# Patient Record
Sex: Female | Born: 1980 | Race: Black or African American | Hispanic: No | Smoking: Current every day smoker
Health system: Southern US, Community
[De-identification: ages and names within clinical notes are randomized; demographics above are authoritative.]

## PROBLEM LIST (undated history)

## (undated) DIAGNOSIS — F319 Bipolar disorder, unspecified: Secondary | ICD-10-CM

## (undated) DIAGNOSIS — I209 Angina pectoris, unspecified: Secondary | ICD-10-CM

## (undated) DIAGNOSIS — D219 Benign neoplasm of connective and other soft tissue, unspecified: Secondary | ICD-10-CM

## (undated) DIAGNOSIS — G43909 Migraine, unspecified, not intractable, without status migrainosus: Secondary | ICD-10-CM

## (undated) DIAGNOSIS — R7303 Prediabetes: Secondary | ICD-10-CM

## (undated) DIAGNOSIS — F329 Major depressive disorder, single episode, unspecified: Secondary | ICD-10-CM

## (undated) DIAGNOSIS — F32A Depression, unspecified: Secondary | ICD-10-CM

## (undated) DIAGNOSIS — R200 Anesthesia of skin: Secondary | ICD-10-CM

## (undated) DIAGNOSIS — R202 Paresthesia of skin: Secondary | ICD-10-CM

## (undated) DIAGNOSIS — L509 Urticaria, unspecified: Secondary | ICD-10-CM

## (undated) DIAGNOSIS — M797 Fibromyalgia: Secondary | ICD-10-CM

## (undated) HISTORY — DX: Prediabetes: R73.03

## (undated) HISTORY — DX: Anesthesia of skin: R20.2

## (undated) HISTORY — DX: Depression, unspecified: F32.A

## (undated) HISTORY — DX: Bipolar disorder, unspecified: F31.9

## (undated) HISTORY — DX: Benign neoplasm of connective and other soft tissue, unspecified: D21.9

## (undated) HISTORY — DX: Angina pectoris, unspecified: I20.9

## (undated) HISTORY — PX: OTHER SURGICAL HISTORY: SHX169

## (undated) HISTORY — DX: Major depressive disorder, single episode, unspecified: F32.9

## (undated) HISTORY — DX: Anesthesia of skin: R20.0

## (undated) HISTORY — DX: Urticaria, unspecified: L50.9

## (undated) HISTORY — PX: CARPAL TUNNEL RELEASE: SHX101

## (undated) HISTORY — DX: Migraine, unspecified, not intractable, without status migrainosus: G43.909

## (undated) HISTORY — DX: Fibromyalgia: M79.7

---

## 2013-04-23 LAB — OB RESULTS CONSOLE HIV ANTIBODY (ROUTINE TESTING): HIV: NONREACTIVE

## 2013-04-23 LAB — SICKLE CELL SCREEN: Sickle Cell Screen: NORMAL

## 2013-04-23 LAB — OB RESULTS CONSOLE GC/CHLAMYDIA
CHLAMYDIA, DNA PROBE: NEGATIVE
Gonorrhea: NEGATIVE

## 2013-04-23 LAB — OB RESULTS CONSOLE HGB/HCT, BLOOD
HCT: 35 %
Hemoglobin: 11.3 g/dL

## 2013-04-23 LAB — CYTOLOGY - PAP: Pap Smear: NORMAL

## 2013-04-23 LAB — OB RESULTS CONSOLE PLATELET COUNT: Platelets: 216 10*3/uL

## 2013-09-15 ENCOUNTER — Ambulatory Visit (INDEPENDENT_AMBULATORY_CARE_PROVIDER_SITE_OTHER): Payer: Self-pay | Admitting: Family Medicine

## 2013-09-15 ENCOUNTER — Encounter: Payer: Self-pay | Admitting: Obstetrics & Gynecology

## 2013-09-15 VITALS — BP 127/79 | Temp 97.8°F | Ht 60.0 in | Wt 157.7 lb

## 2013-09-15 DIAGNOSIS — O09893 Supervision of other high risk pregnancies, third trimester: Secondary | ICD-10-CM

## 2013-09-15 DIAGNOSIS — Z3490 Encounter for supervision of normal pregnancy, unspecified, unspecified trimester: Secondary | ICD-10-CM

## 2013-09-15 DIAGNOSIS — O09213 Supervision of pregnancy with history of pre-term labor, third trimester: Principal | ICD-10-CM

## 2013-09-15 DIAGNOSIS — Z3480 Encounter for supervision of other normal pregnancy, unspecified trimester: Secondary | ICD-10-CM

## 2013-09-15 DIAGNOSIS — O09219 Supervision of pregnancy with history of pre-term labor, unspecified trimester: Secondary | ICD-10-CM

## 2013-09-15 LAB — HIV ANTIBODY (ROUTINE TESTING W REFLEX): HIV: NONREACTIVE

## 2013-09-15 LAB — POCT URINALYSIS DIP (DEVICE)
Glucose, UA: NEGATIVE mg/dL
HGB URINE DIPSTICK: NEGATIVE
Nitrite: NEGATIVE
PH: 6 (ref 5.0–8.0)
Protein, ur: NEGATIVE mg/dL
Specific Gravity, Urine: >=1.03 (ref 1.005–1.030)
UROBILINOGEN UA: 1 mg/dL (ref 0.0–1.0)

## 2013-09-15 NOTE — Progress Notes (Signed)
  Subjective:    Tiffany Juarez is being seen today for her first obstetrical visit.She is a transfer of care from [facility] in [place].  Got care regularly there until 28 weeks when she moved.  No complications.  Does have a known uterine fibroid on her fundus.  This is a planned pregnancy. She is at [redacted]w[redacted]d gestation. Her obstetrical history is significant for PTD with first pregnancy at 32 weeks. She was induced at 32 weeks for severe pre-E. Relationship with FOB: significant other, not living together. Patient does intend to breast feed. Pregnancy history fully reviewed.  Patient reports left wrist pain. Feels like it is hard to pick up things. pain rigth at the base of her thumb. .  Review of Systems:   Review of Systems  see above   Objective:     BP 127/79  Temp(Src) 97.8 F (36.6 C)  Ht 5' (1.524 m)  Wt 157 lb 11.2 oz (71.532 kg)  BMI 30.80 kg/m2  LMP 02/03/2013 Physical Exam  Exam GENERAL: Well-developed, well-nourished female in no acute distress.  HEENT: Normocephalic, atraumatic. Sclerae anicteric.  NECK: Supple. Normal thyroid.  LUNGS: Clear to auscultation bilaterally.  HEART: Regular rate and rhythm. BREASTS: deferred ABDOMEN: Soft, nontender, nondistended. No organomegaly. PELVIC: deferred  EXTREMITIES: No cyanosis, clubbing, or edema, 2+ distal pulses. Left wrist- tender and mild swelling of brachioradialis.      Assessment:    Pregnancy: [id number] Patient Active Problem List   Diagnosis Date Noted  . History of preterm delivery, currently pregnant in third trimester 09/15/2013       Plan:     Records requested on 2/9 but still not here. Called clinic but had to leave a message. Hx of PTD but after induction for severe pre-E   Will order ob panel and 28 week labs.  US obtained previously so will wait to attempt to get records.   Wrist strain- recommend splinting  Prenatal vitamins. Problem list reviewed and updated.  Follow up in 2  weeks. 50% of 30 min visit spent on counseling and coordination of care.     Etsuko Dierolf L 09/15/2013

## 2013-09-15 NOTE — Patient Instructions (Signed)
Third Trimester of Pregnancy The third trimester is from week 29 through week 42, months 7 through 9. The third trimester is a time when the fetus is growing rapidly. At the end of the ninth month, the fetus is about 20 inches in length and weighs 6 10 pounds.  BODY CHANGES Your body goes through many changes during pregnancy. The changes vary from woman to woman.   Your weight will continue to increase. You can expect to gain 25 35 pounds (11 16 kg) by the end of the pregnancy.  You may begin to get stretch marks on your hips, abdomen, and breasts.  You may urinate more often because the fetus is moving lower into your pelvis and pressing on your bladder.  You may develop or continue to have heartburn as a result of your pregnancy.  You may develop constipation because certain hormones are causing the muscles that push waste through your intestines to slow down.  You may develop hemorrhoids or swollen, bulging veins (varicose veins).  You may have pelvic pain because of the weight gain and pregnancy hormones relaxing your joints between the bones in your pelvis. Back aches may result from over exertion of the muscles supporting your posture.  Your breasts will continue to grow and be tender. A yellow discharge may leak from your breasts called colostrum.  Your belly button may stick out.  You may feel short of breath because of your expanding uterus.  You may notice the fetus "dropping," or moving lower in your abdomen.  You may have a bloody mucus discharge. This usually occurs a few days to a week before labor begins.  Your cervix becomes thin and soft (effaced) near your due date. WHAT TO EXPECT AT YOUR PRENATAL EXAMS  You will have prenatal exams every 2 weeks until week 36. Then, you will have weekly prenatal exams. During a routine prenatal visit:  You will be weighed to make sure you and the fetus are growing normally.  Your blood pressure is taken.  Your abdomen will be  measured to track your baby's growth.  The fetal heartbeat will be listened to.  Any test results from the previous visit will be discussed.  You may have a cervical check near your due date to see if you have effaced. At around 36 weeks, your caregiver will check your cervix. At the same time, your caregiver will also perform a test on the secretions of the vaginal tissue. This test is to determine if a type of bacteria, Group B streptococcus, is present. Your caregiver will explain this further. Your caregiver may ask you:  What your birth plan is.  How you are feeling.  If you are feeling the baby move.  If you have had any abnormal symptoms, such as leaking fluid, bleeding, severe headaches, or abdominal cramping.  If you have any questions. Other tests or screenings that may be performed during your third trimester include:  Blood tests that check for low iron levels (anemia).  Fetal testing to check the health, activity level, and growth of the fetus. Testing is done if you have certain medical conditions or if there are problems during the pregnancy. FALSE LABOR You may feel small, irregular contractions that eventually go away. These are called Braxton Hicks contractions, or false labor. Contractions may last for hours, days, or even weeks before true labor sets in. If contractions come at regular intervals, intensify, or become painful, it is best to be seen by your caregiver.  SIGNS OF LABOR   Menstrual-like cramps.  Contractions that are 5 minutes apart or less.  Contractions that start on the top of the uterus and spread down to the lower abdomen and back.  A sense of increased pelvic pressure or back pain.  A watery or bloody mucus discharge that comes from the vagina. If you have any of these signs before the 37th week of pregnancy, call your caregiver right away. You need to go to the hospital to get checked immediately. HOME CARE INSTRUCTIONS   Avoid all  smoking, herbs, alcohol, and unprescribed drugs. These chemicals affect the formation and growth of the baby.  Follow your caregiver's instructions regarding medicine use. There are medicines that are either safe or unsafe to take during pregnancy.  Exercise only as directed by your caregiver. Experiencing uterine cramps is a good sign to stop exercising.  Continue to eat regular, healthy meals.  Wear a good support bra for breast tenderness.  Do not use hot tubs, steam rooms, or saunas.  Wear your seat belt at all times when driving.  Avoid raw meat, uncooked cheese, cat litter boxes, and soil used by cats. These carry germs that can cause birth defects in the baby.  Take your prenatal vitamins.  Try taking a stool softener (if your caregiver approves) if you develop constipation. Eat more high-fiber foods, such as fresh vegetables or fruit and whole grains. Drink plenty of fluids to keep your urine clear or pale yellow.  Take warm sitz baths to soothe any pain or discomfort caused by hemorrhoids. Use hemorrhoid cream if your caregiver approves.  If you develop varicose veins, wear support hose. Elevate your feet for 15 minutes, 3 4 times a day. Limit salt in your diet.  Avoid heavy lifting, wear low heal shoes, and practice good posture.  Rest a lot with your legs elevated if you have leg cramps or low back pain.  Visit your dentist if you have not gone during your pregnancy. Use a soft toothbrush to brush your teeth and be gentle when you floss.  A sexual relationship may be continued unless your caregiver directs you otherwise.  Do not travel far distances unless it is absolutely necessary and only with the approval of your caregiver.  Take prenatal classes to understand, practice, and ask questions about the labor and delivery.  Make a trial run to the hospital.  Pack your hospital bag.  Prepare the baby's nursery.  Continue to go to all your prenatal visits as directed  by your caregiver. SEEK MEDICAL CARE IF:  You are unsure if you are in labor or if your water has broken.  You have dizziness.  You have mild pelvic cramps, pelvic pressure, or nagging pain in your abdominal area.  You have persistent nausea, vomiting, or diarrhea.  You have a bad smelling vaginal discharge.  You have pain with urination. SEEK IMMEDIATE MEDICAL CARE IF:   You have a fever.  You are leaking fluid from your vagina.  You have spotting or bleeding from your vagina.  You have severe abdominal cramping or pain.  You have rapid weight loss or gain.  You have shortness of breath with chest pain.  You notice sudden or extreme swelling of your face, hands, ankles, feet, or legs.  You have not felt your baby move in over an hour.  You have severe headaches that do not go away with medicine.  You have vision changes. Document Released: 07/03/2001 Document Revised: 03/11/2013 Document Reviewed:   You have severe abdominal cramping or pain.   You have rapid weight loss or gain.   You have shortness of breath with chest pain.   You notice sudden or extreme swelling of your face, hands, ankles, feet, or legs.   You have not felt your baby move in over an hour.   You have severe headaches that do not go away with medicine.   You have vision changes.  Document Released: 07/03/2001 Document Revised: 03/11/2013 Document Reviewed: 09/09/2012  [employer] Patient Information 2014 [employer].

## 2013-09-15 NOTE — Progress Notes (Signed)
P=92  Initial OB transfer from [place], records requested.

## 2013-09-16 LAB — OBSTETRIC PANEL
Antibody Screen: NEGATIVE
BASOS PCT: 0 % (ref 0–1)
Basophils Absolute: 0 10*3/uL (ref 0.0–0.1)
EOS PCT: 2 % (ref 0–5)
Eosinophils Absolute: 0.2 10*3/uL (ref 0.0–0.7)
HCT: 32.7 % — ABNORMAL LOW (ref 36.0–46.0)
HEP B S AG: NEGATIVE
Hemoglobin: 11.1 g/dL — ABNORMAL LOW (ref 12.0–15.0)
LYMPHS ABS: 1.7 10*3/uL (ref 0.7–4.0)
Lymphocytes Relative: 17 % (ref 12–46)
MCH: 28.2 pg (ref 26.0–34.0)
MCHC: 33.9 g/dL (ref 30.0–36.0)
MCV: 83 fL (ref 78.0–100.0)
MONOS PCT: 6 % (ref 3–12)
Monocytes Absolute: 0.6 10*3/uL (ref 0.1–1.0)
Neutro Abs: 7.6 10*3/uL (ref 1.7–7.7)
Neutrophils Relative %: 75 % (ref 43–77)
PLATELETS: 177 10*3/uL (ref 150–400)
RBC: 3.94 MIL/uL (ref 3.87–5.11)
RDW: 14.7 % (ref 11.5–15.5)
RH TYPE: POSITIVE
Rubella: 1.6 Index — ABNORMAL HIGH (ref <0.90)
WBC: 10.1 10*3/uL (ref 4.0–10.5)

## 2013-09-16 LAB — PRESCRIPTION MONITORING PROFILE (19 PANEL)
Amphetamine/Meth: NEGATIVE ng/mL
Barbiturate Screen, Urine: NEGATIVE ng/mL
Benzodiazepine Screen, Urine: NEGATIVE ng/mL
Buprenorphine, Urine: NEGATIVE ng/mL
CARISOPRODOL, URINE: NEGATIVE ng/mL
CREATININE, URINE: 264.26 mg/dL (ref >20.0)
Cannabinoid Scrn, Ur: NEGATIVE ng/mL
Cocaine Metabolites: NEGATIVE ng/mL
Fentanyl, Ur: NEGATIVE ng/mL
MDMA URINE: NEGATIVE ng/mL
MEPERIDINE UR: NEGATIVE ng/mL
Methadone Screen, Urine: NEGATIVE ng/mL
Methaqualone: NEGATIVE ng/mL
NITRITES URINE, INITIAL: NEGATIVE ug/mL
Opiate Screen, Urine: NEGATIVE ng/mL
Oxycodone Screen, Ur: NEGATIVE ng/mL
PH URINE, INITIAL: 6 pH (ref 4.5–8.9)
PROPOXYPHENE: NEGATIVE ng/mL
Phencyclidine, Ur: NEGATIVE ng/mL
TRAMADOL UR: NEGATIVE ng/mL
Tapentadol, urine: NEGATIVE ng/mL
Zolpidem, Urine: NEGATIVE ng/mL

## 2013-09-16 LAB — GLUCOSE TOLERANCE, 1 HOUR (50G) W/O FASTING: GLUCOSE 1 HOUR GTT: 118 mg/dL (ref 70–140)

## 2013-09-18 ENCOUNTER — Encounter: Payer: Self-pay | Admitting: Family Medicine

## 2013-09-21 ENCOUNTER — Encounter: Payer: Self-pay | Admitting: *Deleted

## 2013-09-22 ENCOUNTER — Encounter: Payer: Self-pay | Admitting: Obstetrics & Gynecology

## 2013-09-29 ENCOUNTER — Encounter: Payer: Self-pay | Admitting: Obstetrics & Gynecology

## 2013-09-29 ENCOUNTER — Encounter: Payer: Self-pay | Admitting: *Deleted

## 2013-12-16 ENCOUNTER — Encounter: Payer: Self-pay | Admitting: General Practice

## 2014-05-24 ENCOUNTER — Encounter: Payer: Self-pay | Admitting: Obstetrics & Gynecology

## 2014-07-21 ENCOUNTER — Encounter (HOSPITAL_COMMUNITY): Payer: Self-pay | Admitting: *Deleted

## 2016-08-14 ENCOUNTER — Emergency Department (HOSPITAL_COMMUNITY)
Admission: EM | Admit: 2016-08-14 | Discharge: 2016-08-14 | Disposition: A | Discharge status: Home / Self Care | Payer: Self-pay | Attending: Dermatology | Admitting: Dermatology

## 2016-08-14 ENCOUNTER — Encounter (HOSPITAL_COMMUNITY): Payer: Self-pay | Admitting: Emergency Medicine

## 2016-08-14 DIAGNOSIS — F17210 Nicotine dependence, cigarettes, uncomplicated: Secondary | ICD-10-CM | POA: Insufficient documentation

## 2016-08-14 DIAGNOSIS — J029 Acute pharyngitis, unspecified: Secondary | ICD-10-CM | POA: Insufficient documentation

## 2016-08-14 DIAGNOSIS — Z5321 Procedure and treatment not carried out due to patient leaving prior to being seen by health care provider: Secondary | ICD-10-CM | POA: Insufficient documentation

## 2016-08-14 NOTE — ED Notes (Signed)
Offered patient ibuprofen.  Patient states she just took 800mg  at home before arrival.

## 2016-08-14 NOTE — ED Triage Notes (Signed)
Pt presents to ED for assessment of multiple symptoms x 1 month:  Neck pain, right and left shoulder pain, throat pain, bilateral ear pain, intermittent "hive" type skin irritation around belt area, random severe itching on palms or soles of feet, and generalized skin soreness.  Pt states difficulty sleeping due to pain.

## 2016-09-02 ENCOUNTER — Emergency Department (HOSPITAL_COMMUNITY)
Admission: EM | Admit: 2016-09-02 | Discharge: 2016-09-02 | Disposition: A | Discharge status: Home / Self Care | Payer: Self-pay | Attending: Dermatology | Admitting: Dermatology

## 2016-09-02 ENCOUNTER — Encounter (HOSPITAL_COMMUNITY): Payer: Self-pay | Admitting: Emergency Medicine

## 2016-09-02 DIAGNOSIS — M79672 Pain in left foot: Secondary | ICD-10-CM | POA: Insufficient documentation

## 2016-09-02 DIAGNOSIS — F17210 Nicotine dependence, cigarettes, uncomplicated: Secondary | ICD-10-CM | POA: Insufficient documentation

## 2016-09-02 DIAGNOSIS — M79671 Pain in right foot: Secondary | ICD-10-CM | POA: Insufficient documentation

## 2016-09-02 DIAGNOSIS — Z5321 Procedure and treatment not carried out due to patient leaving prior to being seen by health care provider: Secondary | ICD-10-CM | POA: Insufficient documentation

## 2016-09-02 NOTE — ED Triage Notes (Signed)
Pt c/o knots on bil feet off and on x's 2 months.  St's they come and go.  Pt c/o pain to these areas.  St's painful to walk

## 2016-09-02 NOTE — ED Notes (Signed)
Patient inquiring about wait time. Updated on current wait time. RN also examined feet, they are warm to touch. Unable to palpate pulses because patient states it hurt too bad for her feet to be touched.

## 2016-09-02 NOTE — ED Notes (Signed)
Pt at desk stating she is going to leave and go to another ED. VSS and patient alert and able to make health decisions.

## 2016-09-11 ENCOUNTER — Encounter (HOSPITAL_COMMUNITY): Payer: Self-pay | Admitting: Emergency Medicine

## 2016-09-11 ENCOUNTER — Ambulatory Visit (HOSPITAL_COMMUNITY)
Admission: EM | Admit: 2016-09-11 | Discharge: 2016-09-11 | Disposition: A | Discharge status: Home / Self Care | Payer: Self-pay | Attending: Family Medicine | Admitting: Family Medicine

## 2016-09-11 DIAGNOSIS — R229 Localized swelling, mass and lump, unspecified: Secondary | ICD-10-CM

## 2016-09-11 DIAGNOSIS — L509 Urticaria, unspecified: Secondary | ICD-10-CM

## 2016-09-11 DIAGNOSIS — M2550 Pain in unspecified joint: Secondary | ICD-10-CM

## 2016-09-11 LAB — POCT URINALYSIS DIP (DEVICE)
BILIRUBIN URINE: NEGATIVE
GLUCOSE, UA: NEGATIVE mg/dL
HGB URINE DIPSTICK: NEGATIVE
Ketones, ur: NEGATIVE mg/dL
LEUKOCYTES UA: NEGATIVE
NITRITE: NEGATIVE
Protein, ur: NEGATIVE mg/dL
Specific Gravity, Urine: 1.015 (ref 1.005–1.030)
UROBILINOGEN UA: 0.2 mg/dL (ref 0.0–1.0)
pH: 7 (ref 5.0–8.0)

## 2016-09-11 MED ORDER — PREDNISONE 50 MG PO TABS: ORAL_TABLET | ORAL | 0 refills | Status: DC

## 2016-09-11 NOTE — Discharge Instructions (Signed)
Its not certain as to what the specific reason is for some your symptoms, rash and painful nodules. I am concerned about a possible autoimmune condition. You need to have specific testing that we do not have available at this facility. Please follow-up with the urgent care and which you have been before and that had these types of testing and primary care soon as possible. You had been given a prescription for steroids today and would recommend taking an over-the-counter nonsedating antihistamine such as Zyrtec or Allegra.

## 2016-09-11 NOTE — ED Provider Notes (Signed)
CSN: [id number]     Arrival date & time 09/11/16  1011 History   First MD Initiated Contact with Patient 09/11/16 1116     Chief Complaint  Patient presents with  . Urticaria  . Back Pain   (Consider location/radiation/quality/duration/timing/severity/associated sxs/prior Treatment) 36 year old female who has been dealing with a type of rash where she describes as "whelps", red raised wheals that often form tender, painful "knots" under the skin. She states some are flat and raised, appearing in one area and then disappearing and then reappearing in another. She has 2 or 3 areas now with air is a subcutaneous nodular-like area with surrounding tenderness. One is to the left upper most buttock and the other is to the right heel. She also develops periodic joint pains/ polyarthralgias, myalgias. She states this started in her hands a few months ago where she was developed scaling erythematous plaques and nodules. They were painful. She was treated with topical steroids that did not help at that time.      Past Medical History:  Diagnosis Date  . Anginal pain ([place])   . Fibroid    Past Surgical History:  Procedure Laterality Date  . CESAREAN SECTION     Family History  Problem Relation Age of Onset  . Depression Father   . Depression Brother    Social History  Substance Use Topics  . Smoking status: Current Every Day Smoker    Packs/day: 0.50    Types: Cigarettes  . Smokeless tobacco: Never Used  . Alcohol use No   OB History    Gravida Para Term Preterm AB Living   5 2 1 1 2 2    SAB TAB Ectopic Multiple Live Births   1 1     2      Review of Systems  Constitutional: Positive for activity change. Negative for fever.  HENT: Negative.   Eyes: Negative.   Respiratory: Negative.   Cardiovascular: Negative for chest pain and leg swelling.  Gastrointestinal: Negative.   Endocrine: Negative.   Genitourinary: Negative for dysuria, frequency, pelvic pain and vaginal discharge.   Musculoskeletal: Positive for arthralgias and myalgias.  Skin: Positive for rash.  Neurological: Negative.   Psychiatric/Behavioral: Negative.   All other systems reviewed and are negative.   Allergies  Cephalosporins and Penicillins  Home Medications   Prior to Admission medications   Medication Sig Start Date End Date Taking? Authorizing Provider  predniSONE (DELTASONE) 50 MG tablet 1 tab po daily for 6 days. Take with food. 09/11/16   Janne Napoleon, NP   Meds Ordered and Administered this Visit  Medications - No data to display  BP 107/62 (BP Location: Right Arm)   Pulse 74   Temp 98.5 F (36.9 C) (Oral)   Resp 18   LMP 09/11/2016 (Exact Date)   SpO2 100%   Breastfeeding? No  No data found.   Physical Exam  Constitutional: She is oriented to person, place, and time. She appears well-developed and well-nourished. No distress.  HENT:  Head: Normocephalic and atraumatic.  Eyes: Conjunctivae and EOM are normal.  Neck: Normal range of motion. Neck supple.  Cardiovascular: Normal rate and regular rhythm.   Pulmonary/Chest: Effort normal. No respiratory distress.  Musculoskeletal: Normal range of motion. She exhibits no edema.  There is a small tender barely visible lesion to the heel of the right foot there is tenderness along the edge of the heel and just distal to the Achilles tendon. Minimal erythema and a small area does not  appear to be cellulitis.  There is a small tender, annular, lesion to the left upper most buttock adjacent to the cleft which is also TTP. No erythema. No muscular or bony tenderness.  Lymphadenopathy:    She has no cervical adenopathy.  Neurological: She is alert and oriented to person, place, and time. She exhibits normal muscle tone.  Skin: Skin is warm and dry. Capillary refill takes less than 2 seconds. She is not diaphoretic.  At this time really do not identify a rash. No urticaria.  Psychiatric: She has a normal mood and affect.  Nursing  note and vitals reviewed.   Urgent Care Course     Procedures (including critical care time)  Labs Review Labs Reviewed  POCT URINALYSIS DIP (DEVICE)    Imaging Review No results found.   Visual Acuity Review  Right Eye Distance:   Left Eye Distance:   Bilateral Distance:    Right Eye Near:   Left Eye Near:    Bilateral Near:         MDM   1. Urticaria   2. Multiple skin nodules   3. Polyarthralgia    Its not certain as to what the specific reason is for some your symptoms, rash and painful nodules. I am concerned about a possible autoimmune condition. You need to have specific testing that we do not have available at this facility. Please follow-up with the urgent care and which you have been before and that had these types of testing and primary care soon as possible. You had been given a prescription for steroids today and would recommend taking an over-the-counter nonsedating antihistamine such as Zyrtec or Allegra. Meds ordered this encounter  Medications  . predniSONE (DELTASONE) 50 MG tablet    Sig: 1 tab po daily for 6 days. Take with food.    Dispense:  6 tablet    Refill:  0    Order Specific Question:   Supervising Provider    Answer:   Billy Fischer [[id number]]        Janne Napoleon, NP 09/11/16 1152    Janne Napoleon, NP 09/11/16 1155

## 2016-09-11 NOTE — ED Triage Notes (Signed)
The patient presented to the [facility] with a complaint of recurrent hives on her body x 3 months. The patient also reported low back pain that she stated was previously diagnosed as a kidney infection.

## 2017-02-28 ENCOUNTER — Ambulatory Visit (INDEPENDENT_AMBULATORY_CARE_PROVIDER_SITE_OTHER): Payer: Self-pay | Admitting: Physician Assistant

## 2017-03-01 ENCOUNTER — Encounter (INDEPENDENT_AMBULATORY_CARE_PROVIDER_SITE_OTHER): Payer: Self-pay | Admitting: Physician Assistant

## 2017-03-01 ENCOUNTER — Ambulatory Visit (INDEPENDENT_AMBULATORY_CARE_PROVIDER_SITE_OTHER): Payer: Medicare Other | Admitting: Physician Assistant

## 2017-03-01 VITALS — BP 116/78 | HR 83 | Temp 98.3°F | Ht 60.63 in | Wt 147.6 lb

## 2017-03-01 DIAGNOSIS — G894 Chronic pain syndrome: Secondary | ICD-10-CM

## 2017-03-01 DIAGNOSIS — R609 Edema, unspecified: Secondary | ICD-10-CM

## 2017-03-01 DIAGNOSIS — F319 Bipolar disorder, unspecified: Secondary | ICD-10-CM | POA: Diagnosis not present

## 2017-03-01 MED ORDER — RISPERIDONE 0.5 MG PO TABS: 0.5000 mg | ORAL_TABLET | Freq: Every day | ORAL | 1 refills | Status: DC

## 2017-03-01 NOTE — Progress Notes (Signed)
Constant headaches Breaking out up under skin, itches  Concentrated pain across shoulders lower back behind knees lasts for about a week  Vision problems

## 2017-03-01 NOTE — Progress Notes (Signed)
Subjective:  Patient ID: Tiffany Juarez, female    DOB: Sep 23, 1980  Age: 36 y.o. MRN: [id number]  CC: pain all over body  HPI Pixie Burgener is a 36 y.o. female with a PMH of bipolar disorder, depression, anginal pain, and fibroid presents with chronic generalized pain all over body. Onset after pregnancy 3 years ago. There is also outbreak of hard papules approximately twice per month. Has occasional and random swelling of the face and soles of feet. Previously prescribed prednisone at an outside clinic and felt she got worse. Has recently noticed that periods are becoming very painful and heavy. No history of endometriosis. Does not endorse CP, palpitations, SOB, HA, abdominal pain, f/c/n/v, or GI/GU sxs.    Outpatient Medications Prior to Visit  Medication Sig Dispense Refill  . predniSONE (DELTASONE) 50 MG tablet 1 tab po daily for 6 days. Take with food. (Patient not taking: Reported on 03/01/2017) 6 tablet 0   No facility-administered medications prior to visit.      ROS Review of Systems  Constitutional: Negative for chills, fever and malaise/fatigue.  Eyes: Negative for blurred vision.  Respiratory: Negative for shortness of breath.   Cardiovascular: Negative for chest pain and palpitations.  Gastrointestinal: Negative for abdominal pain and nausea.  Genitourinary: Negative for dysuria and hematuria.  Musculoskeletal: Positive for joint pain and myalgias.  Skin: Negative for rash.  Neurological: Negative for tingling and headaches.  Psychiatric/Behavioral: Negative for depression. The patient is not nervous/anxious.     Objective:  BP 116/78 (BP Location: Right Arm, Patient Position: Sitting, Cuff Size: Normal)   Pulse 83   Temp 98.3 F (36.8 C) (Oral)   Ht 5' 0.63" (1.54 m)   Wt 147 lb 9.6 oz (67 kg)   LMP 02/23/2017 (Exact Date)   SpO2 100%   BMI 28.23 kg/m   BP/Weight 03/01/2017 09/11/2016 02/04/9677  Systolic BP 938 101 751  Diastolic BP 78 62 72  Wt. (Lbs) 147.6 -  145  BMI 28.23 - 28.32      Physical Exam  Constitutional: She is oriented to person, place, and time.  Well developed, well nourished, in discomfort, polite  HENT:  Head: Normocephalic and atraumatic.  Eyes: No scleral icterus.  Neck: Normal range of motion. Neck supple. No thyromegaly present.  Cardiovascular: Normal rate, regular rhythm and normal heart sounds.   Pulmonary/Chest: Effort normal and breath sounds normal.  Abdominal: Soft. Bowel sounds are normal. There is tenderness (lower abdominal TTP ).  Musculoskeletal: She exhibits tenderness (bilateral lateral hip pain). She exhibits no edema or deformity.  No pain in the typical trigger points of fibromyalgia besides the hips.  Neurological: She is alert and oriented to person, place, and time. No cranial nerve deficit. Coordination normal.  Skin: Skin is warm and dry. No rash noted. No erythema. No pallor.  Psychiatric: She has a normal mood and affect. Her behavior is normal. Thought content normal.  Vitals reviewed.    Assessment & Plan:    1. Chronic pain syndrome - CBC with Differential - Comprehensive metabolic panel - TSH - ANA w/Reflex - Sedimentation Rate - C-reactive protein - Allergens, Zone 2 - Allergen Profile, Basic Food  2. Edema, unspecified type - CBC with Differential - Comprehensive metabolic panel - TSH - ANA w/Reflex - Sedimentation Rate - C-reactive protein - Allergens, Zone 2 - Allergen Profile, Basic Food  3. Bipolar affective disorder, remission status unspecified (HCC) - Begin risperiDONE (RISPERDAL) 0.5 MG tablet; Take 1 tablet (0.5 mg total)  by mouth at bedtime.  Dispense: 30 tablet; Refill: 1 - Patient plans to make appt with psychiatry once her insurance is approved.  Meds ordered this encounter  Medications  . risperiDONE (RISPERDAL) 0.5 MG tablet    Sig: Take 1 tablet (0.5 mg total) by mouth at bedtime.    Dispense:  30 tablet    Refill:  1    Order Specific Question:    Supervising Provider    Answer:   Tresa Garter [id number]    Follow-up: Return in about 4 weeks (around 03/29/2017).   Clent Demark PA

## 2017-03-01 NOTE — Patient Instructions (Signed)

## 2017-03-04 LAB — CBC WITH DIFFERENTIAL/PLATELET
BASOS: 0 %
Basophils Absolute: 0 10*3/uL (ref 0.0–0.2)
EOS (ABSOLUTE): 0.4 10*3/uL (ref 0.0–0.4)
EOS: 6 %
HEMATOCRIT: 42.9 % (ref 34.0–46.6)
HEMOGLOBIN: 13.2 g/dL (ref 11.1–15.9)
Immature Grans (Abs): 0 10*3/uL (ref 0.0–0.1)
Immature Granulocytes: 0 %
Lymphocytes Absolute: 2.3 10*3/uL (ref 0.7–3.1)
Lymphs: 35 %
MCH: 26.8 pg (ref 26.6–33.0)
MCHC: 30.8 g/dL — AB (ref 31.5–35.7)
MCV: 87 fL (ref 79–97)
MONOCYTES: 8 %
MONOS ABS: 0.5 10*3/uL (ref 0.1–0.9)
NEUTROS ABS: 3.2 10*3/uL (ref 1.4–7.0)
Neutrophils: 51 %
Platelets: 261 10*3/uL (ref 150–379)
RBC: 4.92 x10E6/uL (ref 3.77–5.28)
RDW: 15.6 % — AB (ref 12.3–15.4)
WBC: 6.4 10*3/uL (ref 3.4–10.8)

## 2017-03-04 LAB — ALLERGEN PROFILE, BASIC FOOD
Allergen Corn, IgE: <0.1 kU/L
Chocolate/Cacao IgE: <0.1 kU/L
Food Mix (Seafoods) IgE: NEGATIVE
Milk IgE: <0.1 kU/L
Peanut IgE: <0.1 kU/L
Soybean IgE: <0.1 kU/L
Wheat IgE: 0.18 kU/L — AB

## 2017-03-04 LAB — COMPREHENSIVE METABOLIC PANEL
A/G RATIO: 1.7 (ref 1.2–2.2)
ALBUMIN: 4.5 g/dL (ref 3.5–5.5)
ALK PHOS: 86 IU/L (ref 39–117)
ALT: 13 IU/L (ref 0–32)
AST: 17 IU/L (ref 0–40)
BUN/Creatinine Ratio: 12 (ref 9–23)
BUN: 11 mg/dL (ref 6–20)
CO2: 23 mmol/L (ref 20–29)
CREATININE: 0.95 mg/dL (ref 0.57–1.00)
Calcium: 9.8 mg/dL (ref 8.7–10.2)
Chloride: 104 mmol/L (ref 96–106)
GFR calc Af Amer: 90 mL/min/{1.73_m2} (ref >59)
GFR calc non Af Amer: 78 mL/min/{1.73_m2} (ref >59)
GLOBULIN, TOTAL: 2.7 g/dL (ref 1.5–4.5)
Glucose: 61 mg/dL — ABNORMAL LOW (ref 65–99)
POTASSIUM: 4.6 mmol/L (ref 3.5–5.2)
SODIUM: 143 mmol/L (ref 134–144)
Total Protein: 7.2 g/dL (ref 6.0–8.5)

## 2017-03-04 LAB — ALLERGENS, ZONE 2
Alternaria Alternata IgE: <0.1 kU/L
Cedar, Mountain IgE: <0.1 kU/L
Cockroach, American IgE: <0.1 kU/L
D001-IGE D PTERONYSSINUS: 0.17 kU/L — AB
D002-IGE D FARINAE: 0.18 kU/L — AB
Elm, American IgE: <0.1 kU/L
Hickory, White IgE: <0.1 kU/L
Mugwort IgE Qn: <0.1 kU/L
Nettle IgE: <0.1 kU/L
Oak, White IgE: <0.1 kU/L
Penicillium Chrysogen IgE: <0.1 kU/L
Pigweed, Rough IgE: <0.1 kU/L
Plantain, English IgE: <0.1 kU/L
Ragweed, Short IgE: <0.1 kU/L
Sheep Sorrel IgE Qn: <0.1 kU/L
Timothy Grass IgE: <0.1 kU/L

## 2017-03-04 LAB — C-REACTIVE PROTEIN: CRP: 2.9 mg/L (ref 0.0–4.9)

## 2017-03-04 LAB — ANA W/REFLEX: ANA: NEGATIVE

## 2017-03-04 LAB — SEDIMENTATION RATE: Sed Rate: 7 mm/hr (ref 0–32)

## 2017-03-04 LAB — TSH: TSH: 0.935 u[IU]/mL (ref 0.450–4.500)

## 2017-03-07 ENCOUNTER — Telehealth (INDEPENDENT_AMBULATORY_CARE_PROVIDER_SITE_OTHER): Payer: Self-pay | Admitting: Physician Assistant

## 2017-03-07 NOTE — Telephone Encounter (Signed)
Tramadol and Tylenol 3 for acute pains. This patient has chronic pains but I will see her.

## 2017-03-07 NOTE — Telephone Encounter (Signed)
Pt called requesting a different medicine than Ibuprofen she is having hips,lower back and a pain between her shoulder blades . Please, call her back  Thank you

## 2017-03-07 NOTE — Telephone Encounter (Signed)
Patient would like tramadol, explained that if given it would only be given for a few days and she may need appointment to discuss with provider. Patient expressed understanding. Nat Christen, CMA

## 2017-03-08 NOTE — Telephone Encounter (Signed)
appt scheduled. Nat Christen, CMA

## 2017-03-15 ENCOUNTER — Ambulatory Visit (INDEPENDENT_AMBULATORY_CARE_PROVIDER_SITE_OTHER): Payer: Medicare Other | Admitting: Physician Assistant

## 2017-03-15 ENCOUNTER — Encounter (INDEPENDENT_AMBULATORY_CARE_PROVIDER_SITE_OTHER): Payer: Self-pay | Admitting: Physician Assistant

## 2017-03-15 VITALS — BP 111/76 | HR 84 | Temp 97.8°F | Wt 150.4 lb

## 2017-03-15 DIAGNOSIS — R5383 Other fatigue: Secondary | ICD-10-CM

## 2017-03-15 DIAGNOSIS — F319 Bipolar disorder, unspecified: Secondary | ICD-10-CM

## 2017-03-15 DIAGNOSIS — G4700 Insomnia, unspecified: Secondary | ICD-10-CM | POA: Diagnosis not present

## 2017-03-15 DIAGNOSIS — G8929 Other chronic pain: Secondary | ICD-10-CM | POA: Diagnosis not present

## 2017-03-15 DIAGNOSIS — Z91018 Allergy to other foods: Secondary | ICD-10-CM

## 2017-03-15 DIAGNOSIS — M545 Low back pain: Secondary | ICD-10-CM | POA: Diagnosis not present

## 2017-03-15 LAB — POCT URINALYSIS DIPSTICK
BILIRUBIN UA: NEGATIVE
GLUCOSE UA: NEGATIVE
Ketones, UA: NEGATIVE
Nitrite, UA: NEGATIVE
Protein, UA: NEGATIVE
RBC UA: NEGATIVE
SPEC GRAV UA: 1.02 (ref 1.010–1.025)
Urobilinogen, UA: 0.2 E.U./dL
pH, UA: 5.5 (ref 5.0–8.0)

## 2017-03-15 MED ORDER — VITAMIN D3 125 MCG (5000 UT) PO CAPS: 1.0000 | ORAL_CAPSULE | Freq: Every day | ORAL | 0 refills | Status: DC

## 2017-03-15 MED ORDER — MELOXICAM 7.5 MG PO TABS: 7.5000 mg | ORAL_TABLET | Freq: Every day | ORAL | 0 refills | Status: DC

## 2017-03-15 MED ORDER — HYDROXYZINE HCL 25 MG PO TABS: 25.0000 mg | ORAL_TABLET | Freq: Three times a day (TID) | ORAL | 0 refills | Status: DC | PRN

## 2017-03-15 MED ORDER — RISPERIDONE 1 MG PO TABS: 1.0000 mg | ORAL_TABLET | Freq: Every day | ORAL | 5 refills | Status: DC

## 2017-03-15 MED ORDER — VITAMIN B-12 100 MCG PO TABS: 100.0000 ug | ORAL_TABLET | Freq: Every day | ORAL | 0 refills | Status: AC

## 2017-03-15 NOTE — Patient Instructions (Signed)
Food Allergy A food allergy is an abnormal reaction to a food (food allergen) by the body's defense system (immune system). Foods that commonly cause allergies are:  Milk.  Seafood.  Eggs.  Nuts.  Wheat.  Soy.  What are the causes? Food allergies happen when the immune system mistakenly sees a food as harmful and releases antibodies to fight it. What are the signs or symptoms? Symptoms may be mild or severe. They usually start minutes after the food is eaten, but they can occur even a few hours later. In people with a severe allergy, symptoms can start within seconds. Mild symptoms  Nasal congestion.  Tingling in the mouth.  An itchy, red rash.  Vomiting.  Diarrhea. Severe symptoms  Swelling of the lips, face, and tongue.  Swelling of the back of the mouth and throat.  Wheezing.  A hoarse voice.  Itchy, red, swollen areas of skin (hives).  Dizziness or light-headedness.  Fainting.  Trouble breathing, speaking, or swallowing.  Chest tightness.  Rapid heartbeat. How is this diagnosed? A diagnosis is made with a physical exam, medical and family history, and one or more of the following:  Skin tests.  Blood tests.  A food diary.  The results of an elimination diet. The elimination diet involves removing foods from your diet and then adding them back in, one at a time.  How is this treated? There is no cure for allergies. An allergic reaction can be treated with medicines, such as:  Antihistamines.  Steroids.  Respiratory inhalers.  Epinephrine.  Severe symptoms can be a sign of a life-threatening reaction called anaphylaxis, and they require immediate treatment. Severe reactions usually need to be treated at a hospital. People who have had a severe reaction may be prescribed rescue medicines to take if they are accidentally exposed to an allergen. Follow these instructions at home: General instructions  Avoid the foods that you are allergic  to.  Read food labels before you eat packaged items. Look for ingredients you are allergic to.  When you are at a restaurant, tell your server that you have an allergy. If you are unsure of whether a meal has an ingredient that you are allergic to, ask your server.  Take medicines only as directed by your health care provider. Do not drive until the medicine has worn off, unless your health care provider gives you approval.  Inform all health care providers that you have a food allergy.  Contact your health care provider if you want to be tested for an allergy. If you have had an anaphylactic reaction before, you should never test yourself for an allergy without your health care provider's approval. Instructions for People with Severe Allergies  Wear a medical alert bracelet or necklace that describes your allergy.  Carry your anaphylaxis kit or an epinephrine injection with you at all times. Use them as directed by your health care provider.  Make sure that you, your family members, and your employer know: ? How to use an anaphylaxis kit. ? How to give an epinephrine injection.  Replace your epinephrine immediately after use, in case you have another reaction.  Seek medical care even after you take epinephrine. This is important because epinephrine can be followed by a delayed, life-threatening reaction. Instructions for People with a Potential Allergy  Follow the elimination diet as directed by your health care provider.  Keep a food diary as directed by your health care provider. Every day, write down: ? What you eat and   drink and when. ? What symptoms you have and when. Contact a health care provider if:  Your symptoms have not gone away within 2 days.  Your symptoms get worse.  You develop new symptoms. Get help right away if:  You use epinephrine.  You are having a severe allergic reaction. Symptoms of a severe reaction include: ? Swelling of the lips, face, and  tongue. ? Swelling of the back of the mouth and throat. ? Wheezing. ? A hoarse voice. ? Hives. ? Dizziness or light-headedness. ? Fainting. ? Trouble breathing, speaking, or swallowing. ? Chest tightness. ? Rapid heartbeat. This information is not intended to replace advice given to you by your health care provider. Make sure you discuss any questions you have with your health care provider. Document Released: 07/06/2000 Document Revised: 12/06/2015 Document Reviewed: 04/20/2014 Elsevier Interactive Patient Education  2018 Else[employer] Inc.  

## 2017-03-15 NOTE — Progress Notes (Signed)
Subjective:  Patient ID: Tiffany Juarez, female    DOB: 1981-03-30  Age: 36 y.o. MRN: [id number]  CC: back pain  HPI Tiffany Juarez is a 36 y.o. female with a PMH of bipolar disorder, depression, anginal pain, and fibroid presents with chronic generalized pain all over body.  Onset after pregnancy 3 years ago. Laboratory testing conducted at last visit on 03/01/17 was positive for very low allergy to wheat. Negative for several other tests.     Back pain onset November 2017. Location right lower back. Described as a dull and constant pain.      Bipolar is reportedly "a little" better. She is able to sleep better but still have some difficulty.      Reports constant fatigue. Does not have the energy to get out of bed.    Outpatient Medications Prior to Visit  Medication Sig Dispense Refill  . predniSONE (DELTASONE) 50 MG tablet 1 tab po daily for 6 days. Take with food. (Patient not taking: Reported on 03/01/2017) 6 tablet 0  . risperiDONE (RISPERDAL) 0.5 MG tablet Take 1 tablet (0.5 mg total) by mouth at bedtime. 30 tablet 1   No facility-administered medications prior to visit.      ROS Review of Systems  Constitutional: Positive for malaise/fatigue. Negative for chills and fever.  Eyes: Negative for blurred vision.  Respiratory: Negative for shortness of breath.   Cardiovascular: Negative for chest pain and palpitations.  Gastrointestinal: Negative for abdominal pain and nausea.  Genitourinary: Negative for dysuria and hematuria.  Musculoskeletal: Negative for joint pain and myalgias.  Skin: Negative for rash.  Neurological: Negative for tingling and headaches.  Psychiatric/Behavioral: Negative for depression. The patient is not nervous/anxious.     Objective:  BP 111/76 (BP Location: Left Arm, Patient Position: Sitting, Cuff Size: Normal)   Pulse 84   Temp 97.8 F (36.6 C) (Oral)   Wt 150 lb 6.4 oz (68.2 kg)   LMP 02/23/2017 (Exact Date)   SpO2 99%   BMI 28.77 kg/m    BP/Weight 03/15/2017 03/01/2017 3/61/4431  Systolic BP 540 086 761  Diastolic BP 76 78 62  Wt. (Lbs) 150.4 147.6 -  BMI 28.77 28.23 -      Physical Exam  Constitutional: She is oriented to person, place, and time.  Well developed, well nourished, NAD, polite  HENT:  Head: Normocephalic and atraumatic.  Eyes: No scleral icterus.  Neck: Normal range of motion. Neck supple. No thyromegaly present.  Cardiovascular: Normal rate, regular rhythm and normal heart sounds.   Pulmonary/Chest: Effort normal and breath sounds normal.  Musculoskeletal: She exhibits no edema.  Generalized tenderness to palpation in all extremities.  Neurological: She is alert and oriented to person, place, and time. No cranial nerve deficit. Coordination normal.  Skin: Skin is warm and dry. No rash noted. No erythema. No pallor.  Psychiatric: Her behavior is normal. Thought content normal.  Depressed mood  Vitals reviewed.    Assessment & Plan:    1. Other chronic pain - B. Burgdorfi Antibodies  2. Chronic right-sided low back pain without sciatica - Urinalysis Dipstick with trace leukocytes.  3. Bipolar I disorder (HCC) - Increase risperiDONE (RISPERDAL) 1 MG tablet; Take 1 tablet (1 mg total) by mouth at bedtime.  Dispense: 30 tablet; Refill: 5  4. Fatigue, unspecified type - Begin vitamin B-12 (CYANOCOBALAMIN) 100 MCG tablet; Take 1 tablet (100 mcg total) by mouth daily.  Dispense: 30 tablet; Refill: 0 - Begin Cholecalciferol (VITAMIN D3) 5000 units CAPS;  Take 1 capsule (5,000 Units total) by mouth daily.  Dispense: 30 capsule; Refill: 0  5. Insomnia, unspecified type - Begin hydrOXYzine (ATARAX/VISTARIL) 25 MG tablet; Take 1 tablet (25 mg total) by mouth 3 (three) times daily as needed.  Dispense: 30 tablet; Refill: 0  6. Wheat allergy - Begin hydrOXYzine (ATARAX/VISTARIL) 25 MG tablet; Take 1 tablet (25 mg total) by mouth 3 (three) times daily as needed.  Dispense: 30 tablet; Refill:  0   Meds ordered this encounter  Medications  . vitamin B-12 (CYANOCOBALAMIN) 100 MCG tablet    Sig: Take 1 tablet (100 mcg total) by mouth daily.    Dispense:  30 tablet    Refill:  0    Order Specific Question:   Supervising Provider    Answer:   Tresa Garter [id number]  . Cholecalciferol (VITAMIN D3) 5000 units CAPS    Sig: Take 1 capsule (5,000 Units total) by mouth daily.    Dispense:  30 capsule    Refill:  0    Order Specific Question:   Supervising Provider    Answer:   Tresa Garter [id number]  . risperiDONE (RISPERDAL) 1 MG tablet    Sig: Take 1 tablet (1 mg total) by mouth at bedtime.    Dispense:  30 tablet    Refill:  5    Order Specific Question:   Supervising Provider    Answer:   Tresa Garter [id number]  . hydrOXYzine (ATARAX/VISTARIL) 25 MG tablet    Sig: Take 1 tablet (25 mg total) by mouth 3 (three) times daily as needed.    Dispense:  30 tablet    Refill:  0    Order Specific Question:   Supervising Provider    Answer:   Tresa Garter [[id number]]    Follow-up: 4 weeks  Clent Demark PA

## 2017-03-19 ENCOUNTER — Telehealth (INDEPENDENT_AMBULATORY_CARE_PROVIDER_SITE_OTHER): Payer: Self-pay

## 2017-03-19 LAB — B. BURGDORFI ANTIBODIES: Lyme IgG/IgM Ab: <0.91 {ISR} (ref 0.00–0.90)

## 2017-03-19 NOTE — Telephone Encounter (Signed)
Patient Is aware that lyme is negative and that lab testing thus far has not been able to pinpoint reason for pain. Patient aware to keep all follow  Up appointments. Nat Christen, CMA

## 2017-03-19 NOTE — Telephone Encounter (Signed)
-----   Message from Clent Demark, PA-C sent at 03/19/2017  8:52 AM EDT ----- Negative for Lyme. Work up has thus far not been able to pinpoint source of her generalized pain. It may be possible she has fibromyalgia. Keep f/u appt please.

## 2017-04-02 ENCOUNTER — Ambulatory Visit (INDEPENDENT_AMBULATORY_CARE_PROVIDER_SITE_OTHER): Payer: Medicare Other | Admitting: Physician Assistant

## 2017-04-02 ENCOUNTER — Encounter (INDEPENDENT_AMBULATORY_CARE_PROVIDER_SITE_OTHER): Payer: Self-pay | Admitting: Physician Assistant

## 2017-04-02 VITALS — BP 104/68 | HR 68 | Temp 98.6°F | Resp 18 | Ht 62.0 in | Wt 153.0 lb

## 2017-04-02 DIAGNOSIS — N6452 Nipple discharge: Secondary | ICD-10-CM

## 2017-04-02 DIAGNOSIS — R202 Paresthesia of skin: Secondary | ICD-10-CM | POA: Diagnosis not present

## 2017-04-02 DIAGNOSIS — G8929 Other chronic pain: Secondary | ICD-10-CM

## 2017-04-02 DIAGNOSIS — R52 Pain, unspecified: Secondary | ICD-10-CM | POA: Diagnosis not present

## 2017-04-02 DIAGNOSIS — M25552 Pain in left hip: Secondary | ICD-10-CM | POA: Diagnosis not present

## 2017-04-02 LAB — POCT URINALYSIS DIPSTICK
BILIRUBIN UA: NEGATIVE
Blood, UA: NEGATIVE
GLUCOSE UA: NEGATIVE
KETONES UA: NEGATIVE
Nitrite, UA: NEGATIVE
PROTEIN UA: NEGATIVE
Spec Grav, UA: 1.025 (ref 1.010–1.025)
Urobilinogen, UA: 0.2 E.U./dL
pH, UA: 5.5 (ref 5.0–8.0)

## 2017-04-02 LAB — POCT URINE PREGNANCY: Preg Test, Ur: NEGATIVE

## 2017-04-02 MED ORDER — GABAPENTIN 300 MG PO CAPS: 300.0000 mg | ORAL_CAPSULE | Freq: Three times a day (TID) | ORAL | 3 refills | Status: DC

## 2017-04-02 NOTE — Patient Instructions (Signed)
Myofascial Pain Syndrome and Fibromyalgia Myofascial pain syndrome and fibromyalgia are both pain disorders. This pain may be felt mainly in your muscles.  Myofascial pain syndrome: ? Always has trigger points or tender points in the muscle that will cause pain when pressed. The pain may come and go. ? Usually affects your neck, upper back, and shoulder areas. The pain often radiates into your arms and hands.  Fibromyalgia: ? Has muscle pains and tenderness that come and go. ? Is often associated with fatigue and sleep disturbances. ? Has trigger points. ? Tends to be long-lasting (chronic), but is not life-threatening.  Fibromyalgia and myofascial pain are not the same. However, they often occur together. If you have both conditions, each can make the other worse. Both are common and can cause enough pain and fatigue to make day-to-day activities difficult. What are the causes? The exact causes of fibromyalgia and myofascial pain are not known. People with certain gene types may be more likely to develop fibromyalgia. Some factors can be triggers for both conditions, such as:  Spine disorders.  Arthritis.  Severe injury (trauma) and other physical stressors.  Being under a lot of stress.  A medical illness.  What are the signs or symptoms? Fibromyalgia The main symptom of fibromyalgia is widespread pain and tenderness in your muscles. This can vary over time. Pain is sometimes described as stabbing, shooting, or burning. You may have tingling or numbness, too. You may also have sleep problems and fatigue. You may wake up feeling tired and groggy (fibro fog). Other symptoms may include:  Bowel and bladder problems.  Headaches.  Visual problems.  Problems with odors and noises.  Depression or mood changes.  Painful menstrual periods (dysmenorrhea).  Dry skin or eyes.  Myofascial pain syndrome Symptoms of myofascial pain syndrome include:  Tight, ropy bands of  muscle.  Uncomfortable sensations in muscular areas, such as: ? Aching. ? Cramping. ? Burning. ? Numbness. ? Tingling. ? Muscle weakness.  Trouble moving certain muscles freely (range of motion).  How is this diagnosed? There are no specific tests to diagnose fibromyalgia or myofascial pain syndrome. Both can be hard to diagnose because their symptoms are common in many other conditions. Your health care provider may suspect one or both of these conditions based on your symptoms and medical history. Your health care provider will also do a physical exam. The key to diagnosing fibromyalgia is having pain, fatigue, and other symptoms for more than three months that cannot be explained by another condition. The key to diagnosing myofascial pain syndrome is finding trigger points in muscles that are tender and cause pain elsewhere in your body (referred pain). How is this treated? Treating fibromyalgia and myofascial pain often requires a team of health care providers. This usually starts with your primary provider and a physical therapist. You may also find it helpful to work with alternative health care providers, such as massage therapists or acupuncturists. Treatment for fibromyalgia may include medicines. This may include nonsteroidal anti-inflammatory drugs (NSAIDs), along with other medicines. Treatment for myofascial pain may also include:  NSAIDs.  Cooling and stretching of muscles.  Trigger point injections.  Sound wave (ultrasound) treatments to stimulate muscles.  Follow these instructions at home:  Take medicines only as directed by your health care provider.  Exercise as directed by your health care provider or physical therapist.  Try to avoid stressful situations.  Practice relaxation techniques to control your stress. You may want to try: ? Biofeedback. ? Visual   imagery. ? Hypnosis. ? Muscle relaxation. ? Yoga. ? Meditation.  Talk to your health care provider  about alternative treatments, such as acupuncture or massage treatment.  Maintain a healthy lifestyle. This includes eating a healthy diet and getting enough sleep.  Consider joining a support group.  Do not do activities that stress or strain your muscles. That includes repetitive motions and heavy lifting. Where to find more information:  National Fibromyalgia Association: www.fmaware.org  Arthritis Foundation: www.arthritis.org  American Chronic Pain Association: www.theacpa.org/condition/myofascial-pain Contact a health care provider if:  You have new symptoms.  Your symptoms get worse.  You have side effects from your medicines.  You have trouble sleeping.  Your condition is causing depression or anxiety. This information is not intended to replace advice given to you by your health care provider. Make sure you discuss any questions you have with your health care provider. Document Released: 07/09/2005 Document Revised: 12/15/2015 Document Reviewed: 04/14/2014 Elsevier Interactive Patient Education  2018 Elsevier Inc.  

## 2017-04-02 NOTE — Progress Notes (Signed)
Subjective:  Patient ID: Tiffany Juarez, female    DOB: 01/15/1981  Age: 36 y.o. MRN: [id number]  CC: left hip pain  HPI  Tiffany Juarez a 36 y.o.femalewith a PMH of bipolar disorder, depression, anginal pain, chronic generalized pain, and fibroid presents for left sided hip pain. Still continues with generalized pain in torso and limbs, however the left hip has been especially painful. Pain radiates from the left hip to the lower left back. Associated with "burning" in the hip and back. No urinary or bowel symptoms. Denies weakness/paralysis of the bilateral LE, rash, f/c/n/v.     Pt began taking increased dose of risperidone. Now at 1 mg qhs and feels her bipolar disorder is much better controlled. However, she has noticed nipple discharge in the morning upon awakening. No discharge during the day.    Outpatient Medications Prior to Visit  Medication Sig Dispense Refill  . Cholecalciferol (VITAMIN D3) 5000 units CAPS Take 1 capsule (5,000 Units total) by mouth daily. 30 capsule 0  . hydrOXYzine (ATARAX/VISTARIL) 25 MG tablet Take 1 tablet (25 mg total) by mouth 3 (three) times daily as needed. 30 tablet 0  . meloxicam (MOBIC) 7.5 MG tablet Take 1 tablet (7.5 mg total) by mouth daily. 30 tablet 0  . risperiDONE (RISPERDAL) 1 MG tablet Take 1 tablet (1 mg total) by mouth at bedtime. 30 tablet 5  . vitamin B-12 (CYANOCOBALAMIN) 100 MCG tablet Take 1 tablet (100 mcg total) by mouth daily. 30 tablet 0  . predniSONE (DELTASONE) 50 MG tablet 1 tab po daily for 6 days. Take with food. (Patient not taking: Reported on 03/01/2017) 6 tablet 0   No facility-administered medications prior to visit.      ROS Review of Systems  Constitutional: Negative for chills, fever and malaise/fatigue.  Eyes: Negative for blurred vision.  Respiratory: Negative for shortness of breath.   Cardiovascular: Negative for chest pain and palpitations.  Gastrointestinal: Negative for abdominal pain and nausea.   Genitourinary: Negative for dysuria and hematuria.  Musculoskeletal: Positive for back pain, joint pain and myalgias.  Skin: Negative for rash.  Neurological: Negative for tingling and headaches.       Burning pain  Psychiatric/Behavioral: Negative for depression. The patient is not nervous/anxious.     Objective:  Ht '5\' 2"'  (1.575 m)   Wt 153 lb (69.4 kg)   LMP 03/26/2017   BMI 27.98 kg/m   BP/Weight 04/02/2017 03/15/2017 10/19/5186  Systolic BP - 416 606  Diastolic BP - 76 78  Wt. (Lbs) 153 150.4 147.6  BMI 27.98 28.77 28.23      Physical Exam  Constitutional: She is oriented to person, place, and time.  Well developed, well nourished, NAD, polite  HENT:  Head: Normocephalic and atraumatic.  Eyes: No scleral icterus.  Cardiovascular: Normal rate, regular rhythm and normal heart sounds.   Pulmonary/Chest: Effort normal and breath sounds normal.  Musculoskeletal: She exhibits no edema.  Generalized TTP along the left lower back and left hip.   Neurological: She is alert and oriented to person, place, and time. No cranial nerve deficit. Coordination normal.  Normal gait  Skin: Skin is warm and dry. No rash noted. No erythema. No pallor.  Psychiatric: She has a normal mood and affect. Her behavior is normal. Thought content normal.  Vitals reviewed.    Assessment & Plan:    1. Chronic generalized pain - Negative ANA, TSH, CBC, CMP, Lyme dz, Allergen zone 2, ESR, CRP.   - Begin gabapentin (  NEURONTIN) 300 MG capsule; Take 1 capsule (300 mg total) by mouth 3 (three) times daily.  Dispense: 90 capsule; Refill: 3  2. Left hip pain - Urinalysis Dipstick with trace leukocytes, no urinary symptoms. - POCT urine pregnancy negative in clinic today. - Begin gabapentin (NEURONTIN) 300 MG capsule; Take 1 capsule (300 mg total) by mouth 3 (three) times daily.  Dispense: 90 capsule; Refill: 3  3. Nipple discharge - Likely from Risperidone use. Discussed benefit vs risk of  risperidone use. Patient opts to continue as risperidone is very effective in controlling her bipolar disorder.   4. Paresthesia - Begin gabapentin (NEURONTIN) 300 MG capsule; Take 1 capsule (300 mg total) by mouth 3 (three) times daily.  Dispense: 90 capsule; Refill: 3   Meds ordered this encounter  Medications  . gabapentin (NEURONTIN) 300 MG capsule    Sig: Take 1 capsule (300 mg total) by mouth 3 (three) times daily.    Dispense:  90 capsule    Refill:  3    Order Specific Question:   Supervising Provider    Answer:   Tresa Garter [id number]    Follow-up: Return in about 3 weeks (around 04/23/2017) for PAP, vaccines.   Clent Demark PA

## 2017-04-16 DIAGNOSIS — F3131 Bipolar disorder, current episode depressed, mild: Secondary | ICD-10-CM | POA: Diagnosis not present

## 2017-04-22 ENCOUNTER — Ambulatory Visit (INDEPENDENT_AMBULATORY_CARE_PROVIDER_SITE_OTHER): Payer: Medicare Other | Admitting: Physician Assistant

## 2017-04-22 ENCOUNTER — Encounter (INDEPENDENT_AMBULATORY_CARE_PROVIDER_SITE_OTHER): Payer: Self-pay | Admitting: Physician Assistant

## 2017-04-22 VITALS — BP 112/74 | HR 66 | Temp 97.7°F | Wt 162.0 lb

## 2017-04-22 DIAGNOSIS — G894 Chronic pain syndrome: Secondary | ICD-10-CM

## 2017-04-22 DIAGNOSIS — Z23 Encounter for immunization: Secondary | ICD-10-CM | POA: Diagnosis not present

## 2017-04-22 DIAGNOSIS — R202 Paresthesia of skin: Secondary | ICD-10-CM | POA: Diagnosis not present

## 2017-04-22 DIAGNOSIS — F411 Generalized anxiety disorder: Secondary | ICD-10-CM | POA: Diagnosis not present

## 2017-04-22 MED ORDER — GABAPENTIN 300 MG PO CAPS: 600.0000 mg | ORAL_CAPSULE | Freq: Three times a day (TID) | ORAL | 2 refills | Status: DC

## 2017-04-22 NOTE — Patient Instructions (Addendum)
Generalized Anxiety Disorder, Adult Generalized anxiety disorder (GAD) is a mental health disorder. People with this condition constantly worry about everyday events. Unlike normal anxiety, worry related to GAD is not triggered by a specific event. These worries also do not fade or get better with time. GAD interferes with life functions, including relationships, work, and school. GAD can vary from mild to severe. People with severe GAD can have intense waves of anxiety with physical symptoms (panic attacks). What are the causes? The exact cause of GAD is not known. What increases the risk? This condition is more likely to develop in:  Women.  People who have a family history of anxiety disorders.  People who are very shy.  People who experience very stressful life events, such as the death of a loved one.  People who have a very stressful family environment.  What are the signs or symptoms? People with GAD often worry excessively about many things in their lives, such as their health and family. They may also be overly concerned about:  Doing well at work.  Being on time.  Natural disasters.  Friendships.  Physical symptoms of GAD include:  Fatigue.  Muscle tension or having muscle twitches.  Trembling or feeling shaky.  Being easily startled.  Feeling like your heart is pounding or racing.  Feeling out of breath or like you cannot take a deep breath.  Having trouble falling asleep or staying asleep.  Sweating.  Nausea, diarrhea, or irritable bowel syndrome (IBS).  Headaches.  Trouble concentrating or remembering facts.  Restlessness.  Irritability.  How is this diagnosed? Your health care provider can diagnose GAD based on your symptoms and medical history. You will also have a physical exam. The health care provider will ask specific questions about your symptoms, including how severe they are, when they started, and if they come and go. Your health care  provider may ask you about your use of alcohol or drugs, including prescription medicines. Your health care provider may refer you to a mental health specialist for further evaluation. Your health care provider will do a thorough examination and may perform additional tests to rule out other possible causes of your symptoms. To be diagnosed with GAD, a person must have anxiety that:  Is out of his or her control.  Affects several different aspects of his or her life, such as work and relationships.  Causes distress that makes him or her unable to take part in normal activities.  Includes at least three physical symptoms of GAD, such as restlessness, fatigue, trouble concentrating, irritability, muscle tension, or sleep problems.  Before your health care provider can confirm a diagnosis of GAD, these symptoms must be present more days than they are not, and they must last for six months or longer. How is this treated? The following therapies are usually used to treat GAD:  Medicine. Antidepressant medicine is usually prescribed for long-term daily control. Antianxiety medicines may be added in severe cases, especially when panic attacks occur.  Talk therapy (psychotherapy). Certain types of talk therapy can be helpful in treating GAD by providing support, education, and guidance. Options include: ? Cognitive behavioral therapy (CBT). People learn coping skills and techniques to ease their anxiety. They learn to identify unrealistic or negative thoughts and behaviors and to replace them with positive ones. ? Acceptance and commitment therapy (ACT). This treatment teaches people how to be mindful as a way to cope with unwanted thoughts and feelings. ? Biofeedback. This process trains you to   manage your body's response (physiological response) through breathing techniques and relaxation methods. You will work with a therapist while machines are used to monitor your physical symptoms.  Stress  management techniques. These include yoga, meditation, and exercise.  A mental health specialist can help determine which treatment is best for you. Some people see improvement with one type of therapy. However, other people require a combination of therapies. Follow these instructions at home:  Take over-the-counter and prescription medicines only as told by your health care provider.  Try to maintain a normal routine.  Try to anticipate stressful situations and allow extra time to manage them.  Practice any stress management or self-calming techniques as taught by your health care provider.  Do not punish yourself for setbacks or for not making progress.  Try to recognize your accomplishments, even if they are small.  Keep all follow-up visits as told by your health care provider. This is important. Contact a health care provider if:  Your symptoms do not get better.  Your symptoms get worse.  You have signs of depression, such as: ? A persistently sad, cranky, or irritable mood. ? Loss of enjoyment in activities that used to bring you joy. ? Change in weight or eating. ? Changes in sleeping habits. ? Avoiding friends or family members. ? Loss of energy for normal tasks. ? Feelings of guilt or worthlessness. Get help right away if:  You have serious thoughts about hurting yourself or others. If you ever feel like you may hurt yourself or others, or have thoughts about taking your own life, get help right away. You can go to your nearest emergency department or call:  Your local emergency services (911 in the [place]  A suicide crisis helpline, such as the [facility] at 678 116 1073. This is open 24 hours a day.  Summary  Generalized anxiety disorder (GAD) is a mental health disorder that involves worry that is not triggered by a specific event.  People with GAD often worry excessively about many things in their lives, such as their health and  family.  GAD may cause physical symptoms such as restlessness, trouble concentrating, sleep problems, frequent sweating, nausea, diarrhea, headaches, and trembling or muscle twitching.  A mental health specialist can help determine which treatment is best for you. Some people see improvement with one type of therapy. However, other people require a combination of therapies. This information is not intended to replace advice given to you by your health care provider. Make sure you discuss any questions you have with your health care provider. Document Released: 11/03/2012 Document Revised: 05/29/2016 Document Reviewed: 05/29/2016 Elsevier Interactive Patient Education  2018 [place] Syndrome Carpal tunnel syndrome is a condition that causes pain in your hand and arm. The carpal tunnel is a narrow area located on the palm side of your wrist. Repeated wrist motion or certain diseases may cause swelling within the tunnel. This swelling pinches the main nerve in the wrist (median nerve). What are the causes? This condition may be caused by:  Repeated wrist motions.  Wrist injuries.  Arthritis.  A cyst or tumor in the carpal tunnel.  Fluid buildup during pregnancy.  Sometimes the cause of this condition is not known. What increases the risk? This condition is more likely to develop in:  People who have jobs that cause them to repeatedly move their wrists in the same motion, such as Art gallery manager.  Women.  People with certain conditions, such as: ?  Diabetes. ? Obesity. ? An underactive thyroid (hypothyroidism). ? Kidney failure.  What are the signs or symptoms? Symptoms of this condition include:  A tingling feeling in your fingers, especially in your thumb, index, and middle fingers.  Tingling or numbness in your hand.  An aching feeling in your entire arm, especially when your wrist and elbow are bent for long periods of time.  Wrist pain that  goes up your arm to your shoulder.  Pain that goes down into your palm or fingers.  A weak feeling in your hands. You may have trouble grabbing and holding items.  Your symptoms may feel worse during the night. How is this diagnosed? This condition is diagnosed with a medical history and physical exam. You may also have tests, including:  An electromyogram (EMG). This test measures electrical signals sent by your nerves into the muscles.  X-rays.  How is this treated? Treatment for this condition includes:  Lifestyle changes. It is important to stop doing or modify the activity that caused your condition.  Physical or occupational therapy.  Medicines for pain and inflammation. This may include medicine that is injected into your wrist.  A wrist splint.  Surgery.  Follow these instructions at home: If you have a splint:  Wear it as told by your health care provider. Remove it only as told by your health care provider.  Loosen the splint if your fingers become numb and tingle, or if they turn cold and blue.  Keep the splint clean and dry. General instructions  Take over-the-counter and prescription medicines only as told by your health care provider.  Rest your wrist from any activity that may be causing your pain. If your condition is work related, talk to your employer about changes that can be made, such as getting a wrist pad to use while typing.  If directed, apply ice to the painful area: ? Put ice in a plastic bag. ? Place a towel between your skin and the bag. ? Leave the ice on for 20 minutes, 2-3 times per day.  Keep all follow-up visits as told by your health care provider. This is important.  Do any exercises as told by your health care provider, physical therapist, or occupational therapist. Contact a health care provider if:  You have new symptoms.  Your pain is not controlled with medicines.  Your symptoms get worse. This information is not  intended to replace advice given to you by your health care provider. Make sure you discuss any questions you have with your health care provider. Document Released: 07/06/2000 Document Revised: 11/17/2015 Document Reviewed: 11/24/2014 Elsevier Interactive Patient Education  2017 [employer].

## 2017-04-22 NOTE — Progress Notes (Signed)
Subjective:  Patient ID: Tiffany Juarez, female    DOB: Jan 07, 1996  Age: 36 y.o. MRN: [id number]  CC: pain and immunizations  HPI Tiffany Juarez is a 36 y.o. female with a medical history of angina presents to f/u on chronic generalized pain. Negative ANA, TSH, CBC, CMP, Lyme dz, Allergen zone 2, ESR, CRP. She was prescribed gabapentin 300 mg TID and reports a 75% percent reduction in generalized pain.     Pt seen by a psychiatrist three days ago. Prescribed Buspar and told to continue on Risperidone 1 mg.        Outpatient Medications Prior to Visit  Medication Sig Dispense Refill  . Cholecalciferol (VITAMIN D3) 5000 units CAPS Take 1 capsule (5,000 Units total) by mouth daily. 30 capsule 0  . gabapentin (NEURONTIN) 300 MG capsule Take 1 capsule (300 mg total) by mouth 3 (three) times daily. 90 capsule 3  . hydrOXYzine (ATARAX/VISTARIL) 25 MG tablet Take 1 tablet (25 mg total) by mouth 3 (three) times daily as needed. 30 tablet 0  . risperiDONE (RISPERDAL) 1 MG tablet Take 1 tablet (1 mg total) by mouth at bedtime. 30 tablet 5  . meloxicam (MOBIC) 7.5 MG tablet Take 1 tablet (7.5 mg total) by mouth daily. (Patient not taking: Reported on 04/22/2017) 30 tablet 0   No facility-administered medications prior to visit.      ROS Review of Systems  Constitutional: Negative for chills, fever and malaise/fatigue.  Eyes: Negative for blurred vision.  Respiratory: Negative for shortness of breath.   Cardiovascular: Negative for chest pain and palpitations.  Gastrointestinal: Negative for abdominal pain and nausea.  Genitourinary: Negative for dysuria and hematuria.  Musculoskeletal: Negative for joint pain and myalgias.  Skin: Negative for rash.  Neurological: Negative for tingling and headaches.  Psychiatric/Behavioral: Negative for depression. The patient is not nervous/anxious.     Objective:  BP 112/74 (BP Location: Left Arm, Patient Position: Sitting, Cuff Size: Normal)   Pulse 66    Temp 97.7 F (36.5 C) (Oral)   Wt 162 lb (73.5 kg)   LMP 03/26/2017   SpO2 99%   BMI 29.63 kg/m   BP/Weight 04/22/2017 04/02/2017 9/62/9528  Systolic BP 413 244 010  Diastolic BP 74 68 76  Wt. (Lbs) 162 153 150.4  BMI 29.63 27.98 28.77      Physical Exam  Constitutional: She is oriented to person, place, and time.  Well developed, well nourished, NAD, polite  HENT:  Head: Normocephalic and atraumatic.  Eyes: Conjunctivae are normal. No scleral icterus.  Neck: Normal range of motion. Neck supple. No thyromegaly present.  Cardiovascular: Normal rate, regular rhythm and normal heart sounds.   Pulmonary/Chest: Effort normal.  Musculoskeletal: She exhibits no edema.  Neurological: She is alert and oriented to person, place, and time. No cranial nerve deficit. Coordination normal.  Skin: Skin is warm and dry. No rash noted. No erythema. No pallor.  Psychiatric: Her behavior is normal. Thought content normal.  Constricted affect, depressed mood.  Vitals reviewed.    Assessment & Plan:   1. Chronic pain syndrome - Increase gabapentin (NEURONTIN) 300 MG capsule; Take 2 capsules (600 mg total) by mouth 3 (three) times daily.  Dispense: 180 capsule; Refill: 2  2. Anxiety state - Continue management with psychiatry  3. Need for Tdap vaccination - Tdap vaccine greater than or equal to 7yo IM  4. Paresthesia - Ambulatory referral to Neurology   Meds ordered this encounter  Medications  . gabapentin (NEURONTIN) 300 MG  capsule    Sig: Take 2 capsules (600 mg total) by mouth 3 (three) times daily.    Dispense:  180 capsule    Refill:  2    Order Specific Question:   Supervising Provider    Answer:   Tresa Garter [id number]    Follow-up: Return in about 8 weeks (around 06/17/2017) for chronic pain syndrome.   Clent Demark PA

## 2017-05-06 ENCOUNTER — Ambulatory Visit (INDEPENDENT_AMBULATORY_CARE_PROVIDER_SITE_OTHER): Payer: Medicare Other | Admitting: Physician Assistant

## 2017-05-06 ENCOUNTER — Encounter (INDEPENDENT_AMBULATORY_CARE_PROVIDER_SITE_OTHER): Payer: Self-pay | Admitting: Physician Assistant

## 2017-05-06 ENCOUNTER — Other Ambulatory Visit (HOSPITAL_COMMUNITY)
Admission: RE | Admit: 2017-05-06 | Discharge: 2017-05-06 | Disposition: A | Discharge status: Home / Self Care | Payer: Medicare Other | Source: Ambulatory Visit | Attending: Physician Assistant | Admitting: Physician Assistant

## 2017-05-06 VITALS — BP 100/68 | HR 74 | Temp 97.8°F | Wt 165.6 lb

## 2017-05-06 DIAGNOSIS — Z124 Encounter for screening for malignant neoplasm of cervix: Secondary | ICD-10-CM

## 2017-05-06 DIAGNOSIS — A5901 Trichomonal vulvovaginitis: Secondary | ICD-10-CM | POA: Diagnosis not present

## 2017-05-06 DIAGNOSIS — B379 Candidiasis, unspecified: Secondary | ICD-10-CM

## 2017-05-06 DIAGNOSIS — B373 Candidiasis of vulva and vagina: Secondary | ICD-10-CM | POA: Diagnosis not present

## 2017-05-06 DIAGNOSIS — N760 Acute vaginitis: Secondary | ICD-10-CM | POA: Insufficient documentation

## 2017-05-06 DIAGNOSIS — B9689 Other specified bacterial agents as the cause of diseases classified elsewhere: Secondary | ICD-10-CM | POA: Insufficient documentation

## 2017-05-06 DIAGNOSIS — Z01419 Encounter for gynecological examination (general) (routine) without abnormal findings: Secondary | ICD-10-CM | POA: Diagnosis not present

## 2017-05-06 MED ORDER — FLUCONAZOLE 150 MG PO TABS: 150.0000 mg | ORAL_TABLET | ORAL | 0 refills | Status: DC

## 2017-05-06 NOTE — Patient Instructions (Signed)

## 2017-05-06 NOTE — Progress Notes (Signed)
   Subjective:  Patient ID: Tiffany Juarez, female    DOB: March 02, 1981  Age: 36 y.o. MRN: [id number]  CC: PAP  HPI Bobby Spegal is a 36 y.o. female presents for screening for cervical cancer. No genitourinary symptoms to report.        Outpatient Medications Prior to Visit  Medication Sig Dispense Refill  . busPIRone (BUSPAR) 10 MG tablet Take 10 mg by mouth 2 (two) times daily with a meal.  0  . Cholecalciferol (VITAMIN D3) 5000 units CAPS Take 1 capsule (5,000 Units total) by mouth daily. 30 capsule 0  . gabapentin (NEURONTIN) 300 MG capsule Take 2 capsules (600 mg total) by mouth 3 (three) times daily. 180 capsule 2  . hydrOXYzine (ATARAX/VISTARIL) 25 MG tablet Take 1 tablet (25 mg total) by mouth 3 (three) times daily as needed. 30 tablet 0  . risperiDONE (RISPERDAL) 1 MG tablet Take 1 tablet (1 mg total) by mouth at bedtime. 30 tablet 5   No facility-administered medications prior to visit.      ROS Review of Systems  Constitutional: Negative for chills, fever and malaise/fatigue.  Eyes: Negative for blurred vision.  Respiratory: Negative for shortness of breath.   Cardiovascular: Negative for chest pain and palpitations.  Gastrointestinal: Negative for abdominal pain and nausea.  Genitourinary: Negative for dysuria and hematuria.  Musculoskeletal: Negative for joint pain and myalgias.  Skin: Negative for rash.  Neurological: Negative for tingling and headaches.  Psychiatric/Behavioral: Negative for depression. The patient is not nervous/anxious.     Objective:  BP 100/68 (BP Location: Left Arm, Patient Position: Sitting, Cuff Size: Large)   Pulse 74   Temp 97.8 F (36.6 C) (Oral)   Wt 165 lb 9.6 oz (75.1 kg)   LMP 04/22/2017 (Approximate)   SpO2 98%   BMI 30.29 kg/m   BP/Weight 05/06/2017 04/22/2017 2/58/5277  Systolic BP 824 235 361  Diastolic BP 68 74 68  Wt. (Lbs) 165.6 162 153  BMI 30.29 29.63 27.98      Physical Exam  Constitutional: She is oriented to  person, place, and time.  Well developed, well nourished, NAD, polite  HENT:  Head: Normocephalic and atraumatic.  Pulmonary/Chest: Effort normal.  Genitourinary:  Genitourinary Comments: Thick white curdy vaginal drainage. Cervix normal, no motion tenderness. No adnexal tenderness or mass bilaterally, no uterine mass or tenderness.  Musculoskeletal: She exhibits no edema.  Neurological: She is alert and oriented to person, place, and time.  Skin: Skin is warm and dry. No rash noted. No erythema. No pallor.  Psychiatric: She has a normal mood and affect. Her behavior is normal. Thought content normal.  Vitals reviewed.    Assessment & Plan:    1. Screening for cervical cancer - Cytology - PAP  2. Yeast infection - Begin fluconazole (DIFLUCAN) 150 MG tablet; Take 1 tablet (150 mg total) by mouth every 3 (three) days.  Dispense: 2 tablet; Refill: 0   Meds ordered this encounter  Medications  . fluconazole (DIFLUCAN) 150 MG tablet    Sig: Take 1 tablet (150 mg total) by mouth every 3 (three) days.    Dispense:  2 tablet    Refill:  0    Order Specific Question:   Supervising Provider    Answer:   Tresa Garter [[id number]]    Follow-up: PRN   Clent Demark PA

## 2017-05-10 ENCOUNTER — Other Ambulatory Visit (INDEPENDENT_AMBULATORY_CARE_PROVIDER_SITE_OTHER): Payer: Self-pay | Admitting: Physician Assistant

## 2017-05-10 ENCOUNTER — Telehealth: Payer: Self-pay

## 2017-05-10 DIAGNOSIS — N760 Acute vaginitis: Secondary | ICD-10-CM

## 2017-05-10 DIAGNOSIS — A599 Trichomoniasis, unspecified: Secondary | ICD-10-CM

## 2017-05-10 DIAGNOSIS — B379 Candidiasis, unspecified: Secondary | ICD-10-CM

## 2017-05-10 DIAGNOSIS — B9689 Other specified bacterial agents as the cause of diseases classified elsewhere: Secondary | ICD-10-CM

## 2017-05-10 LAB — CYTOLOGY - PAP
Adequacy: ABSENT
BACTERIAL VAGINITIS: POSITIVE — AB
CANDIDA VAGINITIS: POSITIVE — AB
CHLAMYDIA, DNA PROBE: NEGATIVE
Diagnosis: NEGATIVE
Neisseria Gonorrhea: NEGATIVE
Trichomonas: POSITIVE — AB

## 2017-05-10 MED ORDER — METRONIDAZOLE 500 MG PO TABS: 500.0000 mg | ORAL_TABLET | Freq: Two times a day (BID) | ORAL | 0 refills | Status: AC

## 2017-05-10 MED ORDER — FLUCONAZOLE 150 MG PO TABS: 150.0000 mg | ORAL_TABLET | ORAL | 0 refills | Status: DC

## 2017-05-10 NOTE — Telephone Encounter (Signed)
Patient aware of all labs results explained to patient that all results are infections except the trichomonas(STD) patient expressed understanding and will pick up Rx and take as directed. Nat Christen, CMA

## 2017-05-10 NOTE — Telephone Encounter (Signed)
-----   Message from Clent Demark, PA-C sent at 05/10/2017 10:10 AM EDT ----- PAP negative for malignancy. Positive for BV, Trichomonas, and yeast. I will send abx out and refill fluconazole.

## 2017-05-28 DIAGNOSIS — F3131 Bipolar disorder, current episode depressed, mild: Secondary | ICD-10-CM | POA: Diagnosis not present

## 2017-06-17 ENCOUNTER — Encounter (INDEPENDENT_AMBULATORY_CARE_PROVIDER_SITE_OTHER): Payer: Self-pay | Admitting: Physician Assistant

## 2017-06-17 ENCOUNTER — Other Ambulatory Visit: Payer: Self-pay

## 2017-06-17 ENCOUNTER — Ambulatory Visit (INDEPENDENT_AMBULATORY_CARE_PROVIDER_SITE_OTHER): Payer: Medicare Other | Admitting: Physician Assistant

## 2017-06-17 VITALS — BP 129/87 | HR 82 | Temp 97.7°F | Wt 175.4 lb

## 2017-06-17 DIAGNOSIS — G894 Chronic pain syndrome: Secondary | ICD-10-CM | POA: Diagnosis not present

## 2017-06-17 LAB — POCT GLYCOSYLATED HEMOGLOBIN (HGB A1C): HEMOGLOBIN A1C: 5.9

## 2017-06-17 MED ORDER — GABAPENTIN 300 MG PO CAPS: 900.0000 mg | ORAL_CAPSULE | Freq: Three times a day (TID) | ORAL | 2 refills | Status: DC

## 2017-06-17 MED ORDER — ACETAMINOPHEN-CODEINE #3 300-30 MG PO TABS: 1.0000 | ORAL_TABLET | Freq: Every day | ORAL | 0 refills | Status: AC

## 2017-06-17 NOTE — Progress Notes (Signed)
Subjective:  Patient ID: Tiffany Juarez, female    DOB: 05-30-1981  Age: 36 y.o. MRN: [id number]  CC: f/u chronic pain  HPI  Meara Wiechman is a 36 y.o. female with a medical history of angina presents to f/u on chronic generalized pain. Negative ANA, TSH, CBC, CMP, Lyme dz, Allergen zone 2, ESR, CRP. Gabapentin was increased to 600 mg TID from 300 mg TID due to good (75% reduction in pain) but insufficient response to 300 mg TID. However, she says the new dose was only effective temporarily and has now stopped working. Worried she has diabetes as the cause of her chronic pains.    Goes to psychiatrist and had Latuda added to her Buspar. Does not know if her current pains are due to [place]. Major Pschological stressor is dealing with her autistic child. Feels drained and depressed. Says she has "a lot of pressure". Feels overwhelmed. Goes to psychiatrist in three days on f/u.   Has an appointment with neurology due to her generalized pains and weakness in both hands, right greater than left. Has difficulty closing right hand due to pain.     Outpatient Medications Prior to Visit  Medication Sig Dispense Refill  . busPIRone (BUSPAR) 10 MG tablet Take 10 mg by mouth 2 (two) times daily with a meal.  0  . gabapentin (NEURONTIN) 300 MG capsule Take 2 capsules (600 mg total) by mouth 3 (three) times daily. 180 capsule 2  . hydrOXYzine (ATARAX/VISTARIL) 25 MG tablet Take 1 tablet (25 mg total) by mouth 3 (three) times daily as needed. 30 tablet 0  . risperiDONE (RISPERDAL) 1 MG tablet Take 1 tablet (1 mg total) by mouth at bedtime. 30 tablet 5  . fluconazole (DIFLUCAN) 150 MG tablet Take 1 tablet (150 mg total) by mouth every 3 (three) days. 2 tablet 0  . Cholecalciferol (VITAMIN D3) 5000 units CAPS Take 1 capsule (5,000 Units total) by mouth daily. (Patient not taking: Reported on 06/17/2017) 30 capsule 0   No facility-administered medications prior to visit.      ROS Review of Systems   Constitutional: Positive for malaise/fatigue. Negative for chills and fever.  Eyes: Negative for blurred vision.  Respiratory: Negative for shortness of breath.   Cardiovascular: Negative for chest pain and palpitations.  Gastrointestinal: Negative for abdominal pain and nausea.  Genitourinary: Negative for dysuria and hematuria.  Musculoskeletal: Positive for joint pain and myalgias.  Skin: Negative for rash.  Neurological: Negative for tingling and headaches.  Psychiatric/Behavioral: Positive for depression. The patient is not nervous/anxious.     Objective:  BP 129/87 (BP Location: Left Arm, Patient Position: Sitting, Cuff Size: Normal)   Pulse 82   Temp 97.7 F (36.5 C) (Oral)   Wt 175 lb 6.4 oz (79.6 kg)   LMP 06/12/2017 (Exact Date)   SpO2 92%   BMI 32.08 kg/m   BP/Weight 06/17/2017 05/06/2017 09/25/4654  Systolic BP 812 751 700  Diastolic BP 87 68 74  Wt. (Lbs) 175.4 165.6 162  BMI 32.08 30.29 29.63      Physical Exam  Constitutional: She is oriented to person, place, and time.  Well developed, well nourished, NAD, polite  HENT:  Head: Normocephalic and atraumatic.  Eyes: No scleral icterus.  Neck: Normal range of motion. Neck supple. No thyromegaly present.  Cardiovascular: Normal rate, regular rhythm and normal heart sounds.  Pulmonary/Chest: Effort normal and breath sounds normal.  Musculoskeletal: She exhibits no edema.  Neurological: She is alert and oriented to person,  place, and time. No cranial nerve deficit. Coordination normal.  Skin: Skin is warm and dry. No rash noted. No erythema. No pallor.  Psychiatric: She has a normal mood and affect. Her behavior is normal. Thought content normal.  Constricted affect, slow speech  Vitals reviewed.    Assessment & Plan:     1. Chronic pain syndrome - Begin gabapentin (NEURONTIN) 300 MG capsule; Take 3 capsules (900 mg total) by mouth 3 (three) times daily.  Dispense: 180 capsule; Refill: 2 - Begin  acetaminophen-codeine (TYLENOL #3) 300-30 MG tablet; Take 1 tablet by mouth at bedtime for 7 days.  Dispense: 7 tablet; Refill: 0 - HgB A1c 5.9%. Ordered for pt reassurance.  - Comprehensive metabolic panel to monitor renal function while taking gabapentin.    Meds ordered this encounter  Medications  . gabapentin (NEURONTIN) 300 MG capsule    Sig: Take 3 capsules (900 mg total) by mouth 3 (three) times daily.    Dispense:  180 capsule    Refill:  2    Order Specific Question:   Supervising Provider    Answer:   Tresa Garter [id number]  . acetaminophen-codeine (TYLENOL #3) 300-30 MG tablet    Sig: Take 1 tablet by mouth at bedtime for 7 days.    Dispense:  7 tablet    Refill:  0    Order Specific Question:   Supervising Provider    Answer:   Tresa Garter [id number]    Follow-up: Return in about 8 weeks (around 08/12/2017).   Clent Demark PA

## 2017-06-17 NOTE — Patient Instructions (Signed)

## 2017-06-18 ENCOUNTER — Telehealth (INDEPENDENT_AMBULATORY_CARE_PROVIDER_SITE_OTHER): Payer: Self-pay

## 2017-06-18 LAB — COMPREHENSIVE METABOLIC PANEL
ALBUMIN: 4.6 g/dL (ref 3.5–5.5)
ALK PHOS: 109 IU/L (ref 39–117)
ALT: 27 IU/L (ref 0–32)
AST: 21 IU/L (ref 0–40)
Albumin/Globulin Ratio: 1.9 (ref 1.2–2.2)
BUN / CREAT RATIO: 17 (ref 9–23)
BUN: 15 mg/dL (ref 6–20)
CHLORIDE: 104 mmol/L (ref 96–106)
CO2: 26 mmol/L (ref 20–29)
CREATININE: 0.86 mg/dL (ref 0.57–1.00)
Calcium: 9.4 mg/dL (ref 8.7–10.2)
GFR calc Af Amer: 101 mL/min/{1.73_m2} (ref >59)
GFR calc non Af Amer: 88 mL/min/{1.73_m2} (ref >59)
GLOBULIN, TOTAL: 2.4 g/dL (ref 1.5–4.5)
Glucose: 76 mg/dL (ref 65–99)
POTASSIUM: 4.7 mmol/L (ref 3.5–5.2)
SODIUM: 142 mmol/L (ref 134–144)
Total Protein: 7 g/dL (ref 6.0–8.5)

## 2017-06-18 NOTE — Telephone Encounter (Signed)
Patient is aware of normal CMP. Nat Christen, CMA

## 2017-06-18 NOTE — Telephone Encounter (Signed)
-----   Message from Clent Demark, PA-C sent at 06/18/2017  3:23 PM EST ----- CMP normal.

## 2017-06-20 DIAGNOSIS — F3131 Bipolar disorder, current episode depressed, mild: Secondary | ICD-10-CM | POA: Diagnosis not present

## 2017-07-01 ENCOUNTER — Ambulatory Visit: Payer: Medicare Other | Admitting: Neurology

## 2017-07-24 ENCOUNTER — Emergency Department (HOSPITAL_COMMUNITY)
Admission: EM | Admit: 2017-07-24 | Discharge: 2017-07-24 | Disposition: A | Discharge status: Home / Self Care | Payer: Medicare Other | Attending: Emergency Medicine | Admitting: Emergency Medicine

## 2017-07-24 ENCOUNTER — Emergency Department (HOSPITAL_COMMUNITY): Payer: Medicare Other

## 2017-07-24 ENCOUNTER — Other Ambulatory Visit: Payer: Self-pay

## 2017-07-24 ENCOUNTER — Ambulatory Visit: Payer: Self-pay | Admitting: Neurology

## 2017-07-24 ENCOUNTER — Encounter (HOSPITAL_COMMUNITY): Payer: Self-pay

## 2017-07-24 DIAGNOSIS — Z5321 Procedure and treatment not carried out due to patient leaving prior to being seen by health care provider: Secondary | ICD-10-CM | POA: Insufficient documentation

## 2017-07-24 DIAGNOSIS — M25511 Pain in right shoulder: Secondary | ICD-10-CM | POA: Diagnosis not present

## 2017-07-24 NOTE — ED Notes (Signed)
Patient approached nurse station stating she is leaving at this time due to having to pick up her daughter.  States she will return if symptoms persist.
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
## 2017-07-24 NOTE — ED Notes (Signed)
Went to talk with patient and she was not in the room. Notified PA.

## 2017-08-13 ENCOUNTER — Ambulatory Visit (INDEPENDENT_AMBULATORY_CARE_PROVIDER_SITE_OTHER): Payer: Medicare Other | Admitting: Physician Assistant

## 2017-09-26 DIAGNOSIS — F3131 Bipolar disorder, current episode depressed, mild: Secondary | ICD-10-CM | POA: Diagnosis not present

## 2017-10-09 ENCOUNTER — Encounter (INDEPENDENT_AMBULATORY_CARE_PROVIDER_SITE_OTHER): Payer: Self-pay

## 2017-10-09 ENCOUNTER — Ambulatory Visit: Payer: Medicare Other | Admitting: Neurology

## 2017-10-09 ENCOUNTER — Encounter: Payer: Self-pay | Admitting: Neurology

## 2017-10-09 VITALS — BP 121/78 | HR 72 | Ht 60.0 in | Wt 168.5 lb

## 2017-10-09 DIAGNOSIS — M25551 Pain in right hip: Secondary | ICD-10-CM | POA: Diagnosis not present

## 2017-10-09 DIAGNOSIS — R202 Paresthesia of skin: Secondary | ICD-10-CM

## 2017-10-09 DIAGNOSIS — M25552 Pain in left hip: Secondary | ICD-10-CM

## 2017-10-09 HISTORY — DX: Paresthesia of skin: R20.2

## 2017-10-09 MED ORDER — DULOXETINE HCL 60 MG PO CPEP: 60.0000 mg | ORAL_CAPSULE | Freq: Every day | ORAL | 11 refills | Status: DC

## 2017-10-09 NOTE — Progress Notes (Signed)
PATIENT: Tiffany Juarez DOB: 1981/03/17  Chief Complaint  Patient presents with  . Numbness    Reports numbness, tingling and pain in her bilateral wrists, hands, fingers, ankles and feet.  These symptoms occur intermittently and have been present for around five years.  . Headache    She has a headache at least three days per week.  She does not medicate her pain and they usually resolve within several hours.  She has to lay down in a dark room to help alleviate the pain.  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  She has history of bipolar disorder, she was treated with different medications since she was 37 years old, currently on polypharmacy treatment, BuSpar 15 mg daily, lamotrigine 50 mg, Latuda 40 mg daily since August 2018,  also gabapentin 900 mg 3 times a day, which only took the edge off her pain.  She has constellation of complaints,  Significant bilateral hip pain since 2014, no clear triggers, now constant 5 out of 10 without movement, much worsening with any weightbearing such as walking, bending over, standing,  She also complains of mild midline low back pain, no radiating pain, concurrent with her hip pain, she also noticed bilateral hand and feet swelling, numbness tingling, burning pain, bilateral ankle and wrist swelling, pain, she denies bowel and bladder incontinence,  She has history of chronic headaches getting worse since August 2018, holoacranial pressure headache, denies significant nausea, mild light sensitivity, lasting for hours to days, she is not taking any medications for it.  She also complains of mild low cervical pain, radiating pain to bilateral shoulder,  Laboratory evaluation in November 2018 showed normal A1c, 5.9, CMP,  REVIEW OF SYSTEMS: Full 14 system review of systems  performed and notable only for weight gain, weight loss, blurred vision, ringing the ears, spinning sensation, joint pain, achy muscles, headaches, numbness, insomnia, depression, anxiety, racing thoughts.    ALLERGIES: Allergies  Allergen Reactions  . Cephalosporins Anaphylaxis  . Penicillins Anaphylaxis    HOME MEDICATIONS: Current Outpatient Medications  Medication Sig Dispense Refill  . busPIRone (BUSPAR) 15 MG tablet Take 15 mg by mouth daily.  4  . Cholecalciferol (VITAMIN D3) 5000 units CAPS Take 1 capsule (5,000 Units total) by mouth daily. 30 capsule 0  . gabapentin (NEURONTIN) 300 MG capsule Take 3 capsules (900 mg total) by mouth 3 (three) times daily. 180 capsule 2  . hydrOXYzine (ATARAX/VISTARIL) 25 MG tablet Take 1 tablet (25 mg total) by mouth 3 (three) times daily as needed. 30 tablet 0  . lamoTRIgine (LAMICTAL) 25 MG tablet Take 50 mg by mouth daily.  3  . LATUDA 40 MG TABS tablet Take 40 mg by mouth daily.  3   No current facility-administered medications for this visit.     PAST MEDICAL HISTORY: Past Medical History:  Diagnosis Date  . Anginal pain ([place])   . Bipolar disorder ([place])   . Depression   . Fibroid   . Numbness and tingling     PAST SURGICAL HISTORY: Past Surgical History:  Procedure Laterality Date  . CESAREAN SECTION      FAMILY HISTORY: Family History  Problem Relation Age of Onset  . Depression Father   . Schizophrenia Father   . Depression Brother   .  Healthy Mother   . Hypertension Maternal Grandmother   . Diabetes Maternal Aunt     SOCIAL HISTORY:  Social History   Socioeconomic History  . Marital status: Single    Spouse name: Not on file  . Number of children: 3  . Years of education: 25  . Highest education level: High school graduate  Social Needs  . Financial resource strain: Not on file  . Food insecurity - worry: Not on file  . Food insecurity - inability: Not on file  . Transportation needs - medical: Not on  file  . Transportation needs - non-medical: Not on file  Occupational History  . Occupation: Unemployed  Tobacco Use  . Smoking status: Current Every Day Smoker    Packs/day: 0.50    Types: Cigarettes  . Smokeless tobacco: Never Used  Substance and Sexual Activity  . Alcohol use: No    Comment: quit 2014  . Drug use: No  . Sexual activity: Not Currently    Birth control/protection: None  Other Topics Concern  . Not on file  Social History Narrative   Lives at home with family.   Right-handed.   2 cups caffeine per day.     PHYSICAL EXAM   Vitals:   10/09/17 0945  BP: 121/78  Pulse: 72  Weight: 168 lb 8 oz (76.4 kg)  Height: 5' (1.524 m)    Not recorded      Body mass index is 32.91 kg/m.  PHYSICAL EXAMNIATION:  Gen: NAD, conversant, well nourised, obese, well groomed                     Cardiovascular: Regular rate rhythm, no peripheral edema, warm, nontender. Eyes: Conjunctivae clear without exudates or hemorrhage Neck: Supple, no carotid bruits. Pulmonary: Clear to auscultation bilaterally   NEUROLOGICAL EXAM:  MENTAL STATUS: Speech:    Speech is normal; fluent and spontaneous with normal comprehension.  Cognition:     Orientation to time, place and person     Normal recent and remote memory     Normal Attention span and concentration     Normal Language, naming, repeating,spontaneous speech     Fund of knowledge   CRANIAL NERVES: CN II: Visual fields are full to confrontation. Fundoscopic exam is normal with sharp discs and no vascular changes. Pupils are round equal and briskly reactive to light. CN III, IV, VI: extraocular movement are normal. No ptosis. CN V: Facial sensation is intact to pinprick in all 3 divisions bilaterally. Corneal responses are intact.  CN VII: Face is symmetric with normal eye closure and smile. CN VIII: Hearing is normal to rubbing fingers CN IX, X: Palate elevates symmetrically. Phonation is normal. CN XI: Head turning  and shoulder shrug are intact CN XII: Tongue is midline with normal movements and no atrophy.  MOTOR: There is no pronator drift of out-stretched arms. Muscle bulk and tone are normal. Muscle strength is normal.  REFLEXES: Reflexes are 2+ and symmetric at the biceps, triceps, knees, and ankles. Plantar responses are flexor.  SENSORY: Intact to light touch, pinprick, positional sensation and vibratory sensation are intact in fingers and toes.  COORDINATION: Rapid alternating movements and fine finger movements are intact. There is no dysmetria on finger-to-nose and heel-knee-shin.    GAIT/STANCE: Posture is normal. Gait is steady with normal steps, base, arm swing, and turning. Heel and toe walking are normal. Tandem gait is normal.  Romberg is absent.   DIAGNOSTIC DATA (LABS, IMAGING, TESTING) -  I reviewed patient records, labs, notes, testing and imaging myself where available.   ASSESSMENT AND PLAN  Daaiyah Baumert is a 37 y.o. female   Chronic bilateral hip pain Chronic headaches, Paresthesia  EMG nerve conduction study to rule out neuropathy, lumbosacral radiculopathy  Add on Cymbalta 60 mg daily for her diffuse body achy pain  Laboratory evaluation   X-ray of bilateral hip    Marcial Pacas, M.D. Ph.D.  [facility] [address] [place], [place] [address] Ph: [phone] Fax: [phone]  CC: Clent Demark, PA-C

## 2017-10-09 NOTE — Patient Instructions (Signed)
[facility]    Address: [address], [place], [place] [address]  Phone: [phone]   

## 2017-10-10 ENCOUNTER — Telehealth: Payer: Self-pay | Admitting: Neurology

## 2017-10-10 LAB — THYROID PANEL WITH TSH
FREE THYROXINE INDEX: 1.9 (ref 1.2–4.9)
T3 UPTAKE RATIO: 24 % (ref 24–39)
T4 TOTAL: 7.9 ug/dL (ref 4.5–12.0)
TSH: 0.922 u[IU]/mL (ref 0.450–4.500)

## 2017-10-10 LAB — CK: Total CK: 164 U/L (ref 24–173)

## 2017-10-10 LAB — ANA W/REFLEX IF POSITIVE: Anti Nuclear Antibody(ANA): NEGATIVE

## 2017-10-10 LAB — VITAMIN B12: Vitamin B-12: 610 pg/mL (ref 232–1245)

## 2017-10-10 LAB — RPR: RPR Ser Ql: NONREACTIVE

## 2017-10-10 LAB — HIV ANTIBODY (ROUTINE TESTING W REFLEX): HIV Screen 4th Generation wRfx: NONREACTIVE

## 2017-10-10 LAB — SEDIMENTATION RATE: Sed Rate: 18 mm/hr (ref 0–32)

## 2017-10-10 LAB — FOLATE: Folate: 8.2 ng/mL (ref >3.0)

## 2017-10-10 LAB — C-REACTIVE PROTEIN: CRP: 6.3 mg/L — AB (ref 0.0–4.9)

## 2017-10-10 NOTE — Telephone Encounter (Signed)
Please call patient, elevated C-reactive protein, which is a nonspecific findings, rest of the laboratory evaluations were normal.

## 2017-10-11 ENCOUNTER — Ambulatory Visit
Admission: RE | Admit: 2017-10-11 | Discharge: 2017-10-11 | Disposition: A | Discharge status: Home / Self Care | Payer: Medicare Other | Source: Ambulatory Visit | Attending: Neurology | Admitting: Neurology

## 2017-10-11 DIAGNOSIS — M25552 Pain in left hip: Secondary | ICD-10-CM

## 2017-10-11 DIAGNOSIS — M25551 Pain in right hip: Secondary | ICD-10-CM

## 2017-10-11 DIAGNOSIS — M160 Bilateral primary osteoarthritis of hip: Secondary | ICD-10-CM | POA: Diagnosis not present

## 2017-10-11 DIAGNOSIS — R202 Paresthesia of skin: Secondary | ICD-10-CM

## 2017-10-11 NOTE — Telephone Encounter (Signed)
Spoke to pt and relayed that her labs mostly normal.  CRP or c reactive protein was elevated,  non specific for inflammation.  She verbalized understanding.  She relayed had Hip xray today.  I stated when reported and reviewed by Dr. Krista Blue will received call back, most likely early next week.

## 2017-10-14 ENCOUNTER — Telehealth: Payer: Self-pay | Admitting: Neurology

## 2017-10-14 NOTE — Telephone Encounter (Signed)
Spoke to patient - she is aware of x-ray results.

## 2017-10-14 NOTE — Telephone Encounter (Signed)
Please call patient, x-ray of pelvic and hip showed evidence of degenerative changes, no acute abnormalities.   IMPRESSION: 1. No acute abnormality identified. No evidence of fracture dislocation.  2. Mild bilateral degenerative change. Questionable subchondral cyst right femoral head.

## 2017-10-18 ENCOUNTER — Ambulatory Visit (INDEPENDENT_AMBULATORY_CARE_PROVIDER_SITE_OTHER): Payer: Medicare Other | Admitting: Neurology

## 2017-10-18 DIAGNOSIS — Z0289 Encounter for other administrative examinations: Secondary | ICD-10-CM

## 2017-10-18 DIAGNOSIS — M25552 Pain in left hip: Secondary | ICD-10-CM

## 2017-10-18 DIAGNOSIS — R202 Paresthesia of skin: Secondary | ICD-10-CM

## 2017-10-18 DIAGNOSIS — M25551 Pain in right hip: Secondary | ICD-10-CM

## 2017-10-18 NOTE — Procedures (Signed)
Full Name: Tiffany Juarez Gender: Female MRN #: [id number] Date of Birth: September 24, 1980    Visit Date: 10/18/17 11:41 Age: 37 Years 14 Months Old Examining Physician: Marcial Pacas, MD  Referring Physician: Krista Blue, MD History: 37 years old female, presented with bilateral hands paresthesia, diffuse body achy pain,  Summary of the tests:  Nerve conduction study: Left sural, superficial peroneal sensory responses were normal.  Left tibial, peroneal to EDB motor responses were normal.  Bilateral ulnar sensory responses were normal.  Right median sensory response was absent.  Left median sensory response showed mildly prolonged peak latency.  Left median motor responses were normal.  Right median motor responses showed mildly prolonged peak latency with normal C map amplitude, conduction velocity.  Electromyography: Selective needle examinations were performed at right upper and lower extremity muscles.  The only abnormalities mildly decreased recruitment pattern at the right abductor pollicis brevis.  Conclusion: This is an abnormal study.  There is electrodiagnostic evidence of bilateral median neuropathy across the wrist, consistent with bilateral carpal tunnel syndromes, right side is moderate, left side is mild, mainly demyelinating nature.  There is no evidence of large fiber peripheral neuropathy, or inflammatory myopathy.    ------------------------------- Marcial Pacas, M.D.  [facility] [place], [place] [address] Tel: [phone] Fax: [phone]        [facility]    Nerve / Sites Muscle Latency Ref. Amplitude Ref. Rel Amp Segments Distance Velocity Ref. Area    ms ms mV mV %  cm m/s m/s mVms  R Median - APB     Wrist APB 5.1 ?4.4 8.8 ?4.0 100 Wrist - APB 7   31.1     Upper arm APB 8.6  8.8  99.7 Upper arm - Wrist 18 50 ?49 32.0  L Median - APB     Wrist APB 3.7 ?4.4 7.9 ?4.0 100 Wrist - APB 7   26.5     Upper arm APB 7.1  7.7  98 Upper arm - Wrist  18 52 ?49 25.6  R Ulnar - ADM     Wrist ADM 2.7 ?3.3 10.1 ?6.0 100 Wrist - ADM 7   29.2     B.Elbow ADM 5.5  10.0  99 B.Elbow - Wrist 16 57 ?49 28.9     A.Elbow ADM 7.3  9.8  97.9 A.Elbow - B.Elbow 10 55 ?49 28.7         A.Elbow - Wrist      L Peroneal - EDB     Ankle EDB 4.0 ?6.5 9.5 ?2.0 100 Ankle - EDB 9   29.0     Fib head EDB 9.0  9.4  99.3 Fib head - Ankle 27 54 ?44 29.4     Pop fossa EDB 10.8  9.5  101 Pop fossa - Fib head 10 55 ?44 28.1         Pop fossa - Ankle      L Tibial - AH     Ankle AH 3.8 ?5.8 12.6 ?4.0 100 Ankle - AH 9   28.1     Pop fossa AH 10.8  9.5  75.6 Pop fossa - Ankle 33 47 ?41 23.8               SNC    Nerve / Sites Rec. Site Peak Lat Ref.  Amp Ref. Segments Distance Peak Diff Ref.    ms ms V V  cm ms ms  L Sural -  Ankle (Calf)     Calf Ankle 3.1 ?4.4 49 ?6 Calf - Ankle 14    L Superficial peroneal - Ankle     Lat leg Ankle 3.4 ?4.4 20 ?6 Lat leg - Ankle 14    R Median, Ulnar - Transcarpal comparison     Median Palm Wrist 2.7 ?2.2 23 ?35 Median Palm - Wrist 8       Ulnar Palm Wrist 1.9 ?2.2 23 ?12 Ulnar Palm - Wrist 8          Median Palm - Ulnar Palm  0.8 ?0.4  R Median - Orthodromic (Dig II, Mid palm)     Dig II Wrist NR ?3.4 NR ?10 Dig II - Wrist 13    L Median - Orthodromic (Dig II, Mid palm)     Dig II Wrist 3.6 ?3.4 10 ?10 Dig II - Wrist 13    R Ulnar - Orthodromic, (Dig V, Mid palm)     Dig V Wrist 2.6 ?3.1 14 ?5 Dig V - Wrist 11    L Ulnar - Orthodromic, (Dig V, Mid palm)     Dig V Wrist 2.4 ?3.1 20 ?5 Dig V - Wrist 61                     F  Wave    Nerve F Lat Ref.   ms ms  L Tibial - AH 42.5 ?56.0  R Ulnar - ADM 23.9 ?32.0         EMG full       EMG Summary Table    Spontaneous MUAP Recruitment  Muscle IA Fib PSW Fasc Other Amp Dur. Poly Pattern  R. Abductor pollicis brevis Normal None None None _______ Normal Normal Normal Reduced  R. Pronator teres Normal None None None _______ Normal Normal Normal Normal  R. Biceps brachii  Normal None None None _______ Normal Normal Normal Normal  R. Deltoid Normal None None None _______ Normal Normal Normal Normal  R. Triceps brachii Normal None None None _______ Normal Normal Normal Normal  R. Tibialis anterior Normal None None None _______ Normal Normal Normal Normal  R. Tibialis posterior Normal None None None _______ Normal Normal Normal Normal  R. Peroneus longus Normal None None None _______ Normal Normal Normal Normal  R. Gastrocnemius (Medial head) Normal None None None _______ Normal Normal Normal Normal  R. Vastus lateralis Normal None None None _______ Normal Normal Normal Normal  R. Biceps femoris (long head) Normal None None None _______ Normal Normal Normal Normal  R. Lumbar paraspinals (mid) Normal None None None _______ Normal Normal Normal Normal  R. Lumbar paraspinals (low) Normal None None None _______ Normal Normal Normal Normal  R. Cervical paraspinals Normal None None None _______ Normal Normal Normal Normal

## 2017-11-07 DIAGNOSIS — F3131 Bipolar disorder, current episode depressed, mild: Secondary | ICD-10-CM | POA: Diagnosis not present

## 2017-11-08 ENCOUNTER — Other Ambulatory Visit (INDEPENDENT_AMBULATORY_CARE_PROVIDER_SITE_OTHER): Payer: Self-pay | Admitting: Physician Assistant

## 2017-11-08 DIAGNOSIS — G894 Chronic pain syndrome: Secondary | ICD-10-CM

## 2017-11-11 NOTE — Telephone Encounter (Signed)
FWD to PCP. Tempestt S Roberts, CMA  

## 2017-11-13 ENCOUNTER — Encounter (INDEPENDENT_AMBULATORY_CARE_PROVIDER_SITE_OTHER): Payer: Self-pay | Admitting: Physician Assistant

## 2017-11-13 ENCOUNTER — Other Ambulatory Visit: Payer: Self-pay

## 2017-11-13 ENCOUNTER — Ambulatory Visit (INDEPENDENT_AMBULATORY_CARE_PROVIDER_SITE_OTHER): Payer: Medicare Other | Admitting: Physician Assistant

## 2017-11-13 VITALS — BP 114/77 | HR 79 | Temp 97.9°F | Ht 60.0 in | Wt 172.6 lb

## 2017-11-13 DIAGNOSIS — G894 Chronic pain syndrome: Secondary | ICD-10-CM

## 2017-11-13 DIAGNOSIS — G5603 Carpal tunnel syndrome, bilateral upper limbs: Secondary | ICD-10-CM | POA: Diagnosis not present

## 2017-11-13 MED ORDER — GABAPENTIN 300 MG PO CAPS: ORAL_CAPSULE | ORAL | 11 refills | Status: DC

## 2017-11-13 NOTE — Progress Notes (Signed)
Subjective:  Patient ID: Tiffany Juarez, female    DOB: 18-Sep-1980  Age: 37 y.o. MRN: [id number]  CC:   HPI Tiffany Juarez a 37 y.o.femalewith a medical history of bipolar disorder, chronic pain syndrome, and angina presents to f/u on chronic generalized pain. Saw neurologist last month and prescribed Cymbalta for her pains/paresthesias which has been helpful in reducing general pains at approximately 75%. NCS revealed likely CTS bilaterally. Neurologist did not recommend surgery and advised to wear wrist splints which patient has not used regularly to this point.  Previous labs revealed negative ANA, TSH, CBC, CMP, Lyme dz, Allergen zone 2, ESR, CRP.Gabapentin was increased last visit and is helping with her pains.     Goes to psychiatrist and was told she can use Cymbalta with [place]. Does not feel drained as before and depressed is somewhat better. Says she has "a lot of pressure". Feels overwhelmed sometimes.   Outpatient Medications Prior to Visit  Medication Sig Dispense Refill  . busPIRone (BUSPAR) 15 MG tablet Take 15 mg by mouth daily.  4  . DULoxetine (CYMBALTA) 60 MG capsule Take 1 capsule (60 mg total) by mouth daily. 30 capsule 11  . gabapentin (NEURONTIN) 300 MG capsule TAKE 3 CAPSULES BY MOUTH THREE TIMES A DAY 180 capsule 0  . hydrOXYzine (ATARAX/VISTARIL) 25 MG tablet Take 1 tablet (25 mg total) by mouth 3 (three) times daily as needed. 30 tablet 0  . lamoTRIgine (LAMICTAL) 25 MG tablet Take 50 mg by mouth daily.  3  . LATUDA 40 MG TABS tablet Take 40 mg by mouth daily.  3  . Cholecalciferol (VITAMIN D3) 5000 units CAPS Take 1 capsule (5,000 Units total) by mouth daily. 30 capsule 0   No facility-administered medications prior to visit.      ROS Review of Systems  Constitutional: Negative for chills, fever and malaise/fatigue.  Eyes: Negative for blurred vision.  Respiratory: Negative for shortness of breath.   Cardiovascular: Negative for chest pain and palpitations.   Gastrointestinal: Negative for abdominal pain and nausea.  Genitourinary: Negative for dysuria and hematuria.  Musculoskeletal: Positive for back pain, joint pain, myalgias and neck pain.  Skin: Negative for rash.  Neurological: Negative for tingling and headaches.  Psychiatric/Behavioral: Positive for depression. The patient is not nervous/anxious.     Objective:  BP 114/77 (BP Location: Left Arm, Patient Position: Sitting, Cuff Size: Large)   Pulse 79   Temp 97.9 F (36.6 C) (Oral)   Ht 5' (1.524 m)   Wt 172 lb 9.6 oz (78.3 kg)   LMP 10/30/2017 (Approximate)   SpO2 97%   BMI 33.71 kg/m   BP/Weight 11/13/2017 01/21/6377 11/27/8500  Systolic BP 774 128 786  Diastolic BP 77 78 83  Wt. (Lbs) 172.6 168.5 180  BMI 33.71 32.91 35.15      Physical Exam  Constitutional: She is oriented to person, place, and time.  Well developed, well nourished, NAD, polite  HENT:  Head: Normocephalic and atraumatic.  Eyes: No scleral icterus.  Pulmonary/Chest: Effort normal.  Musculoskeletal: She exhibits no edema.  Neurological: She is alert and oriented to person, place, and time.  Skin: Skin is dry. No rash noted. No erythema. No pallor.  Psychiatric: Her behavior is normal. Thought content normal.  Somehat depressed mood but at baseline. Constricted affect.   Vitals reviewed.    Assessment & Plan:   1. Chronic pain syndrome - Continue Cymbalta - Refill gabapentin (NEURONTIN) 300 MG capsule; TAKE 3 CAPSULES BY MOUTH THREE  TIMES A DAY  Dispense: 270 capsule; Refill: 11  2. Bilateral carpal tunnel syndrome - I have asked for pt to call her neurologist to find out if steroid injection can be done at their office.    Meds ordered this encounter  Medications  . gabapentin (NEURONTIN) 300 MG capsule    Sig: TAKE 3 CAPSULES BY MOUTH THREE TIMES A DAY    Dispense:  270 capsule    Refill:  11    Order Specific Question:   Supervising Provider    Answer:   Tresa Garter [[id number]]     Follow-up: Return if symptoms worsen or fail to improve.   Clent Demark PA

## 2017-11-13 NOTE — Patient Instructions (Signed)
Carpal Tunnel Syndrome Carpal tunnel syndrome is a condition that causes pain in your hand and arm. The carpal tunnel is a narrow area that is on the palm side of your wrist. Repeated wrist motion or certain diseases may cause swelling in the tunnel. This swelling can pinch the main nerve in the wrist (median nerve). Follow these instructions at home: If you have a splint:  Wear it as told by your doctor. Remove it only as told by your doctor.  Loosen the splint if your fingers: ? Become numb and tingle. ? Turn blue and cold.  Keep the splint clean and dry. General instructions  Take over-the-counter and prescription medicines only as told by your doctor.  Rest your wrist from any activity that may be causing your pain. If needed, talk to your employer about changes that can be made in your work, such as getting a wrist pad to use while typing.  If directed, apply ice to the painful area: ? Put ice in a plastic bag. ? Place a towel between your skin and the bag. ? Leave the ice on for 20 minutes, 2-3 times per day.  Keep all follow-up visits as told by your doctor. This is important.  Do any exercises as told by your doctor, physical therapist, or occupational therapist. Contact a doctor if:  You have new symptoms.  Medicine does not help your pain.  Your symptoms get worse. This information is not intended to replace advice given to you by your health care provider. Make sure you discuss any questions you have with your health care provider. Document Released: 06/28/2011 Document Revised: 12/15/2015 Document Reviewed: 11/24/2014 Elsevier Interactive Patient Education  2018 Elsevier Inc.  

## 2017-12-19 DIAGNOSIS — F3131 Bipolar disorder, current episode depressed, mild: Secondary | ICD-10-CM | POA: Diagnosis not present

## 2018-02-06 DIAGNOSIS — F3131 Bipolar disorder, current episode depressed, mild: Secondary | ICD-10-CM | POA: Diagnosis not present

## 2018-02-13 DIAGNOSIS — Z0271 Encounter for disability determination: Secondary | ICD-10-CM

## 2018-02-19 DIAGNOSIS — F3131 Bipolar disorder, current episode depressed, mild: Secondary | ICD-10-CM | POA: Diagnosis not present

## 2018-02-21 DIAGNOSIS — F319 Bipolar disorder, unspecified: Secondary | ICD-10-CM

## 2018-03-13 DIAGNOSIS — F3131 Bipolar disorder, current episode depressed, mild: Secondary | ICD-10-CM | POA: Diagnosis not present

## 2018-03-18 ENCOUNTER — Encounter: Payer: Self-pay | Admitting: Family Medicine

## 2018-03-18 ENCOUNTER — Ambulatory Visit (INDEPENDENT_AMBULATORY_CARE_PROVIDER_SITE_OTHER): Payer: Medicare Other | Admitting: Family Medicine

## 2018-03-18 VITALS — BP 120/70 | HR 82 | Temp 98.2°F | Resp 16 | Ht 60.5 in | Wt 205.0 lb

## 2018-03-18 DIAGNOSIS — M25551 Pain in right hip: Secondary | ICD-10-CM | POA: Diagnosis not present

## 2018-03-18 DIAGNOSIS — R7303 Prediabetes: Secondary | ICD-10-CM | POA: Diagnosis not present

## 2018-03-18 DIAGNOSIS — G894 Chronic pain syndrome: Secondary | ICD-10-CM

## 2018-03-18 DIAGNOSIS — M25552 Pain in left hip: Secondary | ICD-10-CM

## 2018-03-18 DIAGNOSIS — E669 Obesity, unspecified: Secondary | ICD-10-CM

## 2018-03-18 DIAGNOSIS — F319 Bipolar disorder, unspecified: Secondary | ICD-10-CM

## 2018-03-18 NOTE — Patient Instructions (Signed)
I am referring you to a pain management specialist.  You will receive a phone call from them in the next few weeks to schedule appointment. Continue seeing your psychiatrist and therapist  Try using a free app such as my fitness pal to keep track of your calorie intake. Cut back on carbohydrates especially potatoes, bread, rice, pasta. Try to increase your physical activity as tolerated.  We will call you with your lab results.

## 2018-03-18 NOTE — Progress Notes (Addendum)
   Subjective:    Patient ID: Tiffany Juarez, female    DOB: March 11, 1981, 36 y.o.   MRN: [id number]  HPI Chief Complaint  Patient presents with  . get established    new pt get established. weight gain, all over pain in body. no other concerns.   She is new to the practice and here to establish care.  Previous medical care: Dr. Domenica Fail [facility].   Reports having chronic bilateral hip pain. This has been ongoing for the past 4 years and unchanged. States she has never seen a pain management specialist in the past.  Reports pain "all over" and would like to understand why she is having this pain.   Denies fever, chills, dizziness, chest pain, palpitations, shortness of breath, abdominal pain, N/V/D, urinary symptoms, LE edema.  Has seen neurologist for intermittent paresthesias. Diagnosed with CTS.  No focal weakness.    She is also concerned about her weight. Reports gradual weight gain. States she is only eating one meal per day. Does not know how many calories she is consuming. Denies drinking water very often.   States she started on metformin 2 weeks ago. History of prediabetes with her last Hgb A1c 5.9% in 2018.  States she is unable to exercise.    Other providers:  Neurologist- Dr. Krista Blue  Psychiatrist - Dr. Casimiro Needle  Therapist at [facility]   Reports having bipolar disorder which is managed by her psychiatrist and therapist.   She is not working. States she is disabled due to mental health issues.  She has 3 kids ages 71,13 and 34.  Single with a significant other.    LMP: 03/06/2018   States she smokes cigarettes but quitting by vaping.    Review of Systems Pertinent positives and negatives in the history of present illness.     Objective:   Physical Exam BP 120/70   Pulse 82   Temp 98.2 F (36.8 C) (Oral)   Resp 16   Ht 5' 0.5" (1.537 m)   Wt 205 lb (93 kg)   LMP 03/06/2018   SpO2 99%   BMI 39.38 kg/m   Alert and in no distress.  Pharyngeal area is normal. Neck is supple without adenopathy or thyromegaly. Cardiac exam shows a regular sinus rhythm without murmurs or gallops. Lungs are clear to auscultation. Extremities without edema. Skin is warm and dry. PERRLA, EOMs intact, CNs intact. Gait normal.       Assessment & Plan:  Chronic pain syndrome  Obesity (BMI 30-39.9) - Plan: CBC with Differential/Platelet, Comprehensive metabolic panel, TSH, T4, free, Hemoglobin A1c  Prediabetes - Plan: CBC with Differential/Platelet, Comprehensive metabolic panel, TSH, T4, free, Hemoglobin A1c  Pain of both hip joints  Bipolar affective disorder, remission status unspecified ([place])  She will continue on current medications at this time.  Refer to pain management specialist.  Discussed that I do not treat chronic pain. Continue seeing a psychiatrist and therapist for mental health issues. Discussed in depth how to improve diet in order to lose weight.  Advised she cut back on carbohydrates specifically and portion sizes.  Recommend eating small frequent meals as opposed to one large meal. Recommend movement to improve pain.  History of prediabetes.  States she started on metformin to suppress her appetite 2 weeks ago. Check labs and follow-up as needed.

## 2018-03-19 LAB — COMPREHENSIVE METABOLIC PANEL
ALBUMIN: 4.3 g/dL (ref 3.5–5.5)
ALK PHOS: 121 IU/L — AB (ref 39–117)
ALT: 16 IU/L (ref 0–32)
AST: 15 IU/L (ref 0–40)
Albumin/Globulin Ratio: 1.9 (ref 1.2–2.2)
BUN/Creatinine Ratio: 8 — ABNORMAL LOW (ref 9–23)
BUN: 8 mg/dL (ref 6–20)
Bilirubin Total: <0.2 mg/dL (ref 0.0–1.2)
CHLORIDE: 102 mmol/L (ref 96–106)
CO2: 23 mmol/L (ref 20–29)
CREATININE: 0.99 mg/dL (ref 0.57–1.00)
Calcium: 9.4 mg/dL (ref 8.7–10.2)
GFR calc Af Amer: 85 mL/min/{1.73_m2} (ref >59)
GFR calc non Af Amer: 74 mL/min/{1.73_m2} (ref >59)
GLUCOSE: 76 mg/dL (ref 65–99)
Globulin, Total: 2.3 g/dL (ref 1.5–4.5)
POTASSIUM: 4.8 mmol/L (ref 3.5–5.2)
SODIUM: 142 mmol/L (ref 134–144)
TOTAL PROTEIN: 6.6 g/dL (ref 6.0–8.5)

## 2018-03-19 LAB — HEMOGLOBIN A1C
Est. average glucose Bld gHb Est-mCnc: 128 mg/dL
HEMOGLOBIN A1C: 6.1 % — AB (ref 4.8–5.6)

## 2018-03-19 LAB — CBC WITH DIFFERENTIAL/PLATELET
BASOS ABS: 0 10*3/uL (ref 0.0–0.2)
Basos: 0 %
EOS (ABSOLUTE): 0.3 10*3/uL (ref 0.0–0.4)
EOS: 3 %
HEMATOCRIT: 35.1 % (ref 34.0–46.6)
HEMOGLOBIN: 11.8 g/dL (ref 11.1–15.9)
Immature Grans (Abs): 0 10*3/uL (ref 0.0–0.1)
Immature Granulocytes: 0 %
LYMPHS ABS: 2.4 10*3/uL (ref 0.7–3.1)
Lymphs: 28 %
MCH: 27.1 pg (ref 26.6–33.0)
MCHC: 33.6 g/dL (ref 31.5–35.7)
MCV: 81 fL (ref 79–97)
MONOCYTES: 6 %
Monocytes Absolute: 0.5 10*3/uL (ref 0.1–0.9)
Neutrophils Absolute: 5.3 10*3/uL (ref 1.4–7.0)
Neutrophils: 63 %
Platelets: 285 10*3/uL (ref 150–450)
RBC: 4.36 x10E6/uL (ref 3.77–5.28)
RDW: 14.6 % (ref 12.3–15.4)
WBC: 8.6 10*3/uL (ref 3.4–10.8)

## 2018-03-19 LAB — T4, FREE: FREE T4: 0.83 ng/dL (ref 0.82–1.77)

## 2018-03-19 LAB — TSH: TSH: 2.04 u[IU]/mL (ref 0.450–4.500)

## 2018-03-20 DIAGNOSIS — F3131 Bipolar disorder, current episode depressed, mild: Secondary | ICD-10-CM | POA: Diagnosis not present

## 2018-03-20 DIAGNOSIS — Z79899 Other long term (current) drug therapy: Secondary | ICD-10-CM | POA: Diagnosis not present

## 2018-04-07 ENCOUNTER — Encounter (HOSPITAL_COMMUNITY): Payer: Self-pay

## 2018-04-07 ENCOUNTER — Ambulatory Visit (HOSPITAL_COMMUNITY)
Admission: EM | Admit: 2018-04-07 | Discharge: 2018-04-07 | Disposition: A | Discharge status: Home / Self Care | Payer: Medicare Other | Attending: Family Medicine | Admitting: Family Medicine

## 2018-04-07 DIAGNOSIS — S86011A Strain of right Achilles tendon, initial encounter: Secondary | ICD-10-CM

## 2018-04-07 MED ORDER — ACETAMINOPHEN 500 MG PO TABS: 1000.0000 mg | ORAL_TABLET | Freq: Three times a day (TID) | ORAL | 0 refills | Status: DC | PRN

## 2018-04-07 NOTE — ED Provider Notes (Signed)
[facility]    CSN: [id number] Arrival date & time: 04/07/18  7829     History   Chief Complaint Chief Complaint  Patient presents with  . Leg Pain    Right    HPI Tiffany Juarez is a 37 y.o. female.   [name] presents with complaints of pain to her right achilles which started yesterday. States she was at the store and reached up to the top shelf and felt a stretch and heard a pop which resulted in pain. Has had pain since, pain with walking, primarily if stairs or up any incline. Pain 6/10. Has not taken any medications for pain. States has had similar in the past and required a splint and crutches for achilles injury, has been years ago, in [place]. Some tingling to foot but no numbness or swelling. No other foot complaints. Hx of bipolar, depression, fibroids. She is on lithium therefore restricted with NSAIDS.     ROS per HPI.         Past Medical History:  Diagnosis Date  . Anginal pain ([place])   . Bipolar disorder ([place])   . Depression   . Fibroid   . Numbness and tingling     Patient Active Problem List   Diagnosis Date Noted  . Bipolar disorder ([place])   . Paresthesia 10/09/2017  . Pain of both hip joints 10/09/2017    Past Surgical History:  Procedure Laterality Date  . CESAREAN SECTION      OB History    Gravida  5   Para  2   Term  1   Preterm  1   AB  2   Living  2     SAB  1   TAB  1   Ectopic      Multiple      Live Births  2            Home Medications    Prior to Admission medications   Medication Sig Start Date End Date Taking? Authorizing Provider  acetaminophen (TYLENOL) 500 MG tablet Take 2 tablets (1,000 mg total) by mouth every 8 (eight) hours as needed for mild pain or moderate pain. 04/07/18   Zigmund Gottron, NP  busPIRone (BUSPAR) 15 MG tablet Take 15 mg by mouth daily. 09/26/17   [provider]  DULoxetine (CYMBALTA) 60 MG capsule Take 1 capsule (60 mg total) by mouth daily. Patient  taking differently: Take 120 mg by mouth daily.  10/09/17   Marcial Pacas, MD  gabapentin (NEURONTIN) 300 MG capsule TAKE 3 CAPSULES BY MOUTH THREE TIMES A DAY 11/13/17   Clent Demark, PA-C  lamoTRIgine (LAMICTAL) 200 MG tablet Take 200 mg by mouth daily.    [provider]  lithium carbonate 300 MG capsule Take 300 mg by mouth 2 (two) times daily with a meal.    [provider]  Lurasidone HCl (LATUDA) 60 MG TABS Take by mouth.    [provider]  metFORMIN (GLUCOPHAGE) 500 MG tablet Take 500 mg by mouth daily with breakfast.    [provider]    Family History Family History  Problem Relation Age of Onset  . Depression Father   . Schizophrenia Father   . Depression Brother   . Healthy Mother   . Hypertension Maternal Grandmother   . Diabetes Maternal Aunt     Social History Social History   Tobacco Use  . Smoking status: Current Every Day Smoker  Packs/day: 0.50    Types: Cigarettes  . Smokeless tobacco: Never Used  Substance Use Topics  . Alcohol use: No    Comment: quit 2014  . Drug use: No     Allergies   Cephalosporins and Penicillins   Review of Systems Review of Systems   Physical Exam Triage Vital Signs ED Triage Vitals [04/07/18 0900]  Enc Vitals Group     BP (!) 148/87     Pulse Rate 94     Resp 20     Temp 98.4 F (36.9 C)     Temp Source Oral     SpO2 99 %     Weight      Height      Head Circumference      Peak Flow      Pain Score 10     Pain Loc      Pain Edu?      Excl. in [place]?    No data found.  Updated Vital Signs BP (!) 148/87 (BP Location: Left Arm)   Pulse 94   Temp 98.4 F (36.9 C) (Oral)   Resp 20   LMP 03/27/2018   SpO2 99%   Visual Acuity Right Eye Distance:   Left Eye Distance:   Bilateral Distance:    Right Eye Near:   Left Eye Near:    Bilateral Near:     Physical Exam  Constitutional: She is oriented to person, place, and time. She appears well-developed and  well-nourished. No distress.  Cardiovascular: Normal rate, regular rhythm and normal heart sounds.  Pulmonary/Chest: Effort normal and breath sounds normal.  Musculoskeletal:       Right ankle: She exhibits normal range of motion, no swelling, no ecchymosis, no deformity, no laceration and normal pulse. Achilles tendon exhibits pain. Achilles tendon exhibits no defect and normal Thompson's test results.  Tenderness along achilles on palpation without deformity noted; negative thompson's test; tenderness to calf with squeezing however, no redness, swelling or warmth  Neurological: She is alert and oriented to person, place, and time.  Skin: Skin is warm and dry.     UC Treatments / Results  Labs (all labs ordered are listed, but only abnormal results are displayed) Labs Reviewed - No data to display  EKG None  Radiology No results found.  Procedures Procedures (including critical care time)  Medications Ordered in UC Medications - No data to display  Initial Impression / Assessment and Plan / UC Course  I have reviewed the triage vital signs and the nursing notes.  Pertinent labs & imaging results that were available during my care of the patient were reviewed by me and considered in my medical decision making (see chart for details).     Achilles function remains intact on exam but with tenderness and discomfort s/p stretching injury yesterday. Ankle brace provided for comfort. Encouraged ice and elevation. Use of crutches as needed. Tylenol for pain control Follow up with ortho as needed. Patient verbalized understanding and agreeable to plan.    Final Clinical Impressions(s) / UC Diagnoses   Final diagnoses:  Achilles tendon sprain, right, initial encounter     Discharge Instructions     Ice and elevation of the right foot/achilles, especially after increased activity.  Use of brace we have provided you with activity.  Tylenol every 8 hours to help with pain.    Please follow up with orthopedics for recheck, especially if persistent or worsening of pain.    ED Prescriptions  Medication Sig Dispense Auth. Provider   acetaminophen (TYLENOL) 500 MG tablet Take 2 tablets (1,000 mg total) by mouth every 8 (eight) hours as needed for mild pain or moderate pain. 60 tablet Zigmund Gottron, NP     Controlled Substance Prescriptions [place] Controlled Substance Registry consulted? Not Applicable   Zigmund Gottron, NP 04/07/18 [phone]

## 2018-04-07 NOTE — ED Triage Notes (Signed)
Pt presents with right leg pain after over stretching trying to reach something on a top shelf.

## 2018-04-07 NOTE — Discharge Instructions (Signed)
Ice and elevation of the right foot/achilles, especially after increased activity.  Use of brace we have provided you with activity.  Tylenol every 8 hours to help with pain.  Please follow up with orthopedics for recheck, especially if persistent or worsening of pain.

## 2018-04-09 DIAGNOSIS — F3131 Bipolar disorder, current episode depressed, mild: Secondary | ICD-10-CM | POA: Diagnosis not present

## 2018-04-16 DIAGNOSIS — Z79891 Long term (current) use of opiate analgesic: Secondary | ICD-10-CM | POA: Diagnosis not present

## 2018-04-16 DIAGNOSIS — G894 Chronic pain syndrome: Secondary | ICD-10-CM | POA: Diagnosis not present

## 2018-04-16 DIAGNOSIS — M25559 Pain in unspecified hip: Secondary | ICD-10-CM | POA: Diagnosis not present

## 2018-04-16 DIAGNOSIS — Z79899 Other long term (current) drug therapy: Secondary | ICD-10-CM | POA: Diagnosis not present

## 2018-04-24 DIAGNOSIS — F3131 Bipolar disorder, current episode depressed, mild: Secondary | ICD-10-CM | POA: Diagnosis not present

## 2018-04-29 DIAGNOSIS — M792 Neuralgia and neuritis, unspecified: Secondary | ICD-10-CM | POA: Diagnosis not present

## 2018-04-29 DIAGNOSIS — Z79899 Other long term (current) drug therapy: Secondary | ICD-10-CM | POA: Diagnosis not present

## 2018-04-29 DIAGNOSIS — G609 Hereditary and idiopathic neuropathy, unspecified: Secondary | ICD-10-CM | POA: Diagnosis not present

## 2018-04-29 DIAGNOSIS — F4542 Pain disorder with related psychological factors: Secondary | ICD-10-CM | POA: Diagnosis not present

## 2018-04-29 DIAGNOSIS — Z79891 Long term (current) use of opiate analgesic: Secondary | ICD-10-CM | POA: Diagnosis not present

## 2018-04-29 DIAGNOSIS — G894 Chronic pain syndrome: Secondary | ICD-10-CM | POA: Diagnosis not present

## 2018-04-30 DIAGNOSIS — F3131 Bipolar disorder, current episode depressed, mild: Secondary | ICD-10-CM | POA: Diagnosis not present

## 2018-05-15 ENCOUNTER — Ambulatory Visit (INDEPENDENT_AMBULATORY_CARE_PROVIDER_SITE_OTHER): Payer: Medicare Other | Admitting: Family Medicine

## 2018-05-15 ENCOUNTER — Encounter: Payer: Self-pay | Admitting: Family Medicine

## 2018-05-15 VITALS — BP 118/74 | HR 101 | Temp 98.2°F | Resp 16 | Wt 201.4 lb

## 2018-05-15 DIAGNOSIS — F99 Mental disorder, not otherwise specified: Secondary | ICD-10-CM

## 2018-05-15 DIAGNOSIS — F319 Bipolar disorder, unspecified: Secondary | ICD-10-CM

## 2018-05-15 DIAGNOSIS — R55 Syncope and collapse: Secondary | ICD-10-CM

## 2018-05-15 DIAGNOSIS — N926 Irregular menstruation, unspecified: Secondary | ICD-10-CM | POA: Diagnosis not present

## 2018-05-15 DIAGNOSIS — F513 Sleepwalking [somnambulism]: Secondary | ICD-10-CM

## 2018-05-15 DIAGNOSIS — G8929 Other chronic pain: Secondary | ICD-10-CM

## 2018-05-15 DIAGNOSIS — R4586 Emotional lability: Secondary | ICD-10-CM

## 2018-05-15 LAB — POCT URINE PREGNANCY: Preg Test, Ur: NEGATIVE

## 2018-05-15 LAB — POCT URINALYSIS DIP (PROADVANTAGE DEVICE)
Bilirubin, UA: NEGATIVE
Glucose, UA: NEGATIVE mg/dL
Ketones, POC UA: NEGATIVE mg/dL
Leukocytes, UA: NEGATIVE
NITRITE UA: NEGATIVE
PROTEIN UA: NEGATIVE mg/dL
RBC UA: NEGATIVE
SPECIFIC GRAVITY, URINE: 1.015
UUROB: NEGATIVE
pH, UA: 5.5 (ref 5.0–8.0)

## 2018-05-15 LAB — GLUCOSE, POCT (MANUAL RESULT ENTRY): POC GLUCOSE: 103 mg/dL — AB (ref 70–99)

## 2018-05-15 NOTE — Progress Notes (Signed)
Subjective:    Patient ID: Tiffany Juarez, female    DOB: Jul 16, 1981, 37 y.o.   MRN: [id number]  HPI Chief Complaint  Patient presents with  . fell asleep    fell asleep walking up chairs and fell and hurt herself, then in the middle of night and was sleep walking. - 2 nights ago.    She is here with complaints of "falling asleep while walking one day last week". States she was only asleep for a second and woke up when she hit the ground. She does not recall feeling bad at all. Denies fever, chills, headache, dizziness, chest pain, shortness of breath, abdominal pain, N/V/D.   States this happened again at her house when she was walking in her bedroom a couple of days ago.  She then recalls walking in her sleep. States she was awakened by her boyfriend and she was standing behind her bedroom door.   LMP: unsure if she has one last month.  Does have unprotected sex.   Smokes cigarettes. No vaping. Denies alcohol or drug use.   Under the care of Dr. Martina Sinner for bipolar disorder.   No longer seeing pain management.   She has seen Dr. Krista Blue neurologist in the past.   Reviewed allergies, medications, past medical, surgical, family, and social history.   Review of Systems Pertinent positives and negatives in the history of present illness.     Objective:   Physical Exam  Constitutional: She is oriented to person, place, and time. She appears well-developed and well-nourished. No distress.  HENT:  Head: Atraumatic.  Right Ear: Tympanic membrane and ear canal normal.  Left Ear: Tympanic membrane and ear canal normal.  Nose: Nose normal.  Mouth/Throat: Uvula is midline, oropharynx is clear and moist and mucous membranes are normal.  Eyes: Pupils are equal, round, and reactive to light. Conjunctivae, EOM and lids are normal.  Neck: Trachea normal and full passive range of motion without pain. Neck supple. No JVD present.  Cardiovascular: Normal rate, regular rhythm, normal heart sounds  and intact distal pulses.  No LE edema   Pulmonary/Chest: Effort normal and breath sounds normal.  Lymphadenopathy:    She has no cervical adenopathy.  Neurological: She is alert and oriented to person, place, and time. She has normal strength and normal reflexes. No cranial nerve deficit or sensory deficit. She displays a negative Romberg sign. Gait normal.  No facial asymmetry, pronator drift. Normal finger to nose.   Skin: Skin is warm and dry. Capillary refill takes less than 2 seconds.  Psychiatric: Her speech is normal. Thought content normal. Her affect is labile. She is slowed.   BP 118/74 (BP Location: Right Arm, Patient Position: Standing, Cuff Size: Large)   Pulse (!) 101   Temp 98.2 F (36.8 C) (Oral)   Resp 16   Wt 201 lb 6.4 oz (91.4 kg)   SpO2 98%   BMI 38.69 kg/m       Assessment & Plan:  Sleep walking - Plan: Drug Profile, Ur, 9 Drugs, Ambulatory referral to Neurology  Syncope, unspecified syncope type - Plan: CBC with Differential/Platelet, Comprehensive metabolic panel, T4, free, TSH, POCT Urinalysis DIP (Proadvantage Device), POCT urine pregnancy, Drug Profile, Ur, 9 Drugs, POCT glucose (manual entry), Ambulatory referral to Neurology  Bipolar affective disorder, remission status unspecified ([place])  Other chronic pain  Late menses - Plan: POCT urine pregnancy  Labile mood - Plan: Drug Profile, Ur, 9 Drugs  Abnormal mini-mental status exam - Plan: Ambulatory  referral to Neurology  Abnormal mini mental exam 23- mild cognitive impairment.  PCOT random glucose 103 UA neg  UPT negative She is not orthostatic.  She is euvolemic and in no acute distress but her mood did change throughout her visit.  Her son is here to drive her home. Advised her to avoid driving until cleared by neurologist.  Normal neurological exam.  Referral back to neurologist and to her psychiatrist.  Check labs and follow up.  Strict precautions that is she has another ?syncopal vs  narcoleptic event that she will call 911 or go to the ED.

## 2018-05-16 LAB — COMPREHENSIVE METABOLIC PANEL
ALBUMIN: 4.5 g/dL (ref 3.5–5.5)
ALT: 19 IU/L (ref 0–32)
AST: 15 IU/L (ref 0–40)
Albumin/Globulin Ratio: 1.9 (ref 1.2–2.2)
Alkaline Phosphatase: 129 IU/L — ABNORMAL HIGH (ref 39–117)
BUN / CREAT RATIO: 9 (ref 9–23)
BUN: 9 mg/dL (ref 6–20)
Bilirubin Total: <0.2 mg/dL (ref 0.0–1.2)
CALCIUM: 9.6 mg/dL (ref 8.7–10.2)
CHLORIDE: 103 mmol/L (ref 96–106)
CO2: 24 mmol/L (ref 20–29)
CREATININE: 1.02 mg/dL — AB (ref 0.57–1.00)
GFR calc non Af Amer: 71 mL/min/{1.73_m2} (ref >59)
GFR, EST AFRICAN AMERICAN: 82 mL/min/{1.73_m2} (ref >59)
GLUCOSE: 106 mg/dL — AB (ref 65–99)
Globulin, Total: 2.4 g/dL (ref 1.5–4.5)
Potassium: 4.5 mmol/L (ref 3.5–5.2)
Sodium: 141 mmol/L (ref 134–144)
TOTAL PROTEIN: 6.9 g/dL (ref 6.0–8.5)

## 2018-05-16 LAB — CBC WITH DIFFERENTIAL/PLATELET
BASOS ABS: 0 10*3/uL (ref 0.0–0.2)
Basos: 0 %
EOS (ABSOLUTE): 0.2 10*3/uL (ref 0.0–0.4)
Eos: 3 %
Hematocrit: 38 % (ref 34.0–46.6)
Hemoglobin: 12.1 g/dL (ref 11.1–15.9)
Immature Grans (Abs): 0 10*3/uL (ref 0.0–0.1)
Immature Granulocytes: 0 %
LYMPHS ABS: 2.9 10*3/uL (ref 0.7–3.1)
Lymphs: 37 %
MCH: 26.8 pg (ref 26.6–33.0)
MCHC: 31.8 g/dL (ref 31.5–35.7)
MCV: 84 fL (ref 79–97)
MONOS ABS: 0.4 10*3/uL (ref 0.1–0.9)
Monocytes: 6 %
NEUTROS PCT: 54 %
Neutrophils Absolute: 4.3 10*3/uL (ref 1.4–7.0)
PLATELETS: 345 10*3/uL (ref 150–450)
RBC: 4.51 x10E6/uL (ref 3.77–5.28)
RDW: 15.2 % (ref 12.3–15.4)
WBC: 7.9 10*3/uL (ref 3.4–10.8)

## 2018-05-16 LAB — DRUG PROFILE, UR, 9 DRUGS (LABCORP)
AMPHETAMINES, URINE: NEGATIVE ng/mL
Barbiturate Quant, Ur: NEGATIVE ng/mL
Benzodiazepine Quant, Ur: NEGATIVE ng/mL
CANNABINOID QUANT UR: NEGATIVE ng/mL
Cocaine (Metab.): NEGATIVE ng/mL
Methadone Screen, Urine: NEGATIVE ng/mL
Opiate Quant, Ur: NEGATIVE ng/mL
PCP Quant, Ur: NEGATIVE ng/mL
Propoxyphene: NEGATIVE ng/mL

## 2018-05-16 LAB — TSH: TSH: 1.29 u[IU]/mL (ref 0.450–4.500)

## 2018-05-16 LAB — T4, FREE: Free T4: 0.81 ng/dL — ABNORMAL LOW (ref 0.82–1.77)

## 2018-05-26 ENCOUNTER — Encounter: Payer: Self-pay | Admitting: Neurology

## 2018-05-26 ENCOUNTER — Ambulatory Visit: Payer: Medicare Other | Admitting: Neurology

## 2018-05-26 VITALS — BP 127/81 | HR 82 | Ht 60.5 in | Wt 200.5 lb

## 2018-05-26 DIAGNOSIS — R52 Pain, unspecified: Secondary | ICD-10-CM | POA: Diagnosis not present

## 2018-05-26 DIAGNOSIS — R404 Transient alteration of awareness: Secondary | ICD-10-CM | POA: Diagnosis not present

## 2018-05-26 DIAGNOSIS — M797 Fibromyalgia: Secondary | ICD-10-CM | POA: Insufficient documentation

## 2018-05-26 DIAGNOSIS — G8929 Other chronic pain: Secondary | ICD-10-CM | POA: Insufficient documentation

## 2018-05-26 HISTORY — DX: Transient alteration of awareness: R40.4

## 2018-05-26 MED ORDER — MELOXICAM 15 MG PO TABS: 15.0000 mg | ORAL_TABLET | ORAL | 11 refills | Status: DC | PRN

## 2018-05-26 MED ORDER — PREGABALIN 50 MG PO CAPS: 50.0000 mg | ORAL_CAPSULE | Freq: Three times a day (TID) | ORAL | 11 refills | Status: DC

## 2018-05-26 NOTE — Progress Notes (Signed)
PATIENT: Tiffany Juarez DOB: May 18, 1981  Chief Complaint  Patient presents with  . Paresthesia/Diffuse Body Pain    No change in symptoms.  She stopped gabapentin on her own. States her psychiatist took her off duloxetine. She did not feel either was helpful for her pain.  She decided to stop going to pain managment due to high copays and frequent required visits.      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  She has history of bipolar disorder, she was treated with different medications since she was 37 years old, currently on polypharmacy treatment, BuSpar 15 mg daily, lamotrigine 50 mg, Latuda 40 mg daily since August 2018,  also gabapentin 900 mg 3 times a day, which only took the edge off her pain.  She has constellation of complaints,  Significant bilateral hip pain since 2014, no clear triggers, now constant 5 out of 10 without movement, much worsening with any weightbearing such as walking, bending over, standing,  She also complains of mild midline low back pain, no radiating pain, concurrent with her hip pain, she also noticed bilateral hand and feet swelling, numbness tingling, burning pain, bilateral ankle and wrist swelling, pain, she denies bowel and bladder incontinence,  She has history of chronic headaches getting worse since August 2018, holoacranial pressure headache, denies significant nausea, mild light sensitivity, lasting for hours to days, she is not taking any medications for it.  She also complains of mild low cervical pain, radiating pain to bilateral shoulder,  Laboratory evaluation in November 2018 showed normal A1c, 5.9, CMP,  UPDATE May 26 2018: Patient continue complains of diffuse body achy pain, previously has tried gabapentin, Cymbalta 60 mg, only provide temporary help, she was also seen by pain management, could not  afford it anymore  In addition, she complains of episode of sudden loss of consciousness since October 2019, had total of 5 spells, she describes one spells she was pulling coffee creamer into the cup, when she came around, the coffee creamer spilled all over-the-counter, she has no recollection of what happened, another example, she was ready to pick up her son, then came around, was late.  There is elapse of time.  X-ray of bilateral hip was normal REVIEW OF SYSTEMS: Full 14 system review of systems performed and notable only for dizziness, passing out, agitation, decreased concentration, depression, anxiety, back pain, frequent awakening, snoring, sleep talking, walking, and abnormal dreams, activity change, appetite change, unexpected weight change  Rest review of system were negative  ALLERGIES: Allergies  Allergen Reactions  . Cephalosporins Anaphylaxis  . Penicillins Anaphylaxis    HOME MEDICATIONS: Current Outpatient Medications  Medication Sig Dispense Refill  . acetaminophen (TYLENOL) 500 MG tablet Take 2 tablets (1,000 mg total) by mouth every 8 (eight) hours as needed for mild pain or moderate pain. 60 tablet 0  . busPIRone (BUSPAR) 15 MG tablet Take 15 mg by mouth daily.  4  . carbamazepine (TEGRETOL) 200 MG tablet Take 200 mg by mouth 2 (two) times daily.  0  . gabapentin (NEURONTIN) 300 MG capsule TAKE 3 CAPSULES BY MOUTH THREE TIMES A DAY 270 capsule 11  . lamoTRIgine (LAMICTAL) 200 MG tablet Take 200 mg by mouth daily.    . Lurasidone HCl (LATUDA) 60 MG TABS Take by mouth.    . metFORMIN (GLUCOPHAGE) 500 MG tablet Take 500 mg  by mouth daily with breakfast.     No current facility-administered medications for this visit.     PAST MEDICAL HISTORY: Past Medical History:  Diagnosis Date  . Anginal pain ([place])   . Bipolar disorder ([place])   . Depression   . Fibroid   . Numbness and tingling     PAST SURGICAL HISTORY: Past Surgical History:  Procedure Laterality Date   . CESAREAN SECTION      FAMILY HISTORY: Family History  Problem Relation Age of Onset  . Depression Father   . Schizophrenia Father   . Depression Brother   . Healthy Mother   . Hypertension Maternal Grandmother   . Diabetes Maternal Aunt     SOCIAL HISTORY:  Social History   Socioeconomic History  . Marital status: Single    Spouse name: Not on file  . Number of children: 3  . Years of education: 32  . Highest education level: High school graduate  Occupational History  . Occupation: Unemployed  Social Needs  . Financial resource strain: Not on file  . Food insecurity:    Worry: Not on file    Inability: Not on file  . Transportation needs:    Medical: Not on file    Non-medical: Not on file  Tobacco Use  . Smoking status: Current Every Day Smoker    Packs/day: 0.50    Types: Cigarettes  . Smokeless tobacco: Never Used  Substance and Sexual Activity  . Alcohol use: No    Comment: quit 2014  . Drug use: No  . Sexual activity: Yes    Birth control/protection: None  Lifestyle  . Physical activity:    Days per week: Not on file    Minutes per session: Not on file  . Stress: Not on file  Relationships  . Social connections:    Talks on phone: Not on file    Gets together: Not on file    Attends religious service: Not on file    Active member of club or organization: Not on file    Attends meetings of clubs or organizations: Not on file    Relationship status: Not on file  . Intimate partner violence:    Fear of current or ex partner: Not on file    Emotionally abused: Not on file    Physically abused: Not on file    Forced sexual activity: Not on file  Other Topics Concern  . Not on file  Social History Narrative   Lives at home with family.   Right-handed.   2 cups caffeine per day.     PHYSICAL EXAM   Vitals:   05/26/18 1040  BP: 127/81  Pulse: 82  Weight: 200 lb 8 oz (90.9 kg)  Height: 5' 0.5" (1.537 m)    Not recorded      Body  mass index is 38.51 kg/m.  PHYSICAL EXAMNIATION:  Gen: NAD, conversant, well nourised, obese, well groomed                     Cardiovascular: Regular rate rhythm, no peripheral edema, warm, nontender. Eyes: Conjunctivae clear without exudates or hemorrhage Neck: Supple, no carotid bruits. Pulmonary: Clear to auscultation bilaterally   NEUROLOGICAL EXAM:  MENTAL STATUS: Speech:    Speech is normal; fluent and spontaneous with normal comprehension.  Cognition:     Orientation to time, place and person     Normal recent and remote memory     Normal Attention span  and concentration     Normal Language, naming, repeating,spontaneous speech     Fund of knowledge   CRANIAL NERVES: CN II: Visual fields are full to confrontation. Fundoscopic exam is normal with sharp discs and no vascular changes. Pupils are round equal and briskly reactive to light. CN III, IV, VI: extraocular movement are normal. No ptosis. CN V: Facial sensation is intact to pinprick in all 3 divisions bilaterally. Corneal responses are intact.  CN VII: Face is symmetric with normal eye closure and smile. CN VIII: Hearing is normal to rubbing fingers CN IX, X: Palate elevates symmetrically. Phonation is normal. CN XI: Head turning and shoulder shrug are intact CN XII: Tongue is midline with normal movements and no atrophy.  MOTOR: There is no pronator drift of out-stretched arms. Muscle bulk and tone are normal. Muscle strength is normal.  REFLEXES: Reflexes are 2+ and symmetric at the biceps, triceps, knees, and ankles. Plantar responses are flexor.  SENSORY: Intact to light touch, pinprick, positional sensation and vibratory sensation are intact in fingers and toes.  COORDINATION: Rapid alternating movements and fine finger movements are intact. There is no dysmetria on finger-to-nose and heel-knee-shin.    GAIT/STANCE: Antalgic, variable effort Romberg is absent.   DIAGNOSTIC DATA (LABS, IMAGING,  TESTING) - I reviewed patient records, labs, notes, testing and imaging myself where available.   ASSESSMENT AND PLAN  Tiffany Juarez is a 37 y.o. female    Whole body achy pain  Essentially normal neurological examination,  I have suggested Mobic as needed, moderate exercise,  Lyrica 50 mg 3 times a day   Confusion spells  Proceed with MRI of brain  EEG  Document all events,  Possibility including medication side effect, mood disorder,  Marcial Pacas, M.D. Ph.D.  [facility] [address], [place], [place] [address] Ph: [phone] Fax: [phone]  CC: Clent Demark, PA-C

## 2018-05-27 ENCOUNTER — Telehealth: Payer: Self-pay | Admitting: Neurology

## 2018-05-27 NOTE — Telephone Encounter (Addendum)
Please call patient, I have ordered MRI of the brain to evaluate her recent onset of sudden onset of confusion, elapse of  Time,

## 2018-05-27 NOTE — Telephone Encounter (Signed)
Per Dr. Krista Blue, okay to wait to call patient until all test have been resulted.

## 2018-05-27 NOTE — Telephone Encounter (Signed)
[employer] medicare Josem Kaufmann: [id number] (exp. 05/27/18 to 06/25/18) order sent to GI pt is aware of this. She also has their number of [phone] and to give them a call if she has not heard in the next 2-3 business days.

## 2018-05-28 DIAGNOSIS — Z79899 Other long term (current) drug therapy: Secondary | ICD-10-CM | POA: Diagnosis not present

## 2018-05-28 LAB — DRUG SCREEN 10 W/CONF, SERUM
Amphetamines, IA: NEGATIVE ng/mL
Barbiturates, IA: NEGATIVE ug/mL
Benzodiazepines, IA: NEGATIVE ng/mL
COCAINE & METABOLITE, IA: NEGATIVE ng/mL
METHADONE, IA: NEGATIVE ng/mL
OXYCODONES, IA: NEGATIVE ng/mL
Opiates, IA: NEGATIVE ng/mL
PROPOXYPHENE, IA: NEGATIVE ng/mL
Phencyclidine, IA: NEGATIVE ng/mL
THC(Marijuana) Metabolite, IA: NEGATIVE ng/mL

## 2018-05-28 LAB — CARBAMAZEPINE LEVEL, TOTAL: CARBAMAZEPINE LVL: 5.5 ug/mL (ref 4.0–12.0)

## 2018-05-28 LAB — LAMOTRIGINE LEVEL: LAMOTRIGINE LVL: 7.9 ug/mL (ref 2.0–20.0)

## 2018-05-29 ENCOUNTER — Telehealth: Payer: Self-pay | Admitting: *Deleted

## 2018-05-29 DIAGNOSIS — F3131 Bipolar disorder, current episode depressed, mild: Secondary | ICD-10-CM | POA: Diagnosis not present

## 2018-05-29 NOTE — Telephone Encounter (Signed)
Left patient a detailed message, with results, on voicemail (ok per DPR).  Provided our number to call back with any questions.  

## 2018-05-29 NOTE — Telephone Encounter (Signed)
-----   Message from Marcial Pacas, MD sent at 05/29/2018  8:05 AM EST ----- Please call patient for normal laboratory result, tegretol level and lamotrigine level were within therapeutic range.

## 2018-06-11 ENCOUNTER — Ambulatory Visit: Payer: Medicare Other | Admitting: Neurology

## 2018-06-11 DIAGNOSIS — R52 Pain, unspecified: Secondary | ICD-10-CM

## 2018-06-11 DIAGNOSIS — R410 Disorientation, unspecified: Secondary | ICD-10-CM | POA: Diagnosis not present

## 2018-06-11 DIAGNOSIS — R404 Transient alteration of awareness: Secondary | ICD-10-CM

## 2018-06-12 DIAGNOSIS — F3131 Bipolar disorder, current episode depressed, mild: Secondary | ICD-10-CM | POA: Diagnosis not present

## 2018-06-16 NOTE — Procedures (Signed)
   HISTORY: 37 year old female, presented with confusion spells  TECHNIQUE:  This is a routine 16 channel EEG recording with one channel devoted to a limited EKG recording.  It was performed during wakefulness, drowsiness and asleep.  Hyperventilation and photic stimulation were performed as activating procedures.  There are minimum muscle and movement artifact noted.  Upon maximum arousal, posterior dominant waking rhythm consistent of rhythmic alpha range activity, with frequency of 8-9 hz. Activities are symmetric over the bilateral posterior derivations and attenuated with eye opening.  Hyperventilation produced mild/moderate buildup with higher amplitude and the slower activities noted.  Photic stimulation did not alter the tracing.  During EEG recording, patient developed drowsiness and no deeper stage of sleep was achieved. During EEG recording, there was no epileptiform discharge noted.  EKG demonstrate normal sinus rhythm.  CONCLUSION: This is a  normal awake EEG.  There is no electrodiagnostic evidence of epileptiform discharge.  Marcial Pacas, M.D. Ph.D.  [facility] [place], [place] [address] Phone: [phone] Fax:      [phone]

## 2018-06-26 DIAGNOSIS — F3131 Bipolar disorder, current episode depressed, mild: Secondary | ICD-10-CM | POA: Diagnosis not present

## 2018-07-11 ENCOUNTER — Ambulatory Visit (INDEPENDENT_AMBULATORY_CARE_PROVIDER_SITE_OTHER): Payer: Medicare Other | Admitting: Obstetrics & Gynecology

## 2018-07-11 ENCOUNTER — Encounter: Payer: Self-pay | Admitting: Obstetrics & Gynecology

## 2018-07-11 VITALS — BP 128/80 | HR 89 | Ht 60.0 in | Wt 197.3 lb

## 2018-07-11 DIAGNOSIS — Z01419 Encounter for gynecological examination (general) (routine) without abnormal findings: Secondary | ICD-10-CM

## 2018-07-11 DIAGNOSIS — N920 Excessive and frequent menstruation with regular cycle: Secondary | ICD-10-CM | POA: Diagnosis not present

## 2018-07-11 DIAGNOSIS — B373 Candidiasis of vulva and vagina: Secondary | ICD-10-CM

## 2018-07-11 DIAGNOSIS — N946 Dysmenorrhea, unspecified: Secondary | ICD-10-CM

## 2018-07-11 DIAGNOSIS — N898 Other specified noninflammatory disorders of vagina: Secondary | ICD-10-CM | POA: Diagnosis not present

## 2018-07-11 DIAGNOSIS — Z113 Encounter for screening for infections with a predominantly sexual mode of transmission: Secondary | ICD-10-CM

## 2018-07-11 MED ORDER — METRONIDAZOLE 500 MG PO TABS: 500.0000 mg | ORAL_TABLET | Freq: Two times a day (BID) | ORAL | 0 refills | Status: DC

## 2018-07-11 NOTE — Progress Notes (Signed)
Subjective:    Tiffany Juarez is a 37 y.o. engaged P3 (69, 53, and 17 yo kids +2 soon to be stepkids) female who presents for an annual exam. She complains of a vaginal odor for about 3 months, has tried [employer], [facility] [employer] (suppository) with some help. She also reports that her periods are getting heavier (hbg 11.1)and she has pelvic pain.The patient is sexually active. GYN screening history: last pap: was normal. The patient wears seatbelts: yes. The patient participates in regular exercise: no. Has the patient ever been transfused or tattooed?: yes. The patient reports that there is not domestic violence in her life now.   Menstrual History: OB History    Gravida  5   Para  3   Term  1   Preterm  2   AB  2   Living  3     SAB  1   TAB  1   Ectopic      Multiple      Live Births  59           Menarche age: 89 Patient's last menstrual period was 06/26/2018 (exact date).    The following portions of the patient's history were reviewed and updated as appropriate: allergies, current medications, past family history, past medical history, past social history, past surgical history and problem list.  Review of Systems Pertinent items are noted in HPI.   On disability for psych issues She has gained 70 pounds in the last year.   Objective:    BP 128/80   Pulse 89   Ht 5' (1.524 m)   Wt 197 lb 4.8 oz (89.5 kg)   LMP 06/26/2018 (Exact Date)   BMI 38.53 kg/m   General Appearance:    Alert, cooperative, no distress, appears stated age  Head:    Normocephalic, without obvious abnormality, atraumatic  Eyes:    PERRL, conjunctiva/corneas clear, EOM's intact, fundi    benign, both eyes  Ears:    Normal TM's and external ear canals, both ears  Nose:   Nares normal, septum midline, mucosa normal, no drainage    or sinus tenderness  Throat:   Lips, mucosa, and tongue normal; teeth and gums normal  Neck:   Supple, symmetrical, trachea midline, no adenopathy;    thyroid:   no enlargement/tenderness/nodules; no carotid   bruit or JVD  Back:     Symmetric, no curvature, ROM normal, no CVA tenderness  Lungs:     Clear to auscultation bilaterally, respirations unlabored  Chest Wall:    No tenderness or deformity   Heart:    Regular rate and rhythm, S1 and S2 normal, no murmur, rub   or gallop  Breast Exam:    No tenderness, masses, or nipple abnormality  Abdomen:     Soft, non-tender, bowel sounds active all four quadrants,    no masses, no organomegaly  Genitalia:    Normal female without lesion, discharge or tenderness, discharge c/w yeast and bv, normal size and shape, anteverted, mobile, non-tender, normal adnexal exam      Extremities:   Extremities normal, atraumatic, no cyanosis or edema  Pulses:   2+ and symmetric all extremities  Skin:   Skin color, texture, turgor normal, no rashes or lesions  Lymph nodes:   Cervical, supraclavicular, and axillary nodes normal  Neurologic:   CNII-XII intact, normal strength, sensation and reflexes    throughout  .    Assessment:    Healthy female exam.  Probable bv and yeast Heavy periods with mild anemia Pelvic pain   Plan:     Wet prep.   Rec healthy lifestyle Treat with flagyl and diflucan Rec boric acid QOhs STI testing Gyn u/s Come back 3 weeks

## 2018-07-12 LAB — LIPID PANEL
CHOLESTEROL TOTAL: 255 mg/dL — AB (ref 100–199)
Chol/HDL Ratio: 4.6 ratio — ABNORMAL HIGH (ref 0.0–4.4)
HDL: 56 mg/dL (ref >39)
LDL Calculated: 170 mg/dL — ABNORMAL HIGH (ref 0–99)
TRIGLYCERIDES: 143 mg/dL (ref 0–149)
VLDL Cholesterol Cal: 29 mg/dL (ref 5–40)

## 2018-07-12 LAB — HEPATITIS C ANTIBODY: Hep C Virus Ab: <0.1 s/co ratio (ref 0.0–0.9)

## 2018-07-12 LAB — HEPATITIS B SURFACE ANTIGEN: HEP B S AG: NEGATIVE

## 2018-07-14 ENCOUNTER — Telehealth: Payer: Self-pay | Admitting: *Deleted

## 2018-07-14 ENCOUNTER — Encounter: Payer: Self-pay | Admitting: *Deleted

## 2018-07-14 ENCOUNTER — Ambulatory Visit (HOSPITAL_COMMUNITY)
Admission: RE | Admit: 2018-07-14 | Discharge: 2018-07-14 | Disposition: A | Discharge status: Home / Self Care | Payer: Medicare Other | Source: Ambulatory Visit | Attending: Obstetrics & Gynecology | Admitting: Obstetrics & Gynecology

## 2018-07-14 DIAGNOSIS — N920 Excessive and frequent menstruation with regular cycle: Secondary | ICD-10-CM | POA: Diagnosis not present

## 2018-07-14 DIAGNOSIS — D252 Subserosal leiomyoma of uterus: Secondary | ICD-10-CM | POA: Diagnosis not present

## 2018-07-14 DIAGNOSIS — N946 Dysmenorrhea, unspecified: Secondary | ICD-10-CM | POA: Insufficient documentation

## 2018-07-14 LAB — CERVICOVAGINAL ANCILLARY ONLY
BACTERIAL VAGINITIS: NEGATIVE
CHLAMYDIA, DNA PROBE: NEGATIVE
Candida vaginitis: POSITIVE — AB
Neisseria Gonorrhea: NEGATIVE
TRICH (WINDOWPATH): NEGATIVE

## 2018-07-14 MED ORDER — FLUCONAZOLE 150 MG PO TABS: 150.0000 mg | ORAL_TABLET | Freq: Once | ORAL | 0 refills | Status: AC

## 2018-07-14 NOTE — Telephone Encounter (Signed)
Pt left message on 12/20 stating that Dr. Hulan Fray had forgotten to prescribe diflucan. Per chart review, note from Dr. Hulan Fray indicates her intention to prescribe diflucan - Rx sent to pharmacy.  Pt notified via [place].

## 2018-07-17 ENCOUNTER — Telehealth: Payer: Self-pay | Admitting: *Deleted

## 2018-07-17 NOTE — Telephone Encounter (Signed)
-----   Message from Emily Filbert, MD sent at 07/17/2018  8:18 AM EST ----- She needs to make an appt with her primary care provider since her lipids are elevated. Thanks

## 2018-07-17 NOTE — Telephone Encounter (Signed)
Called pt to inform her that she has elevated lipids and needs to follow up with PCP.  Pt reports she does have a PCP.  Pt verbalized understanding.

## 2018-07-31 DIAGNOSIS — F3131 Bipolar disorder, current episode depressed, mild: Secondary | ICD-10-CM | POA: Diagnosis not present

## 2018-08-05 DIAGNOSIS — F3131 Bipolar disorder, current episode depressed, mild: Secondary | ICD-10-CM | POA: Diagnosis not present

## 2018-08-27 DIAGNOSIS — Z0271 Encounter for disability determination: Secondary | ICD-10-CM

## 2018-09-05 DIAGNOSIS — F3131 Bipolar disorder, current episode depressed, mild: Secondary | ICD-10-CM | POA: Diagnosis not present

## 2018-09-11 DIAGNOSIS — F3131 Bipolar disorder, current episode depressed, mild: Secondary | ICD-10-CM | POA: Diagnosis not present

## 2018-09-19 DIAGNOSIS — F3131 Bipolar disorder, current episode depressed, mild: Secondary | ICD-10-CM | POA: Diagnosis not present

## 2018-09-22 NOTE — Progress Notes (Signed)
PATIENT: Tiffany Juarez DOB: [birth year]  REASON FOR VISIT: follow up HISTORY FROM: patient  Chief Complaint  Patient presents with  . Follow-up    4 month follow up. Alone. New room. Patient mentioned that she has pain when she walks.      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  She has history of bipolar disorder, she was treated with different medications since she was 38 years old, currently on polypharmacy treatment, BuSpar 15 mg daily, lamotrigine 50 mg, Latuda 40 mg daily since August 2018,  also gabapentin 900 mg 3 times a day, which only took the edge off her pain.  She has constellation of complaints,  Significant bilateral hip pain since 2014, no clear triggers, now constant 5 out of 10 without movement, much worsening with any weightbearing such as walking, bending over, standing,  She also complains of mild midline low back pain, no radiating pain, concurrent with her hip pain, she also noticed bilateral hand and feet swelling, numbness tingling, burning pain, bilateral ankle and wrist swelling, pain, she denies bowel and bladder incontinence,  She has history of chronic headaches getting worse since August 2018, holoacranial pressure headache, denies significant nausea, mild light sensitivity, lasting for hours to days, she is not taking any medications for it.  She also complains of mild low cervical pain, radiating pain to bilateral shoulder,  Laboratory evaluation in November 2018 showed normal A1c, 5.9, CMP,  UPDATE May 26 2018: Patient continue complains of diffuse body achy pain, previously has tried gabapentin, Cymbalta 60 mg, only provide temporary help, she was also seen by pain management, could not afford it anymore  In addition, she complains of episode of  sudden loss of consciousness since October 2019, had total of 5 spells, she describes one spells she was pulling coffee creamer into the cup, when she came around, the coffee creamer spilled all over-the-counter, she has no recollection of what happened, another example, she was ready to pick up her son, then came around, was late.  There is elapse of time.  X-ray of bilateral hip was normal  Update 09/22/18 Tiffany Juarez is a 38 y.o. female here today for follow up alterations of awareness and bilateral hip pain. She is taking lamotrigine 200mg  daily and carbamazepine 200mg  BID prescribed by psychiatrist for mood stabilization. EEG was normal. She has not completed the MRI. She does not feel this is necessary. She is feeling well. She denies any episodes of alteration of awareness. Last event was 05/2018. She is taking Lyrica 50mg  TID and feels that this has significantly helped her hip pain. She is trying to walk at the park when the weather allows.    REVIEW OF SYSTEMS: Out of a complete 14 system review of symptoms, the patient complains only of the following symptoms, daytime sleepiness, sleep talking, urgency, walking difficulty, numbness and all other reviewed systems are negative.  ALLERGIES: Allergies  Allergen Reactions  . Cephalosporins Anaphylaxis  . Penicillins Anaphylaxis    HOME MEDICATIONS: Outpatient Medications Prior to Visit  Medication Sig Dispense Refill  . acetaminophen (TYLENOL) 500 MG tablet Take 2 tablets (1,000 mg total) by mouth every 8 (eight) hours as needed for mild pain or moderate pain. 60 tablet 0  . busPIRone (BUSPAR) 15 MG tablet Take 15 mg by  mouth daily.  4  . carbamazepine (TEGRETOL) 200 MG tablet Take 200 mg by mouth 2 (two) times daily.  0  . lamoTRIgine (LAMICTAL) 200 MG tablet Take 200 mg by mouth daily.    . Lurasidone HCl (LATUDA) 60 MG TABS Take 60 mg by mouth daily.     . meloxicam (MOBIC) 15 MG tablet Take 1 tablet (15 mg total) by mouth as  needed for pain. 30 tablet 11  . metFORMIN (GLUCOPHAGE) 500 MG tablet Take 500 mg by mouth daily with breakfast.    . pregabalin (LYRICA) 50 MG capsule Take 1 capsule (50 mg total) by mouth 3 (three) times daily. 90 capsule 11  . clonazePAM (KLONOPIN) 0.5 MG tablet Take 0.5 mg by mouth 2 (two) times daily. 1 as needed daily    . metroNIDAZOLE (FLAGYL) 500 MG tablet Take 1 tablet (500 mg total) by mouth 2 (two) times daily. 14 tablet 0   No facility-administered medications prior to visit.     PAST MEDICAL HISTORY: Past Medical History:  Diagnosis Date  . Anginal pain ([place])   . Bipolar disorder ([place])   . Depression   . Fibroid   . Fibromyalgia   . Numbness and tingling     PAST SURGICAL HISTORY: Past Surgical History:  Procedure Laterality Date  . CESAREAN SECTION    . wisdom teeth      FAMILY HISTORY: Family History  Problem Relation Age of Onset  . Depression Father   . Schizophrenia Father   . Depression Brother   . Healthy Mother   . Hypertension Maternal Grandmother   . Diabetes Maternal Aunt     SOCIAL HISTORY: Social History   Socioeconomic History  . Marital status: Single    Spouse name: Not on file  . Number of children: 3  . Years of education: 52  . Highest education level: High school graduate  Occupational History  . Occupation: Unemployed  Social Needs  . Financial resource strain: Not on file  . Food insecurity:    Worry: Not on file    Inability: Not on file  . Transportation needs:    Medical: Not on file    Non-medical: Not on file  Tobacco Use  . Smoking status: Current Every Day Smoker    Packs/day: 0.50    Types: Cigarettes  . Smokeless tobacco: Never Used  Substance and Sexual Activity  . Alcohol use: No    Comment: quit 2014  . Drug use: No  . Sexual activity: Yes    Birth control/protection: None  Lifestyle  . Physical activity:    Days per week: Not on file    Minutes per session: Not on file  . Stress: Not on file    Relationships  . Social connections:    Talks on phone: Not on file    Gets together: Not on file    Attends religious service: Not on file    Active member of club or organization: Not on file    Attends meetings of clubs or organizations: Not on file    Relationship status: Not on file  . Intimate partner violence:    Fear of current or ex partner: Not on file    Emotionally abused: Not on file    Physically abused: Not on file    Forced sexual activity: Not on file  Other Topics Concern  . Not on file  Social History Narrative   Lives at home with family.   Right-handed.  2 cups caffeine per day.      PHYSICAL EXAM  Vitals:   09/24/18 1049  BP: 113/80  Pulse: 92  Weight: 199 lb (90.3 kg)  Height: 5' (1.524 m)   Body mass index is 38.86 kg/m.  Generalized: Well developed, in no acute distress  Cardiology: normal rate and rhythm, no murmur noted Neurological examination  Mentation: Alert oriented to time, place, history taking. Follows all commands speech and language fluent Cranial nerve II-XII: Pupils were equal round reactive to light. Extraocular movements were full, visual field were full on confrontational test. Facial sensation and strength were normal. Uvula tongue midline. Head turning and shoulder shrug  were normal and symmetric. Motor: The motor testing reveals 5 over 5 strength of all 4 extremities. Good symmetric motor tone is noted throughout.  Sensory: Sensory testing is intact to soft touch on all 4 extremities. No evidence of extinction is noted.  Coordination: Cerebellar testing reveals good finger-nose-finger bilaterally.  Gait and station: Gait is normal.  Reflexes: Deep tendon reflexes are symmetric and normal bilaterally.   DIAGNOSTIC DATA (LABS, IMAGING, TESTING) - I reviewed patient records, labs, notes, testing and imaging myself where available.  No flowsheet data found.   Lab Results  Component Value Date   WBC 7.9 05/15/2018   HGB  12.1 05/15/2018   HCT 38.0 05/15/2018   MCV 84 05/15/2018   PLT 345 05/15/2018      Component Value Date/Time   NA 141 05/15/2018 1143   K 4.5 05/15/2018 1143   CL 103 05/15/2018 1143   CO2 24 05/15/2018 1143   GLUCOSE 106 (H) 05/15/2018 1143   BUN 9 05/15/2018 1143   CREATININE 1.02 (H) 05/15/2018 1143   CALCIUM 9.6 05/15/2018 1143   PROT 6.9 05/15/2018 1143   ALBUMIN 4.5 05/15/2018 1143   AST 15 05/15/2018 1143   ALT 19 05/15/2018 1143   ALKPHOS 129 (H) 05/15/2018 1143   BILITOT <0.2 05/15/2018 1143   GFRNONAA 71 05/15/2018 1143   GFRAA 82 05/15/2018 1143   Lab Results  Component Value Date   CHOL 255 (H) 07/11/2018   HDL 56 07/11/2018   LDLCALC 170 (H) 07/11/2018   TRIG 143 07/11/2018   CHOLHDL 4.6 (H) 07/11/2018   Lab Results  Component Value Date   HGBA1C 6.1 (H) 03/18/2018   Lab Results  Component Value Date   VITAMINB12 610 10/09/2017   Lab Results  Component Value Date   TSH 1.290 05/15/2018       ASSESSMENT AND PLAN 38 y.o. year old female  has a past medical history of Anginal pain ([place]), Bipolar disorder ([place]), Depression, Fibroid, Fibromyalgia, and Numbness and tingling. here with     ICD-10-CM   1. Alteration consciousness R40.4   2. Pain of both hip joints M25.551    M25.552     Tiffany Juarez reports that she is doing well overall.  She is tolerating all medications without any apparent side effects.  She continues close follow-up with her psychiatrist.  She has had no episodes of alteration of awareness.  She does not feel an MRI will give her any more information at this time.  She wishes to hold off for now.  EEG was normal.  Lyrica 50 mg 3 times a day does seem to be helping significantly with her hip pain.  She continues to have some numbness but feels that this is improved as well.  She is trying to walk but reports the weather has not cooperated  with her.  I have encouraged her to continue regular exercise.  I would like for her to walk for  about 30 minutes 3-4 times a week.  We will continue Lyrica as prescribed.  I have advised her that Lyrica is a controlled substance and we will need to see her back every 6 months.  We have also discussed potential of PCP resuming Lyrica prescriptions should cost of follow-up become a concern.  She verbalizes understanding.   No orders of the defined types were placed in this encounter.    No orders of the defined types were placed in this encounter.     I spent 15 minutes with the patient. 50% of this time was spent counseling and educating patient on plan of care and medications.    Debbora Presto, FNP-C 09/24/2018, 11:28 AM [facility] [address] [place], [place] [address] [phone]

## 2018-09-24 ENCOUNTER — Ambulatory Visit: Payer: Medicare Other | Admitting: Nurse Practitioner

## 2018-09-24 ENCOUNTER — Ambulatory Visit (INDEPENDENT_AMBULATORY_CARE_PROVIDER_SITE_OTHER): Payer: Medicare Other | Admitting: Family Medicine

## 2018-09-24 ENCOUNTER — Encounter: Payer: Self-pay | Admitting: Family Medicine

## 2018-09-24 VITALS — BP 113/80 | HR 92 | Ht 60.0 in | Wt 199.0 lb

## 2018-09-24 DIAGNOSIS — M25552 Pain in left hip: Secondary | ICD-10-CM | POA: Diagnosis not present

## 2018-09-24 DIAGNOSIS — M25551 Pain in right hip: Secondary | ICD-10-CM

## 2018-09-24 DIAGNOSIS — R404 Transient alteration of awareness: Secondary | ICD-10-CM

## 2018-09-24 NOTE — Patient Instructions (Signed)
Continue Lyrica 50mg  three times a day  Consider PT in future if pain worsens.   Hip Pain  The hip is the joint between the upper legs and the lower pelvis. The bones, cartilage, tendons, and muscles of your hip joint support your body and allow you to move around. Hip pain can range from a minor ache to severe pain in one or both of your hips. The pain may be felt on the inside of the hip joint near the groin, or the outside near the buttocks and upper thigh. You may also have swelling or stiffness. Follow these instructions at home: Managing pain, stiffness, and swelling  If directed, apply ice to the injured area. ? Put ice in a plastic bag. ? Place a towel between your skin and the bag. ? Leave the ice on for 20 minutes, 2-3 times a day  Sleep with a pillow between your legs on your most comfortable side.  Avoid any activities that cause pain. General instructions  Take over-the-counter and prescription medicines only as told by your health care provider.  Do any exercises as told by your health care provider.  Record the following: ? How often you have hip pain. ? The location of your pain. ? What the pain feels like. ? What makes the pain worse.  Keep all follow-up visits as told by your health care provider. This is important. Contact a health care provider if:  You cannot put weight on your leg.  Your pain or swelling continues or gets worse after one week.  It gets harder to walk.  You have a fever. Get help right away if:  You fall.  You have a sudden increase in pain and swelling in your hip.  Your hip is red or swollen or very tender to touch. Summary  Hip pain can range from a minor ache to severe pain in one or both of your hips.  The pain may be felt on the inside of the hip joint near the groin, or the outside near the buttocks and upper thigh.  Avoid any activities that cause pain.  Record how often you have hip pain, the location of the pain,  what makes it worse and what it feels like. This information is not intended to replace advice given to you by your health care provider. Make sure you discuss any questions you have with your health care provider. Document Released: 12/27/2009 Document Revised: 06/11/2016 Document Reviewed: 06/11/2016 Elsevier Interactive Patient Education  2019 [employer].

## 2018-09-25 DIAGNOSIS — F3131 Bipolar disorder, current episode depressed, mild: Secondary | ICD-10-CM | POA: Diagnosis not present

## 2018-10-07 NOTE — Progress Notes (Signed)
I have reviewed and agreed above plan. 

## 2018-10-17 DIAGNOSIS — F3131 Bipolar disorder, current episode depressed, mild: Secondary | ICD-10-CM | POA: Diagnosis not present

## 2018-11-06 ENCOUNTER — Encounter: Payer: Self-pay | Admitting: Family Medicine

## 2018-11-06 ENCOUNTER — Other Ambulatory Visit: Payer: Self-pay

## 2018-11-06 ENCOUNTER — Ambulatory Visit (INDEPENDENT_AMBULATORY_CARE_PROVIDER_SITE_OTHER): Payer: Medicare Other | Admitting: Family Medicine

## 2018-11-06 VITALS — Wt 196.0 lb

## 2018-11-06 DIAGNOSIS — N926 Irregular menstruation, unspecified: Secondary | ICD-10-CM | POA: Diagnosis not present

## 2018-11-06 DIAGNOSIS — R7303 Prediabetes: Secondary | ICD-10-CM

## 2018-11-06 DIAGNOSIS — R202 Paresthesia of skin: Secondary | ICD-10-CM | POA: Diagnosis not present

## 2018-11-06 DIAGNOSIS — R252 Cramp and spasm: Secondary | ICD-10-CM

## 2018-11-06 HISTORY — DX: Prediabetes: R73.03

## 2018-11-06 NOTE — Progress Notes (Signed)
   Subjective:    Patient ID: Tiffany Juarez, female    DOB: Apr 26, 1981, 38 y.o.   MRN: [id number]  Interactive audio and video telecommunications were attempted between this provider and patient, however due to patient not having access to video capability, we continued and completed visit with audio only.  The patient was located at home. 2 patient identifiers used.  The provider was located in the office. The patient did consent to this visit and is aware of possible charges through their insurance for this visit.  The other persons participating in this telemedicine service were none.   HPI Chief Complaint  Patient presents with  . Leg Cramping    leg cramping- been going on for 2 weeks. both legs cramping at claf area and moves up thigh. numbness and tingling in feet.    Complains of a 4 week history of intermittent bilateral lower extremity cramping. States cramping occurs several days each week and some days she does not have cramps at all. Cramping located in bilateral calves, behind her knees and into her bilateral thighs.  Activity makes it worse. Keeps her awake at night some nights.  Drinking more water than usual. 5-6 12 ounce bottles of water.   She also reports numbness and tingling of her lower extremities. This is not new.  Denies weakness.   Denies fever, chills, dizziness, chest pain, palpitations, shortness of breath, abdominal pain, N/V/D, LE edema.    Chronic pain and paresthesias - has seen neurology for this. She was under the care of [facility] but stopped seeing them.   Under the care of Dr. Casimiro Needle for bipolar disorder.   LMP: 10/10/18 Contraception: none Is sexually active and states there is a chance of pregnancy.  States her period was late last month. States she is having lower abdominal cramping but no period yet.    Review of Systems Pertinent positives and negatives in the history of present illness.     Objective:   Physical  Exam Wt 196 lb (88.9 kg)   LMP 10/10/2018   BMI 38.28 kg/m   Alert and oriented and in no acute distress. Speaking in complete sentences without difficulty. Normal thought process and mood.       Assessment & Plan:  Bilateral leg cramps - Plan: CBC with Differential/Platelet, Comprehensive metabolic panel  Paresthesia - Plan: CBC with Differential/Platelet, Comprehensive metabolic panel, Vitamin V49, TSH  Late menses - Plan: POCT urine pregnancy  Prediabetes - Plan: Hemoglobin A1c, TSH  She is on several psychotropic medications and neuropathic pain medication as well. Under the care of psychiatrist and neurologist. No longer seeing pain management.  Will have her come in tomorrow for lab visit and have her take warm baths and do stretching several times per day. No new medication advised today.  Recheck Hgb A1c due to prediabetes.  Check POCT urine pregnancy tomorrow as well since she thinks this is a possibility.   This virtual service is not related to other E/M service within previous 7 days.

## 2018-11-07 ENCOUNTER — Other Ambulatory Visit (INDEPENDENT_AMBULATORY_CARE_PROVIDER_SITE_OTHER): Payer: Medicare Other

## 2018-11-07 DIAGNOSIS — N926 Irregular menstruation, unspecified: Secondary | ICD-10-CM

## 2018-11-07 DIAGNOSIS — R7303 Prediabetes: Secondary | ICD-10-CM | POA: Diagnosis not present

## 2018-11-07 DIAGNOSIS — R202 Paresthesia of skin: Secondary | ICD-10-CM | POA: Diagnosis not present

## 2018-11-07 DIAGNOSIS — R252 Cramp and spasm: Secondary | ICD-10-CM

## 2018-11-07 LAB — POCT URINE PREGNANCY: Preg Test, Ur: NEGATIVE

## 2018-11-08 LAB — CBC WITH DIFFERENTIAL/PLATELET
Basophils Absolute: 0 10*3/uL (ref 0.0–0.2)
Basos: 0 %
EOS (ABSOLUTE): 0.2 10*3/uL (ref 0.0–0.4)
Eos: 3 %
Hematocrit: 39.1 % (ref 34.0–46.6)
Hemoglobin: 12.5 g/dL (ref 11.1–15.9)
Immature Grans (Abs): 0 10*3/uL (ref 0.0–0.1)
Immature Granulocytes: 0 %
Lymphocytes Absolute: 2.7 10*3/uL (ref 0.7–3.1)
Lymphs: 36 %
MCH: 27.5 pg (ref 26.6–33.0)
MCHC: 32 g/dL (ref 31.5–35.7)
MCV: 86 fL (ref 79–97)
Monocytes Absolute: 0.5 10*3/uL (ref 0.1–0.9)
Monocytes: 7 %
Neutrophils Absolute: 4.1 10*3/uL (ref 1.4–7.0)
Neutrophils: 54 %
Platelets: 273 10*3/uL (ref 150–450)
RBC: 4.54 x10E6/uL (ref 3.77–5.28)
RDW: 15.2 % (ref 11.7–15.4)
WBC: 7.5 10*3/uL (ref 3.4–10.8)

## 2018-11-08 LAB — COMPREHENSIVE METABOLIC PANEL
ALT: 12 IU/L (ref 0–32)
AST: 13 IU/L (ref 0–40)
Albumin/Globulin Ratio: 2.3 — ABNORMAL HIGH (ref 1.2–2.2)
Albumin: 3.5 g/dL — ABNORMAL LOW (ref 3.8–4.8)
Alkaline Phosphatase: 87 IU/L (ref 39–117)
BUN/Creatinine Ratio: 8 — ABNORMAL LOW (ref 9–23)
BUN: 8 mg/dL (ref 6–20)
Bilirubin Total: <0.2 mg/dL (ref 0.0–1.2)
CO2: 21 mmol/L (ref 20–29)
Calcium: 8.5 mg/dL — ABNORMAL LOW (ref 8.7–10.2)
Chloride: 106 mmol/L (ref 96–106)
Creatinine, Ser: 1.05 mg/dL — ABNORMAL HIGH (ref 0.57–1.00)
GFR calc Af Amer: 79 mL/min/{1.73_m2} (ref >59)
GFR calc non Af Amer: 68 mL/min/{1.73_m2} (ref >59)
Globulin, Total: 1.5 g/dL (ref 1.5–4.5)
Glucose: 96 mg/dL (ref 65–99)
Potassium: 4.5 mmol/L (ref 3.5–5.2)
Sodium: 141 mmol/L (ref 134–144)
Total Protein: 5 g/dL — ABNORMAL LOW (ref 6.0–8.5)

## 2018-11-08 LAB — TSH: TSH: 2.59 u[IU]/mL (ref 0.450–4.500)

## 2018-11-08 LAB — HEMOGLOBIN A1C
Est. average glucose Bld gHb Est-mCnc: 117 mg/dL
Hgb A1c MFr Bld: 5.7 % — ABNORMAL HIGH (ref 4.8–5.6)

## 2018-11-08 LAB — VITAMIN B12: Vitamin B-12: 368 pg/mL (ref 232–1245)

## 2018-11-10 ENCOUNTER — Other Ambulatory Visit: Payer: Self-pay

## 2018-11-10 ENCOUNTER — Ambulatory Visit (INDEPENDENT_AMBULATORY_CARE_PROVIDER_SITE_OTHER): Payer: Medicare Other | Admitting: Family Medicine

## 2018-11-10 ENCOUNTER — Encounter: Payer: Self-pay | Admitting: Family Medicine

## 2018-11-10 VITALS — BP 120/78 | HR 100 | Temp 98.7°F | Resp 16 | Wt 196.0 lb

## 2018-11-10 DIAGNOSIS — R7989 Other specified abnormal findings of blood chemistry: Secondary | ICD-10-CM | POA: Diagnosis not present

## 2018-11-10 DIAGNOSIS — E8809 Other disorders of plasma-protein metabolism, not elsewhere classified: Secondary | ICD-10-CM | POA: Diagnosis not present

## 2018-11-10 DIAGNOSIS — R7303 Prediabetes: Secondary | ICD-10-CM | POA: Diagnosis not present

## 2018-11-10 DIAGNOSIS — R252 Cramp and spasm: Secondary | ICD-10-CM | POA: Diagnosis not present

## 2018-11-10 DIAGNOSIS — R202 Paresthesia of skin: Secondary | ICD-10-CM

## 2018-11-10 LAB — POCT URINALYSIS DIPSTICK
Appearance: NEGATIVE
Bilirubin, UA: NEGATIVE
Blood, UA: NEGATIVE
Glucose, UA: NEGATIVE
Ketones, UA: NEGATIVE
Leukocytes, UA: NEGATIVE
Nitrite, UA: NEGATIVE
Odor: NEGATIVE
Protein, UA: NEGATIVE
Spec Grav, UA: 1.015 (ref 1.010–1.025)
Urobilinogen, UA: NEGATIVE E.U./dL — AB
pH, UA: 6 (ref 5.0–8.0)

## 2018-11-10 NOTE — Patient Instructions (Addendum)
Increase your protein and healthy calorie intake.  Take a vitamin B12 supplement or a ONE a Day vitamin daily.  Let's see how your blood work looks from today and follow up in 4 weeks.   Call and schedule with Dr. Casimiro Needle to discuss your medications and possible side effects related to these including the intermittent leg cramping and low protein.     Protein Content in Foods  Generally, most healthy people need around 50 grams of protein each day. Depending on your overall health, you may need more or less protein in your diet. Talk to your health care provider or dietitian about how much protein you need. See the following list for the protein content of some common foods. High-protein foods High-protein foods contain 4 grams (4 g) or more of protein per serving. They include:  Beef, ground sirloin (cooked) - 3 oz have 24 g of protein.  Cheese (hard) - 1 oz has 7 g of protein.  Chicken breast, boneless and skinless (cooked) - 3 oz have 13.4 g of protein.  Cottage cheese - 1/2 cup has 13.4 g of protein.  Egg - 1 egg has 6 g of protein.  Fish, filet (cooked) - 1 oz has 6-7 g of protein.  Garbanzo beans (canned or cooked) - 1/2 cup has 6-7 g of protein.  Kidney beans (canned or cooked) - 1/2 cup has 6-7 g of protein.  Lamb (cooked) - 3 oz has 24 g of protein.  Milk - 1 cup (8 oz) has 8 g of protein.  Nuts (peanuts, pistachios, almonds) - 1 oz has 6 g of protein.  Peanut butter - 1 oz has 7-8 g of protein.  Pork tenderloin (cooked) - 3 oz has 18.4 g of protein.  Pumpkin seeds - 1 oz has 8.5 g of protein.  Soybeans (roasted) - 1 oz has 8 g of protein.  Soybeans (cooked) - 1/2 cup has 11 g of protein.  Soy milk - 1 cup (8 oz) has 5-10 g of protein.  Soy or vegetable patty - 1 patty has 11 g of protein.  Sunflower seeds - 1 oz has 5.5 g of protein.  Tofu (firm) - 1/2 cup has 20 g of protein.  Tuna (canned in water) - 3 oz has 20 g of protein.  Yogurt - 6 oz has 8 g  of protein. Low-protein foods Low-protein foods contain 3 grams (3 g) or less of protein per serving. They include:  Beets (raw or cooked) - 1/2 cup has 1.5 g of protein.  Bran cereal - 1/2 cup has 2-3 g of protein.  Bread - 1 slice has 2.5 g of protein.  Broccoli (raw or cooked) - 1/2 cup has 2 g of protein.  Collard greens (raw or cooked) - 1/2 cup has 2 g of protein.  Corn (fresh or cooked) - 1/2 cup has 2 g of protein.  Cream cheese - 1 oz has 2 g of protein.  Creamer (half-and-half) - 1 oz has 1 g of protein.  Flour tortilla - 1 tortilla has 2.5 g of protein  Frozen yogurt - 1/2 cup has 3 g of protein.  Fruit or vegetable juice - 1/2 cup has 1 g of protein.  Green beans (raw or cooked) - 1/2 cup has 1 g of protein.  Green peas (canned) - 1/2 cup has 3.5 g of protein.  Muffins - 1 small muffin (2 oz) has 3 g of protein.  Oatmeal (cooked) - 1/2 cup has  3 g of protein.  Potato (baked with skin) - 1 medium potato has 3 g of protein.  Rice (cooked) - 1/2 cup has 2.5-3.5 g of protein.  Sour cream - 1/2 cup has 2.5 g of protein.  Spinach (cooked) - 1/2 cup has 3 g of protein.  Squash (cooked) - 1/2 cup has 1.5 g of protein. Actual amounts of protein may be different depending on processing. Talk with your health care provider or dietitian about what foods are recommended for you. This information is not intended to replace advice given to you by your health care provider. Make sure you discuss any questions you have with your health care provider. Document Released: 10/08/2015 Document Revised: 03/19/2016 Document Reviewed: 03/19/2016 Elsevier Interactive Patient Education  2019 [employer].

## 2018-11-10 NOTE — Progress Notes (Signed)
   Subjective:    Patient ID: Tiffany Juarez, female    DOB: Nov 23, 1980, 38 y.o.   MRN: [id number]  HPI Chief Complaint  Patient presents with  . discuss labs    discuss labs   In the office today to discuss abnormal labs.  We discussed intermittent leg cramping last week.  Denies having this today.   Reports eating only one meal per day. This is not due to financial reasons per patient. States this is not new.  Yesterday she ate a stuffed pepper with ground beef.  Does not eat breakfast or lunch.  Denies alcohol use.  Reports drinking 6 glasses of water daily.   States she could be pregnant. Periods have been abnormal the past 2 months.   Has 2 biological children and 2 step kids.   Her psychiatrist is Dr. Casimiro Needle. She is on several psychotropic medications. No changes in at least 6 months per patient. Is overdue for appointment.   Denies fever, chills, dizziness, headache, chest pain, palpitations, shortness of breath, abdominal pain, N/V/D, urinary symptoms, LE edema.   Denies any recent weight changes or changes in bowel habits.   Reviewed allergies, medications, past medical, surgical, family, and social history.    Review of Systems Pertinent positives and negatives in the history of present illness.     Objective:   Physical Exam BP 120/78   Pulse 100   Temp 98.7 F (37.1 C)   Resp 16   Wt 196 lb (88.9 kg)   LMP 10/05/2018   SpO2 99%   BMI 38.28 kg/m   Alert and oriented and in no distress. Pharyngeal area is normal. Neck is supple without adenopathy or thyromegaly. Cardiac exam shows a regular sinus rhythm without murmurs or gallops. Lungs are clear to auscultation. Extremities without edema, pulses intact. Equal LE strength. DTRs symmetric and no clonus. Skin is warm and dry.       Assessment & Plan:  Hypoalbuminemia - Plan: Urinalysis Dipstick, Prealbumin, Comprehensive metabolic panel  Prediabetes - Plan: Comprehensive metabolic panel  Paresthesia   Low serum calcium - Plan: Comprehensive metabolic panel, VITAMIN D 25 Hydroxy (Vit-D Deficiency, Fractures)  Leg cramping - Plan: Magnesium  Neg UPT Urinalysis dipstick: negative  Reviewed labs with patient. Normal TSH, Hgb A1c improved to 5.7% Discussed that her low protein level may be due to low dietary intake of protein and calories in general.  No red flag symptoms.  Calcium is low normal after corrected for hypoalbuminemia. Check vitamin D.  Increase protein level and increase to 2 meals per day or one meal and several snacks. A list of protein rich foods was given.  Advised to take a multi-vitamin including B12.  Check with her psychiatrist regarding complex medication list to see if any of these may be contributing to her symptoms.  Follow up in 4 weeks and she will increase protein, recheck level at that time.

## 2018-11-11 ENCOUNTER — Other Ambulatory Visit: Payer: Self-pay | Admitting: Family Medicine

## 2018-11-11 DIAGNOSIS — E559 Vitamin D deficiency, unspecified: Secondary | ICD-10-CM

## 2018-11-11 LAB — COMPREHENSIVE METABOLIC PANEL
ALT: 13 IU/L (ref 0–32)
AST: 10 IU/L (ref 0–40)
Albumin/Globulin Ratio: 1.7 (ref 1.2–2.2)
Albumin: 3.9 g/dL (ref 3.8–4.8)
Alkaline Phosphatase: 110 IU/L (ref 39–117)
BUN/Creatinine Ratio: 9 (ref 9–23)
BUN: 9 mg/dL (ref 6–20)
Bilirubin Total: <0.2 mg/dL (ref 0.0–1.2)
CO2: 23 mmol/L (ref 20–29)
Calcium: 9.4 mg/dL (ref 8.7–10.2)
Chloride: 100 mmol/L (ref 96–106)
Creatinine, Ser: 1 mg/dL (ref 0.57–1.00)
GFR calc Af Amer: 84 mL/min/{1.73_m2} (ref >59)
GFR calc non Af Amer: 73 mL/min/{1.73_m2} (ref >59)
Globulin, Total: 2.3 g/dL (ref 1.5–4.5)
Glucose: 73 mg/dL (ref 65–99)
Potassium: 4.8 mmol/L (ref 3.5–5.2)
Sodium: 139 mmol/L (ref 134–144)
Total Protein: 6.2 g/dL (ref 6.0–8.5)

## 2018-11-11 LAB — VITAMIN D 25 HYDROXY (VIT D DEFICIENCY, FRACTURES): Vit D, 25-Hydroxy: 4.5 ng/mL — ABNORMAL LOW (ref 30.0–100.0)

## 2018-11-11 LAB — PREALBUMIN: PREALBUMIN: 30 mg/dL (ref 14–35)

## 2018-11-11 LAB — MAGNESIUM: Magnesium: 2.1 mg/dL (ref 1.6–2.3)

## 2018-11-11 MED ORDER — VITAMIN D (ERGOCALCIFEROL) 1.25 MG (50000 UNIT) PO CAPS: 50000.0000 [IU] | ORAL_CAPSULE | ORAL | 0 refills | Status: DC

## 2018-11-21 DIAGNOSIS — F3131 Bipolar disorder, current episode depressed, mild: Secondary | ICD-10-CM | POA: Diagnosis not present

## 2018-11-25 DIAGNOSIS — F3131 Bipolar disorder, current episode depressed, mild: Secondary | ICD-10-CM | POA: Diagnosis not present

## 2018-12-08 ENCOUNTER — Ambulatory Visit: Payer: Medicare Other | Admitting: Family Medicine

## 2018-12-12 DIAGNOSIS — F3131 Bipolar disorder, current episode depressed, mild: Secondary | ICD-10-CM | POA: Diagnosis not present

## 2018-12-16 ENCOUNTER — Ambulatory Visit: Payer: Medicare Other | Admitting: Family Medicine

## 2019-01-01 DIAGNOSIS — F3131 Bipolar disorder, current episode depressed, mild: Secondary | ICD-10-CM | POA: Diagnosis not present

## 2019-01-07 ENCOUNTER — Other Ambulatory Visit: Payer: Self-pay

## 2019-01-07 ENCOUNTER — Telehealth: Payer: Self-pay | Admitting: Family Medicine

## 2019-01-07 ENCOUNTER — Encounter: Payer: Self-pay | Admitting: Family Medicine

## 2019-01-07 ENCOUNTER — Ambulatory Visit (INDEPENDENT_AMBULATORY_CARE_PROVIDER_SITE_OTHER): Payer: Medicare Other | Admitting: Family Medicine

## 2019-01-07 VITALS — BP 120/80 | HR 89 | Temp 97.0°F | Wt 198.0 lb

## 2019-01-07 DIAGNOSIS — G894 Chronic pain syndrome: Secondary | ICD-10-CM | POA: Diagnosis not present

## 2019-01-07 DIAGNOSIS — F319 Bipolar disorder, unspecified: Secondary | ICD-10-CM | POA: Diagnosis not present

## 2019-01-07 DIAGNOSIS — E559 Vitamin D deficiency, unspecified: Secondary | ICD-10-CM | POA: Diagnosis not present

## 2019-01-07 DIAGNOSIS — R252 Cramp and spasm: Secondary | ICD-10-CM | POA: Diagnosis not present

## 2019-01-07 NOTE — Progress Notes (Signed)
   Subjective:    Patient ID: Tiffany Juarez, female    DOB: October 20, 1980, 38 y.o.   MRN: [id number]  HPI Chief Complaint  Patient presents with  . follow-up    follow-up on leg cramps, discuss form- like a disability form for student loans,    Here with complaints of gradually improving bilateral leg cramps. States she now only has cramps 2-3 times per week and they are not as severe as before.    Vitamin D deficiency- her vitamin D was found to be 4.5, severely low. She has been taking a supplement. Needs her vitamin D level rechecked.   She also requests that I fill out forms stating she is disabled so that she does not have to pay back loans.  States she was declared disabled 16 years ago for mental health issues. Her psychiatrist is Dr. Casimiro Needle.  States chronic pain was also added to her disability.  Dr. Krista Blue, her neurologist is working with her in regards to neuropathic pain.  She is taking Lyrica and states this is not helping. Complains of feels like her skin is crawling.   I referred her to pain management at our first visit and she stopped going.  Denies fever, chills, chest pain, palpitations, shortness of breath, abdominal pain, N/V/D, urinary symptoms, LE edema.    Reviewed allergies, medications, past medical, surgical, family, and social history.   Review of Systems Pertinent positives and negatives in the history of present illness.     Objective:   Physical Exam BP 120/80   Pulse 89   Temp (!) 97 F (36.1 C) (Oral)   Wt 198 lb (89.8 kg)   SpO2 96%   BMI 38.67 kg/m   Alert and oriented and in non acute distress. Not otherwise examined.       Assessment & Plan:  Vitamin D deficiency - Plan: VITAMIN D 25 Hydroxy (Vit-D Deficiency, Fractures), recheck vitamin D level and adjust supplement as needed.   Bilateral leg cramps - Plan: improving. Reinforced hydration, warm baths and stretching before bed. Also recommend taking a multivitamin.   Bipolar affective  disorder, remission status unspecified ([place]) - Plan: continue seeing Dr. Casimiro Needle.   Chronic pain syndrome - Plan: she is aware that I do not treat chronic pain and that I recommend she continue seeing Dr. Krista Blue and consider chronic pain management.   Advised her to follow up with Dr. Casimiro Needle for help with documentation regarding her disability since she reports she is disabled due to mental health issues.

## 2019-01-07 NOTE — Telephone Encounter (Signed)
Spoke with the patient and she stated that she mostly feels pain in her shoulder, knees and lower back. She stated that the pain is all over. Please advise.

## 2019-01-07 NOTE — Telephone Encounter (Signed)
Unable to get in contact with the patient. I left a voicemail explaining that she needs to reach out to her to PCP in regards to her pain management. Office number provided in case she has any other neurological concerns.

## 2019-01-07 NOTE — Telephone Encounter (Signed)
Please call her and ask her to reach out to her PCP. I am unable to treat generalized pain. She needs a formal evaluation. We should also consider pain management should her PCP feel it is warranted.

## 2019-01-07 NOTE — Patient Instructions (Signed)
Start taking a multivitamin once daily as discussed.  Continue on the vitamin D supplement and we will call you with your results.   Call and schedule with your psychiatrist and neurologist as discussed.   For the leg cramps- continue to stay hydrated, stretch before going to bed. Even taking a warm bath before bed can help relax your muscles.

## 2019-01-07 NOTE — Telephone Encounter (Signed)
Pt is calling in stating her pain has been worse for the past week , she states she is hurting all over and dont know what to do , she states she is taking meds as told to do so.

## 2019-01-08 ENCOUNTER — Telehealth: Payer: Self-pay

## 2019-01-08 ENCOUNTER — Encounter: Payer: Self-pay | Admitting: Family Medicine

## 2019-01-08 LAB — VITAMIN D 25 HYDROXY (VIT D DEFICIENCY, FRACTURES): Vit D, 25-Hydroxy: 40.5 ng/mL (ref 30.0–100.0)

## 2019-01-08 NOTE — Telephone Encounter (Signed)
Spoke with patient, she understands that she needs to find a pain management clinic that she can afford.  She states the last one wanted to drug test her twice a week at a cost of 200 out of pocket each time.  She will do some searching and see what her options are.  She also said she understood that she needed to take her form to her psychiatrist. She was fine with all of this.

## 2019-01-08 NOTE — Telephone Encounter (Signed)
Pt stated she was seen yesterday and was told that she needs to see her Neurologist for her pain. When she contacted Neurology they told her she needs to see her PCP. They said they are not treating acute pain and some other options would be physical therapy or pain management. Pt stated pain management is too expensive for her and she needs to know what she should do at this point. Please advise and contact patient.

## 2019-01-08 NOTE — Telephone Encounter (Signed)
She was here with chronic pain complaints yesterday. Since her neurologist prescribed Lyrica and Mobic for her, I referred her back to them. I referred her to pain management in the past and she stopped going. We can try to refer her to a different pain management group if she would like. She is welcome to find a new PCP who is willing to manage her chronic pain.

## 2019-01-25 ENCOUNTER — Other Ambulatory Visit: Payer: Self-pay | Admitting: Neurology

## 2019-02-19 DIAGNOSIS — F3131 Bipolar disorder, current episode depressed, mild: Secondary | ICD-10-CM | POA: Diagnosis not present

## 2019-02-20 DIAGNOSIS — F3131 Bipolar disorder, current episode depressed, mild: Secondary | ICD-10-CM | POA: Diagnosis not present

## 2019-03-05 DIAGNOSIS — F3131 Bipolar disorder, current episode depressed, mild: Secondary | ICD-10-CM | POA: Diagnosis not present

## 2019-03-13 DIAGNOSIS — F3131 Bipolar disorder, current episode depressed, mild: Secondary | ICD-10-CM | POA: Diagnosis not present

## 2019-03-24 ENCOUNTER — Ambulatory Visit: Payer: Medicare Other | Admitting: Family Medicine

## 2019-03-24 NOTE — Progress Notes (Deleted)
PATIENT: Tiffany Juarez DOB: 04/06/1981  REASON FOR VISIT: follow up HISTORY FROM: patient  No chief complaint on file.    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  She has history of bipolar disorder, she was treated with different medications since she was 38 years old, currently on polypharmacy treatment, BuSpar 15 mg daily, lamotrigine 50 mg, Latuda 40 mg daily since August 2018, also gabapentin 900 mg 3 times a day, which only took the edge off her pain.  She has constellation of complaints,  Significant bilateral hip pain since 2014, no clear triggers, now constant 5 out of 10 without movement, much worsening with any weightbearing such as walking, bending over, standing,  She also complains of mild midline low back pain, no radiating pain, concurrent with her hip pain, she also noticed bilateral hand and feet swelling, numbness tingling, burning pain, bilateral ankle and wrist swelling, pain, she denies bowel and bladder incontinence,  She has history of chronic headaches getting worse since August 2018, holoacranial pressure headache, denies significant nausea, mild light sensitivity, lasting for hours to days, she is not taking any medications for it.  She also complains of mild low cervical pain, radiating pain to bilateral shoulder,  Laboratory evaluation in November 2018 showed normal A1c, 5.9, CMP,  UPDATE May 26 2018: Patient continue complains of diffuse body achy pain, previously has tried gabapentin, Cymbalta 60 mg, only provide temporary help, she was also seen by pain management, could not afford it anymore  In addition, she complains of episode of sudden loss of consciousness since October 2019, had total  of 5 spells, she describes one spells she was pulling coffee creamer into the cup, when she came around, the coffee creamer spilled all over-the-counter, she has no recollection of what happened, another example,she was ready to pick up her son, then came around, was late. There is elapseof time.  X-ray of bilateral hip was normal  Update 09/22/18 Tiffany Juarez is a 38 y.o. female here today for follow up alterations of awareness and bilateral hip pain. She is taking lamotrigine 200mg  daily and carbamazepine 200mg  BID prescribed by psychiatrist for mood stabilization. EEG was normal. She has not completed the MRI. She does not feel this is necessary. She is feeling well. She denies any episodes of alteration of awareness. Last event was 05/2018. She is taking Lyrica 50mg  TID and feels that this has significantly helped her hip pain. She is trying to walk at the park when the weather allows.    UPDATE  03/24/19 Tiffany Juarez is a 38 y.o. female here today for follow up for alterations of awareness and bilateral hip pain. She continues lamotrigine 200mg  daily and carbamazepine 200mg  BID prescribed by psychiatry.     REVIEW OF SYSTEMS: Out of a complete 14 system review of symptoms, the patient complains only of the following symptoms, and all other reviewed systems are negative.  ALLERGIES: Allergies  Allergen Reactions   Cephalosporins Anaphylaxis   Penicillins Anaphylaxis    HOME MEDICATIONS: Outpatient Medications Prior to Visit  Medication Sig Dispense Refill   acetaminophen (TYLENOL) 500 MG tablet Take 2 tablets (1,000 mg total) by mouth every 8 (eight) hours as needed for mild pain or moderate pain. 60 tablet 0  busPIRone (BUSPAR) 15 MG tablet Take 30 mg by mouth 2 (two) times daily.   4   carbamazepine (TEGRETOL) 200 MG tablet Take 200 mg by mouth 2 (two) times daily.  0   clonazePAM (KLONOPIN) 0.5 MG tablet Take 0.5 mg by mouth 2 (two) times daily. 1 as needed daily      lamoTRIgine (LAMICTAL) 200 MG tablet Take 200 mg by mouth daily.     Lurasidone HCl (LATUDA) 60 MG TABS Take 120 mg by mouth daily.      meloxicam (MOBIC) 15 MG tablet Take 1 tablet (15 mg total) by mouth as needed for pain. 30 tablet 11   metFORMIN (GLUCOPHAGE) 500 MG tablet Take 500 mg by mouth daily with breakfast.     pregabalin (LYRICA) 50 MG capsule TAKE 1 CAPSULE(50 MG) BY MOUTH THREE TIMES DAILY 90 capsule 5   Vitamin D, Ergocalciferol, (DRISDOL) 1.25 MG (50000 UT) CAPS capsule Take 1 capsule (50,000 Units total) by mouth every 7 (seven) days. 12 capsule 0   No facility-administered medications prior to visit.     PAST MEDICAL HISTORY: Past Medical History:  Diagnosis Date   Anginal pain (HCC)    Bipolar disorder ([place])    Depression    Fibroid    Fibromyalgia    Numbness and tingling    Prediabetes 11/06/2018    PAST SURGICAL HISTORY: Past Surgical History:  Procedure Laterality Date   CESAREAN SECTION     wisdom teeth      FAMILY HISTORY: Family History  Problem Relation Age of Onset   Depression Father    Schizophrenia Father    Depression Brother    Healthy Mother    Hypertension Maternal Grandmother    Diabetes Maternal Aunt     SOCIAL HISTORY: Social History   Socioeconomic History   Marital status: Single    Spouse name: Not on file   Number of children: 3   Years of education: 12   Highest education level: High school graduate  Occupational History   Occupation: Unemployed  Scientist, product/process development strain: Not on file   Food insecurity    Worry: Not on file    Inability: Not on file   Transportation needs    Medical: Not on file    Non-medical: Not on file  Tobacco Use   Smoking status: Current Every Day Smoker    Packs/day: 0.50    Types: Cigarettes   Smokeless tobacco: Never Used  Substance and Sexual Activity   Alcohol use: No    Comment: quit 2014   Drug use: No   Sexual activity: Yes     Birth control/protection: None  Lifestyle   Physical activity    Days per week: Not on file    Minutes per session: Not on file   Stress: Not on file  Relationships   Social connections    Talks on phone: Not on file    Gets together: Not on file    Attends religious service: Not on file    Active member of club or organization: Not on file    Attends meetings of clubs or organizations: Not on file    Relationship status: Not on file   Intimate partner violence    Fear of current or ex partner: Not on file    Emotionally abused: Not on file    Physically abused: Not on file    Forced sexual activity: Not on file  Other Topics Concern  Not on file  Social History Narrative   Lives at home with family.   Right-handed.   2 cups caffeine per day.      PHYSICAL EXAM  There were no vitals filed for this visit. There is no height or weight on file to calculate BMI.  Generalized: Well developed, in no acute distress  Cardiology: normal rate and rhythm, no murmur noted Neurological examination  Mentation: Alert oriented to time, place, history taking. Follows all commands speech and language fluent Cranial nerve II-XII: Pupils were equal round reactive to light. Extraocular movements were full, visual field were full on confrontational test. Facial sensation and strength were normal. Uvula tongue midline. Head turning and shoulder shrug  were normal and symmetric. Motor: The motor testing reveals 5 over 5 strength of all 4 extremities. Good symmetric motor tone is noted throughout.  Sensory: Sensory testing is intact to soft touch on all 4 extremities. No evidence of extinction is noted.  Coordination: Cerebellar testing reveals good finger-nose-finger and heel-to-shin bilaterally.  Gait and station: Gait is normal. Tandem gait is normal. Romberg is negative. No drift is seen.  Reflexes: Deep tendon reflexes are symmetric and normal bilaterally.   DIAGNOSTIC DATA (LABS,  IMAGING, TESTING) - I reviewed patient records, labs, notes, testing and imaging myself where available.  No flowsheet data found.   Lab Results  Component Value Date   WBC 7.5 11/07/2018   HGB 12.5 11/07/2018   HCT 39.1 11/07/2018   MCV 86 11/07/2018   PLT 273 11/07/2018      Component Value Date/Time   NA 139 11/10/2018 1149   K 4.8 11/10/2018 1149   CL 100 11/10/2018 1149   CO2 23 11/10/2018 1149   GLUCOSE 73 11/10/2018 1149   BUN 9 11/10/2018 1149   CREATININE 1.00 11/10/2018 1149   CALCIUM 9.4 11/10/2018 1149   PROT 6.2 11/10/2018 1149   ALBUMIN 3.9 11/10/2018 1149   AST 10 11/10/2018 1149   ALT 13 11/10/2018 1149   ALKPHOS 110 11/10/2018 1149   BILITOT <0.2 11/10/2018 1149   GFRNONAA 73 11/10/2018 1149   GFRAA 84 11/10/2018 1149   Lab Results  Component Value Date   CHOL 255 (H) 07/11/2018   HDL 56 07/11/2018   LDLCALC 170 (H) 07/11/2018   TRIG 143 07/11/2018   CHOLHDL 4.6 (H) 07/11/2018   Lab Results  Component Value Date   HGBA1C 5.7 (H) 11/07/2018   Lab Results  Component Value Date   VITAMINB12 368 11/07/2018   Lab Results  Component Value Date   TSH 2.590 11/07/2018       ASSESSMENT AND PLAN 38 y.o. year old female  has a past medical history of Anginal pain ([place]), Bipolar disorder ([place]), Depression, Fibroid, Fibromyalgia, Numbness and tingling, and Prediabetes (11/06/2018). here with ***    ICD-10-CM   1. Alteration consciousness  R40.4   2. Other chronic pain  G89.29        No orders of the defined types were placed in this encounter.    No orders of the defined types were placed in this encounter.     I spent 15 minutes with the patient. 50% of this time was spent counseling and educating patient on plan of care and medications.    Debbora Presto, FNP-C 03/24/2019, 9:20 AM [facility] [address] [place], [place] [address] [phone]

## 2019-03-25 ENCOUNTER — Encounter: Payer: Self-pay | Admitting: Family Medicine

## 2019-03-27 ENCOUNTER — Ambulatory Visit: Payer: Medicare Other | Admitting: Family Medicine

## 2019-04-10 DIAGNOSIS — F3131 Bipolar disorder, current episode depressed, mild: Secondary | ICD-10-CM | POA: Diagnosis not present

## 2019-05-08 DIAGNOSIS — F3131 Bipolar disorder, current episode depressed, mild: Secondary | ICD-10-CM | POA: Diagnosis not present

## 2019-06-01 DIAGNOSIS — F3131 Bipolar disorder, current episode depressed, mild: Secondary | ICD-10-CM | POA: Diagnosis not present

## 2019-06-25 DIAGNOSIS — F3131 Bipolar disorder, current episode depressed, mild: Secondary | ICD-10-CM | POA: Diagnosis not present

## 2019-06-26 DIAGNOSIS — F3131 Bipolar disorder, current episode depressed, mild: Secondary | ICD-10-CM | POA: Diagnosis not present

## 2019-07-21 DIAGNOSIS — F3131 Bipolar disorder, current episode depressed, mild: Secondary | ICD-10-CM | POA: Diagnosis not present

## 2019-07-23 ENCOUNTER — Other Ambulatory Visit: Payer: Self-pay | Admitting: Neurology

## 2019-07-23 DIAGNOSIS — R404 Transient alteration of awareness: Secondary | ICD-10-CM

## 2019-07-23 DIAGNOSIS — R52 Pain, unspecified: Secondary | ICD-10-CM

## 2019-08-24 DIAGNOSIS — F3131 Bipolar disorder, current episode depressed, mild: Secondary | ICD-10-CM | POA: Diagnosis not present

## 2019-08-28 DIAGNOSIS — F3131 Bipolar disorder, current episode depressed, mild: Secondary | ICD-10-CM | POA: Diagnosis not present

## 2019-09-18 DIAGNOSIS — F3131 Bipolar disorder, current episode depressed, mild: Secondary | ICD-10-CM | POA: Diagnosis not present

## 2019-10-06 DIAGNOSIS — F3131 Bipolar disorder, current episode depressed, mild: Secondary | ICD-10-CM | POA: Diagnosis not present

## 2019-10-20 NOTE — Progress Notes (Deleted)
   Subjective:    Patient ID: Tiffany Juarez, female    DOB: 27-Jan-1981, 39 y.o.   MRN: [id number]  HPI No chief complaint on file.  She is new to the practice and here for a complete physical exam. Previous medical care: Last CPE:  Other providers:  Past medical history: Surgeries:  Family history: Mental Health History:  Social history: Lives with ***, works as ***, *** Smoking, drinking alcohol, drug use  Diet: *** Excerise: ***  Immunizations:  Health maintenance:  Mammogram: Colonoscopy: Last Gynecological Exam: Last Menstrual cycle: Pregnancies:  Last Dental Exam: Last Eye Exam:  Wears seatbelt always, uses sunscreen, smoke detectors in home and functioning, does not text while driving and feels safe in home environment.   Reviewed allergies, medications, past medical, surgical, family, and social history.   Review of Systems Review of Systems Constitutional: -fever, -chills, -sweats, -unexpected weight change,-fatigue ENT: -runny nose, -ear pain, -sore throat Cardiology:  -chest pain, -palpitations, -edema Respiratory: -cough, -shortness of breath, -wheezing Gastroenterology: -abdominal pain, -nausea, -vomiting, -diarrhea, -constipation  Hematology: -bleeding or bruising problems Musculoskeletal: -arthralgias, -myalgias, -joint swelling, -back pain Ophthalmology: -vision changes Urology: -dysuria, -difficulty urinating, -hematuria, -urinary frequency, -urgency Neurology: -headache, -weakness, -tingling, -numbness       Objective:   Physical Exam There were no vitals taken for this visit.  General Appearance:    Alert, cooperative, no distress, appears stated age  Head:    Normocephalic, without obvious abnormality, atraumatic  Eyes:    PERRL, conjunctiva/corneas clear, EOM's intact, fundi    benign  Ears:    Normal TM's and external ear canals  Nose:   Nares normal, mucosa normal, no drainage or sinus   tenderness  Throat:   Lips, mucosa, and tongue  normal; teeth and gums normal  Neck:   Supple, no lymphadenopathy;  thyroid:  no   enlargement/tenderness/nodules; no carotid   bruit or JVD  Back:    Spine nontender, no curvature, ROM normal, no CVA     tenderness  Lungs:     Clear to auscultation bilaterally without wheezes, rales or     ronchi; respirations unlabored  Chest Wall:    No tenderness or deformity   Heart:    Regular rate and rhythm, S1 and S2 normal, no murmur, rub   or gallop  Breast Exam:    No tenderness, masses, or nipple discharge or inversion.      No axillary lymphadenopathy  Abdomen:     Soft, non-tender, nondistended, normoactive bowel sounds,    no masses, no hepatosplenomegaly  Genitalia:    Normal external genitalia without lesions.  BUS and vagina normal; cervix without lesions, or cervical motion tenderness. No abnormal vaginal discharge.  Uterus and adnexa not enlarged, nontender, no masses.  Pap performed  Rectal:    Not performed due to age<40 and no related complaints  Extremities:   No clubbing, cyanosis or edema  Pulses:   2+ and symmetric all extremities  Skin:   Skin color, texture, turgor normal, no rashes or lesions  Lymph nodes:   Cervical, supraclavicular, and axillary nodes normal  Neurologic:   CNII-XII intact, normal strength, sensation and gait; reflexes 2+ and symmetric throughout          Psych:   Normal mood, affect, hygiene and grooming.         Assessment & Plan:  Routine general medical examination at a health care facility  Prediabetes  Vitamin D deficiency

## 2019-10-21 ENCOUNTER — Encounter: Payer: Medicare Other | Admitting: Family Medicine

## 2019-10-28 DIAGNOSIS — F3131 Bipolar disorder, current episode depressed, mild: Secondary | ICD-10-CM | POA: Diagnosis not present

## 2019-11-16 NOTE — Progress Notes (Signed)
Subjective:    Patient ID: Tiffany Juarez, female    DOB: 01/09/1981, 39 y.o.   MRN: [id number]  HPI Chief Complaint  Patient presents with  . fasting cpe    fasting cpe, has obgyn, no other concerns   She is here for a complete physical exam. She has concerns regarding dry skin and ear canal itching but only around her son. Thinks scratching her ears may be a stress reducer.  Last CPE: years ago   Other providers: Dr. Hulan Fray - OB/GYN  Dr. Casimiro Needle- psychiatrist   Sees Dr. Casimiro Needle regularly for Bipolar disorder.   Complains of a 6 month history of worsening dry skin on her lower legs and feet. Using Eucerin, Aquaphor, Cocoa Butter. States it is not dry today. Has not used lotion today.   History of chronic pain and states she is not taking medication for it.   States she is taking metformin for "weight". Prescribed by Dr. Casimiro Needle.   States she wakes up coughing a lot and has . Spitting up mucous at times.   History of prediabetes. On metformin.  Elevated LDL in past. Eats fried foods and fatty foods.  History of vitamin D def- is not currently taking supplement   Social history: Lives with her fiance and their 6 kids, is not currently working  Smoking 1 ppd.  Drinks alcohol occasionally. Drinks 3-4 shots at one time.  No drug use.   Diet: does not eat breakfast or lunch. States she eats dinner.  Excerise: none  Immunizations: Tdap UTD.   Health maintenance:  Mammogram: never  Colonoscopy: never  Last Gynecological Exam: Last Menstrual cycle: 10/26/2019 Last Dental Exam: years ago  Last Eye Exam: years ago   Wears seatbelt always, smoke detectors in home and functioning, does not text while driving and feels safe in home environment.   Reviewed allergies, medications, past medical, surgical, family, and social history.   Review of Systems Review of Systems Constitutional: -fever, -chills, +sweats, -unexpected weight change,-fatigue ENT: -runny nose, -ear pain,  -sore throat Cardiology:  -chest pain, -palpitations, -edema Respiratory: -cough, -shortness of breath, -wheezing Gastroenterology: -abdominal pain, -nausea, -vomiting, -diarrhea, -constipation  Hematology: -bleeding or bruising problems Musculoskeletal: + chronic arthralgias, -myalgias, -joint swelling, -back pain Ophthalmology: -vision changes Urology: -dysuria, -difficulty urinating, -hematuria, -urinary frequency, -urgency Neurology: -headache, -weakness, -tingling, -numbness       Objective:   Physical Exam BP 110/70   Pulse 85   Temp 97.8 F (36.6 C)   Ht 5\' 1"  (1.549 m)   Wt 183 lb 9.6 oz (83.3 kg)   LMP 10/26/2019   BMI 34.69 kg/m   General Appearance:    Alert, cooperative, no distress, appears stated age  Head:    Normocephalic, without obvious abnormality, atraumatic  Eyes:    PERRL, conjunctiva/corneas clear, EOM's intact  Ears:    Normal TM's and external ear canals  Nose:   Nares normal, mucosa normal, no drainage or sinus   tenderness  Throat:   Lips, mucosa, and tongue normal; teeth and gums normal  Neck:   Supple, no lymphadenopathy;  thyroid:  no   enlargement/tenderness/nodules; no JVD  Back:    Spine nontender, no curvature, ROM normal, no CVA     tenderness  Lungs:     Clear to auscultation bilaterally without wheezes, rales or     ronchi; respirations unlabored  Chest Wall:    No tenderness or deformity   Heart:    Regular rate and rhythm, S1 and  S2 normal, no murmur, rub   or gallop  Breast Exam:    OB/GYN  Abdomen:     Soft, non-tender, nondistended, normoactive bowel sounds,    no masses, no hepatosplenomegaly  Genitalia:    OB/GYN  Rectal:    Not performed due to age<38 and no related complaints  Extremities:   No clubbing, cyanosis or edema  Pulses:   2+ and symmetric all extremities  Skin:   Skin color, texture, turgor normal, no rashes or lesions. Dry skin on lower anterior legs, normal skin otherwise.   Lymph nodes:   Cervical,  supraclavicular, and axillary nodes normal  Neurologic:   CNII-XII intact, normal strength, sensation and gait          Psych:   Normal mood, affect, hygiene and grooming.         Assessment & Plan:  Routine general medical examination at a health care facility - Plan: CBC with Differential/Platelet, Comprehensive metabolic panel, T4, free, TSH, T3, Lipid panel -Here today for fasting CPE.  Reviewed preventive health care.  She sees OB/GYN.  She has never had a Pap smear or colonoscopy due to age.  Reviewed immunizations.  Counseled on healthy lifestyle including diet and exercise.  We also discussed alcohol recommendations for females.  Discussed risk factors for heart disease including smoking and high cholesterol.  Discussed safety  Prediabetes - Plan: Hemoglobin A1c She is on Metformin.  This is prescribed by Dr. Casimiro Needle for "weight".  Check A1c and follow-up  Vitamin D deficiency - Plan: VITAMIN D 25 Hydroxy (Vit-D Deficiency, Fractures) -She is not currently taking a vitamin D supplement.  Check vitamin D level and follow-up  Obesity (BMI 30-39.9) - Plan: T4, free, TSH, T3, Lipid panel -Counseling on healthy diet and exercise.  Encouraged her to eat small frequent meals instead of skipping meals.  Smoker -Encouraged her to stop smoking.  No plan currently  Bipolar affective disorder, remission status unspecified ([place]) -This is being managed by Dr. Casimiro Needle.  She seems to be doing well  Night sweats - Plan: T4, free, TSH, T3 -Unknown etiology.  Check thyroid function and follow-up  Pure hypercholesterolemia - Plan: Lipid panel -Counseling on eating a low-fat, low-cholesterol diet to help lower cholesterol as well as increasing physical activity.  Discussed that this is a risk factor for heart disease down the road.  Dry skin -Counseling on using a good skin moisturizer especially after a bath or shower.  Exam unremarkable.  She will follow-up if she has another episode of  severely dry skin.

## 2019-11-16 NOTE — Patient Instructions (Addendum)
Try taking an over the counter allergy medication, non sedating, such as Allegra, Claritin, Xyzal or Zyrtec and let me know if your symptoms are not improving.    Preventive Care 13-38 Years Old, Female Preventive care refers to visits with your health care provider and lifestyle choices that can promote health and wellness. This includes:  A yearly physical exam. This may also be called an annual well check.  Regular dental visits and eye exams.  Immunizations.  Screening for certain conditions.  Healthy lifestyle choices, such as eating a healthy diet, getting regular exercise, not using drugs or products that contain nicotine and tobacco, and limiting alcohol use. What can I expect for my preventive care visit? Physical exam Your health care provider will check your:  Height and weight. This may be used to calculate body mass index (BMI), which tells if you are at a healthy weight.  Heart rate and blood pressure.  Skin for abnormal spots. Counseling Your health care provider may ask you questions about your:  Alcohol, tobacco, and drug use.  Emotional well-being.  Home and relationship well-being.  Sexual activity.  Eating habits.  Work and work Statistician.  Method of birth control.  Menstrual cycle.  Pregnancy history. What immunizations do I need?  Influenza (flu) vaccine  This is recommended every year. Tetanus, diphtheria, and pertussis (Tdap) vaccine  You may need a Td booster every 10 years. Varicella (chickenpox) vaccine  You may need this if you have not been vaccinated. Human papillomavirus (HPV) vaccine  If recommended by your health care provider, you may need three doses over 6 months. Measles, mumps, and rubella (MMR) vaccine  You may need at least one dose of MMR. You may also need a second dose. Meningococcal conjugate (MenACWY) vaccine  One dose is recommended if you are age 65-21 years and a first-year college student living in a  residence hall, or if you have one of several medical conditions. You may also need additional booster doses. Pneumococcal conjugate (PCV13) vaccine  You may need this if you have certain conditions and were not previously vaccinated. Pneumococcal polysaccharide (PPSV23) vaccine  You may need one or two doses if you smoke cigarettes or if you have certain conditions. Hepatitis A vaccine  You may need this if you have certain conditions or if you travel or work in places where you may be exposed to hepatitis A. Hepatitis B vaccine  You may need this if you have certain conditions or if you travel or work in places where you may be exposed to hepatitis B. Haemophilus influenzae type b (Hib) vaccine  You may need this if you have certain conditions. You may receive vaccines as individual doses or as more than one vaccine together in one shot (combination vaccines). Talk with your health care provider about the risks and benefits of combination vaccines. What tests do I need?  Blood tests  Lipid and cholesterol levels. These may be checked every 5 years starting at age 39.  Hepatitis C test.  Hepatitis B test. Screening  Diabetes screening. This is done by checking your blood sugar (glucose) after you have not eaten for a while (fasting).  Sexually transmitted disease (STD) testing.  BRCA-related cancer screening. This may be done if you have a family history of breast, ovarian, tubal, or peritoneal cancers.  Pelvic exam and Pap test. This may be done every 3 years starting at age 13. Starting at age 40, this may be done every 5 years if you  have a Pap test in combination with an HPV test. Talk with your health care provider about your test results, treatment options, and if necessary, the need for more tests. Follow these instructions at home: Eating and drinking   Eat a diet that includes fresh fruits and vegetables, whole grains, lean protein, and low-fat dairy.  Take vitamin  and mineral supplements as recommended by your health care provider.  Do not drink alcohol if: ? Your health care provider tells you not to drink. ? You are pregnant, may be pregnant, or are planning to become pregnant.  If you drink alcohol: ? Limit how much you have to 0-1 drink a day. ? Be aware of how much alcohol is in your drink. In the [place] one drink equals one 12 oz bottle of beer (355 mL), one 5 oz glass of wine (148 mL), or one 1 oz glass of hard liquor (44 mL). Lifestyle  Take daily care of your teeth and gums.  Stay active. Exercise for at least 30 minutes on 5 or more days each week.  Do not use any products that contain nicotine or tobacco, such as cigarettes, e-cigarettes, and chewing tobacco. If you need help quitting, ask your health care provider.  If you are sexually active, practice safe sex. Use a condom or other form of birth control (contraception) in order to prevent pregnancy and STIs (sexually transmitted infections). If you plan to become pregnant, see your health care provider for a preconception visit. What's next?  Visit your health care provider once a year for a well check visit.  Ask your health care provider how often you should have your eyes and teeth checked.  Stay up to date on all vaccines. This information is not intended to replace advice given to you by your health care provider. Make sure you discuss any questions you have with your health care provider. Document Revised: 03/20/2018 Document Reviewed: 03/20/2018 Elsevier Patient Education  2020 [employer].

## 2019-11-17 ENCOUNTER — Other Ambulatory Visit: Payer: Self-pay

## 2019-11-17 ENCOUNTER — Ambulatory Visit (INDEPENDENT_AMBULATORY_CARE_PROVIDER_SITE_OTHER): Payer: Medicare Other | Admitting: Family Medicine

## 2019-11-17 ENCOUNTER — Encounter: Payer: Self-pay | Admitting: Family Medicine

## 2019-11-17 VITALS — BP 110/70 | HR 85 | Temp 97.8°F | Ht 61.0 in | Wt 183.6 lb

## 2019-11-17 DIAGNOSIS — R7303 Prediabetes: Secondary | ICD-10-CM

## 2019-11-17 DIAGNOSIS — R61 Generalized hyperhidrosis: Secondary | ICD-10-CM | POA: Insufficient documentation

## 2019-11-17 DIAGNOSIS — L853 Xerosis cutis: Secondary | ICD-10-CM

## 2019-11-17 DIAGNOSIS — E669 Obesity, unspecified: Secondary | ICD-10-CM | POA: Diagnosis not present

## 2019-11-17 DIAGNOSIS — F17200 Nicotine dependence, unspecified, uncomplicated: Secondary | ICD-10-CM | POA: Diagnosis not present

## 2019-11-17 DIAGNOSIS — F319 Bipolar disorder, unspecified: Secondary | ICD-10-CM

## 2019-11-17 DIAGNOSIS — E7800 Pure hypercholesterolemia, unspecified: Secondary | ICD-10-CM

## 2019-11-17 DIAGNOSIS — E559 Vitamin D deficiency, unspecified: Secondary | ICD-10-CM

## 2019-11-17 DIAGNOSIS — Z0000 Encounter for general adult medical examination without abnormal findings: Secondary | ICD-10-CM

## 2019-11-17 HISTORY — DX: Obesity, unspecified: E66.9

## 2019-11-17 HISTORY — DX: Nicotine dependence, unspecified, uncomplicated: F17.200

## 2019-11-17 HISTORY — DX: Xerosis cutis: L85.3

## 2019-11-17 HISTORY — DX: Pure hypercholesterolemia, unspecified: E78.00

## 2019-11-17 HISTORY — DX: Generalized hyperhidrosis: R61

## 2019-11-18 ENCOUNTER — Other Ambulatory Visit: Payer: Self-pay | Admitting: Family Medicine

## 2019-11-18 DIAGNOSIS — E559 Vitamin D deficiency, unspecified: Secondary | ICD-10-CM

## 2019-11-18 LAB — CBC WITH DIFFERENTIAL/PLATELET
Basophils Absolute: 0 10*3/uL (ref 0.0–0.2)
Basos: 1 %
EOS (ABSOLUTE): 0.2 10*3/uL (ref 0.0–0.4)
Eos: 3 %
Hematocrit: 39.9 % (ref 34.0–46.6)
Hemoglobin: 13.5 g/dL (ref 11.1–15.9)
Immature Grans (Abs): 0 10*3/uL (ref 0.0–0.1)
Immature Granulocytes: 0 %
Lymphocytes Absolute: 2 10*3/uL (ref 0.7–3.1)
Lymphs: 33 %
MCH: 28.4 pg (ref 26.6–33.0)
MCHC: 33.8 g/dL (ref 31.5–35.7)
MCV: 84 fL (ref 79–97)
Monocytes Absolute: 0.3 10*3/uL (ref 0.1–0.9)
Monocytes: 6 %
Neutrophils Absolute: 3.4 10*3/uL (ref 1.4–7.0)
Neutrophils: 57 %
Platelets: 286 10*3/uL (ref 150–450)
RBC: 4.76 x10E6/uL (ref 3.77–5.28)
RDW: 14.6 % (ref 11.7–15.4)
WBC: 6 10*3/uL (ref 3.4–10.8)

## 2019-11-18 LAB — COMPREHENSIVE METABOLIC PANEL
ALT: 14 IU/L (ref 0–32)
AST: 15 IU/L (ref 0–40)
Albumin/Globulin Ratio: 1.8 (ref 1.2–2.2)
Albumin: 4.4 g/dL (ref 3.8–4.8)
Alkaline Phosphatase: 110 IU/L (ref 39–117)
BUN/Creatinine Ratio: 15 (ref 9–23)
BUN: 14 mg/dL (ref 6–20)
Bilirubin Total: <0.2 mg/dL (ref 0.0–1.2)
CO2: 23 mmol/L (ref 20–29)
Calcium: 9.5 mg/dL (ref 8.7–10.2)
Chloride: 101 mmol/L (ref 96–106)
Creatinine, Ser: 0.92 mg/dL (ref 0.57–1.00)
GFR calc Af Amer: 92 mL/min/{1.73_m2} (ref >59)
GFR calc non Af Amer: 80 mL/min/{1.73_m2} (ref >59)
Globulin, Total: 2.4 g/dL (ref 1.5–4.5)
Glucose: 95 mg/dL (ref 65–99)
Potassium: 5 mmol/L (ref 3.5–5.2)
Sodium: 137 mmol/L (ref 134–144)
Total Protein: 6.8 g/dL (ref 6.0–8.5)

## 2019-11-18 LAB — LIPID PANEL
Chol/HDL Ratio: 2.9 ratio (ref 0.0–4.4)
Cholesterol, Total: 251 mg/dL — ABNORMAL HIGH (ref 100–199)
HDL: 88 mg/dL (ref >39)
LDL Chol Calc (NIH): 153 mg/dL — ABNORMAL HIGH (ref 0–99)
Triglycerides: 62 mg/dL (ref 0–149)
VLDL Cholesterol Cal: 10 mg/dL (ref 5–40)

## 2019-11-18 LAB — T4, FREE: Free T4: 1.01 ng/dL (ref 0.82–1.77)

## 2019-11-18 LAB — HEMOGLOBIN A1C
Est. average glucose Bld gHb Est-mCnc: 114 mg/dL
Hgb A1c MFr Bld: 5.6 % (ref 4.8–5.6)

## 2019-11-18 LAB — TSH: TSH: 0.983 u[IU]/mL (ref 0.450–4.500)

## 2019-11-18 LAB — VITAMIN D 25 HYDROXY (VIT D DEFICIENCY, FRACTURES): Vit D, 25-Hydroxy: 10.1 ng/mL — ABNORMAL LOW (ref 30.0–100.0)

## 2019-11-18 LAB — T3: T3, Total: 100 ng/dL (ref 71–180)

## 2019-11-18 MED ORDER — VITAMIN D (ERGOCALCIFEROL) 1.25 MG (50000 UNIT) PO CAPS: 50000.0000 [IU] | ORAL_CAPSULE | ORAL | 0 refills | Status: DC

## 2019-12-15 DIAGNOSIS — F3131 Bipolar disorder, current episode depressed, mild: Secondary | ICD-10-CM | POA: Diagnosis not present

## 2019-12-16 DIAGNOSIS — Z79899 Other long term (current) drug therapy: Secondary | ICD-10-CM | POA: Diagnosis not present

## 2019-12-22 IMAGING — CR DG HIP (WITH OR WITHOUT PELVIS) 3-4V BILAT
4 series · 4 of 4 positions shown · non-contrast
Comparison: No recent.

CLINICAL DATA: Bilateral hip pain.

EXAM:
DG HIP (WITH OR WITHOUT PELVIS) 3-4V BILAT

[w hip ap left]
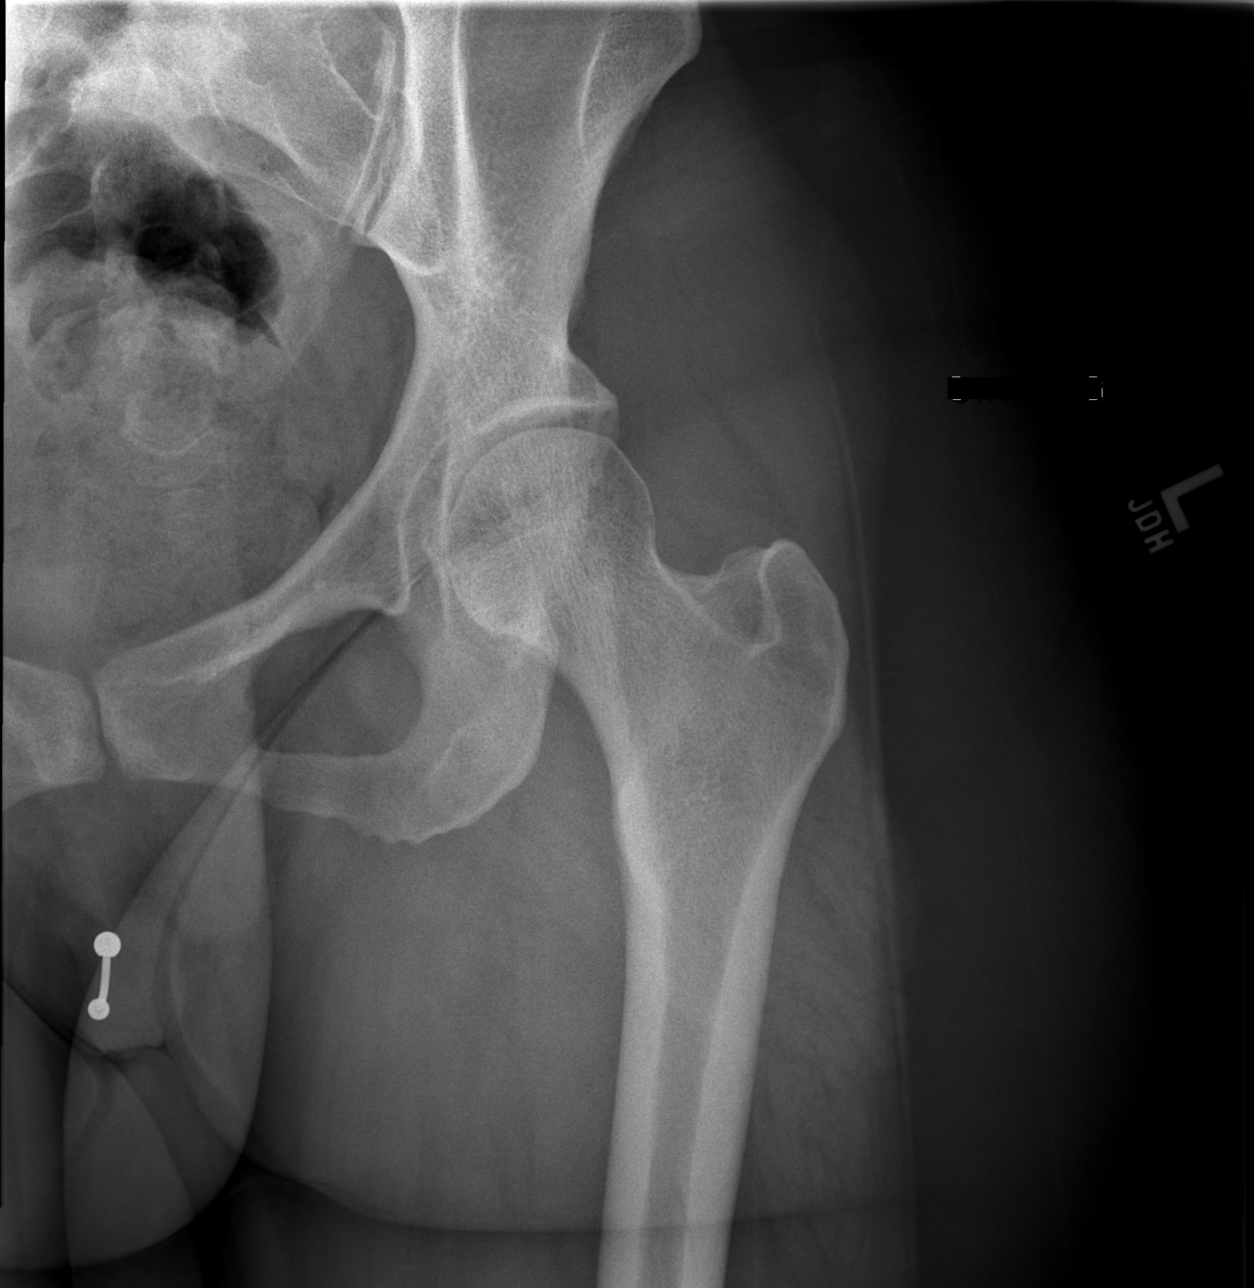

[w hip frog left]
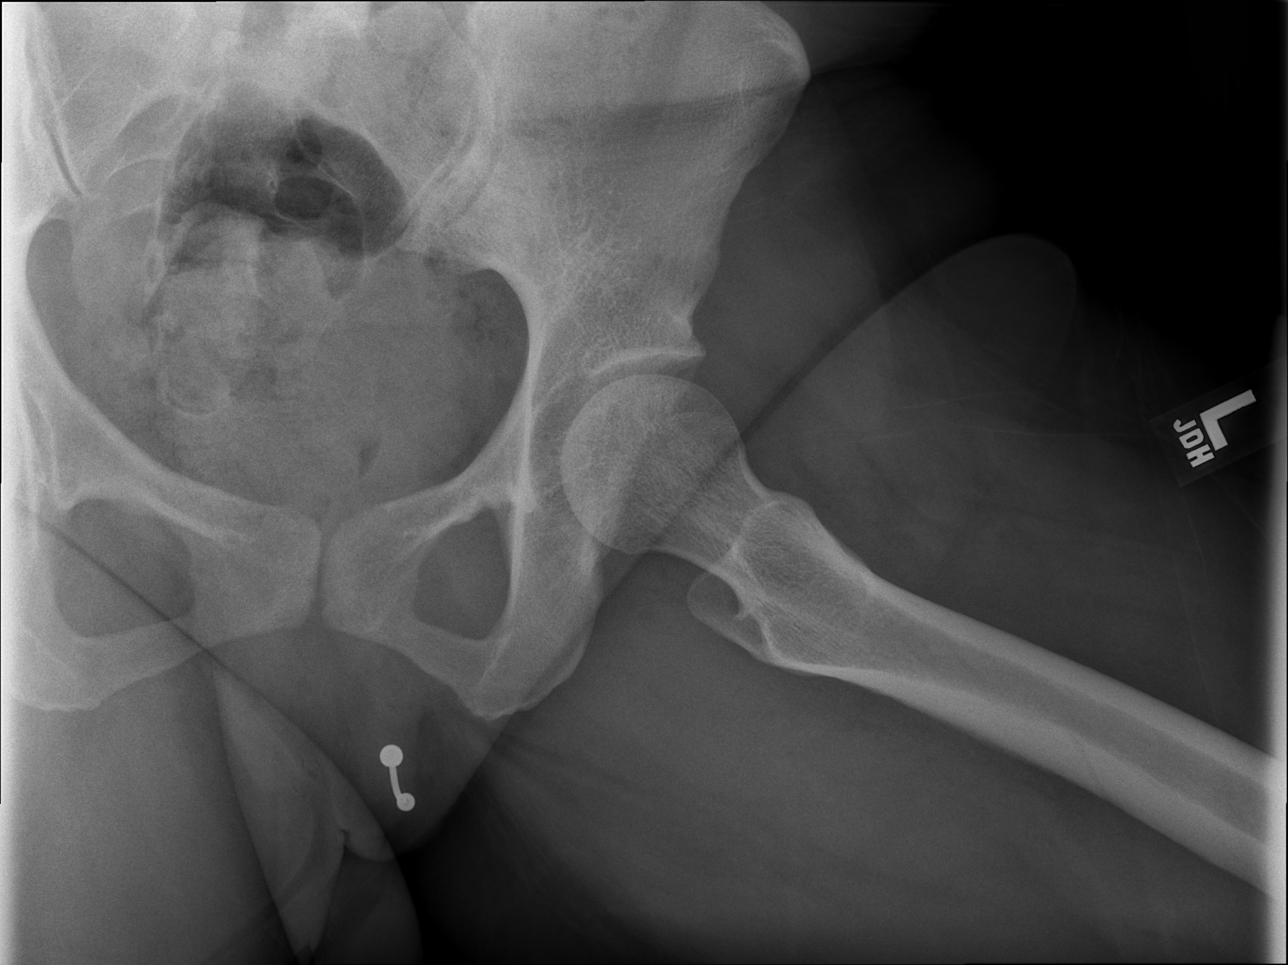

[w hip ap right]
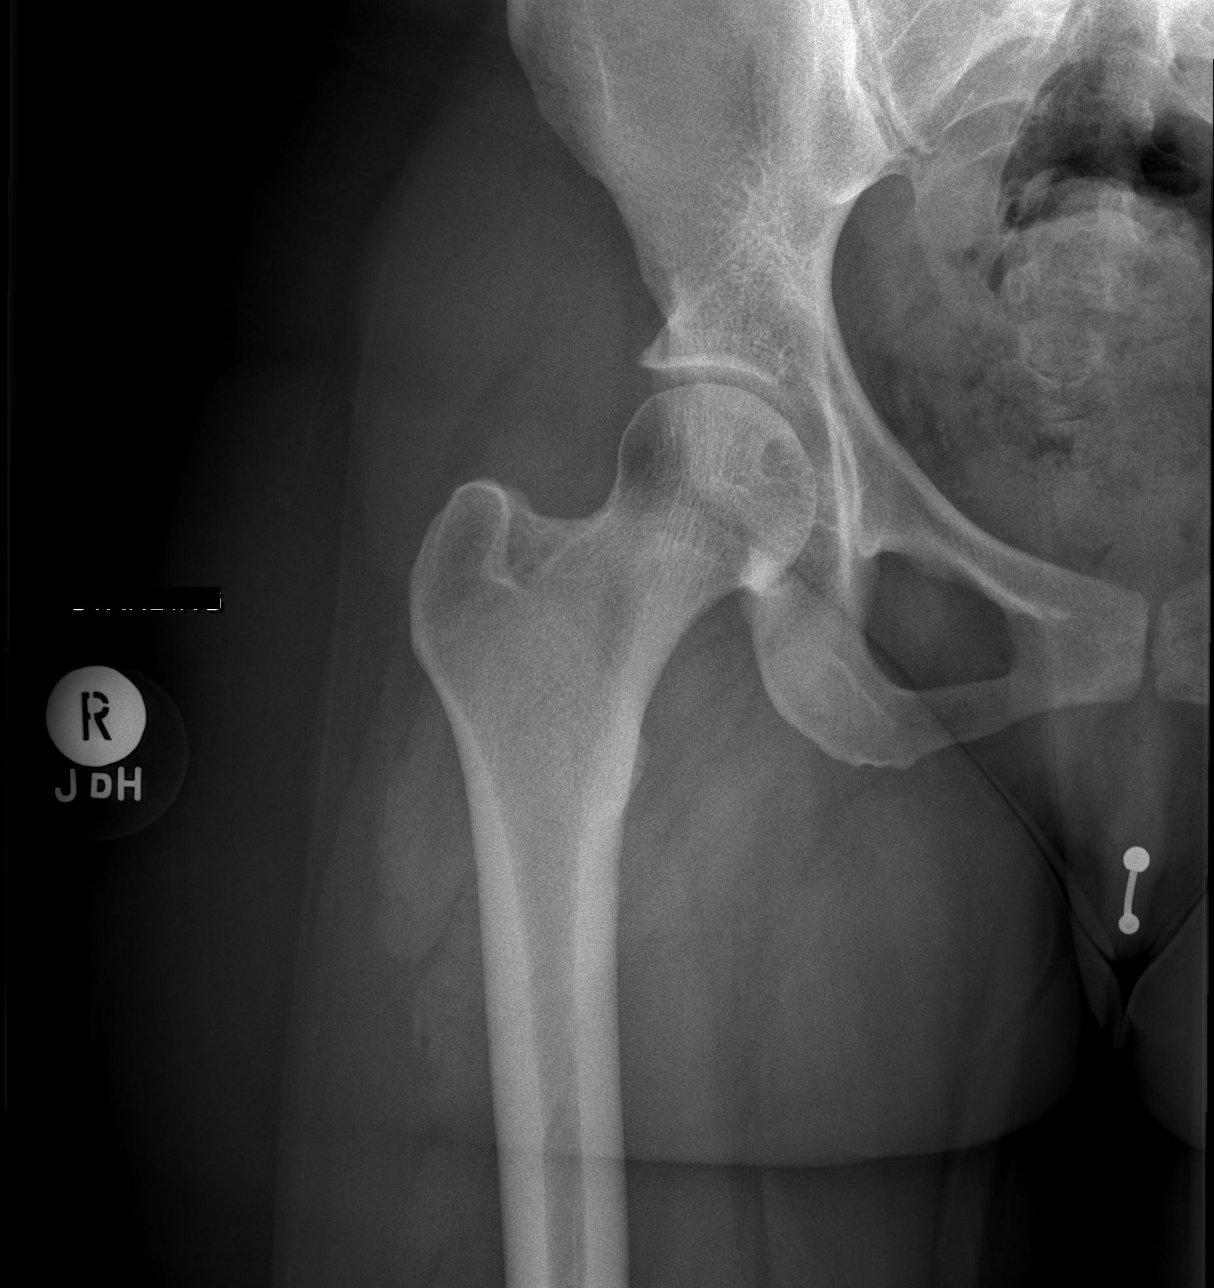

[w hip frog right]
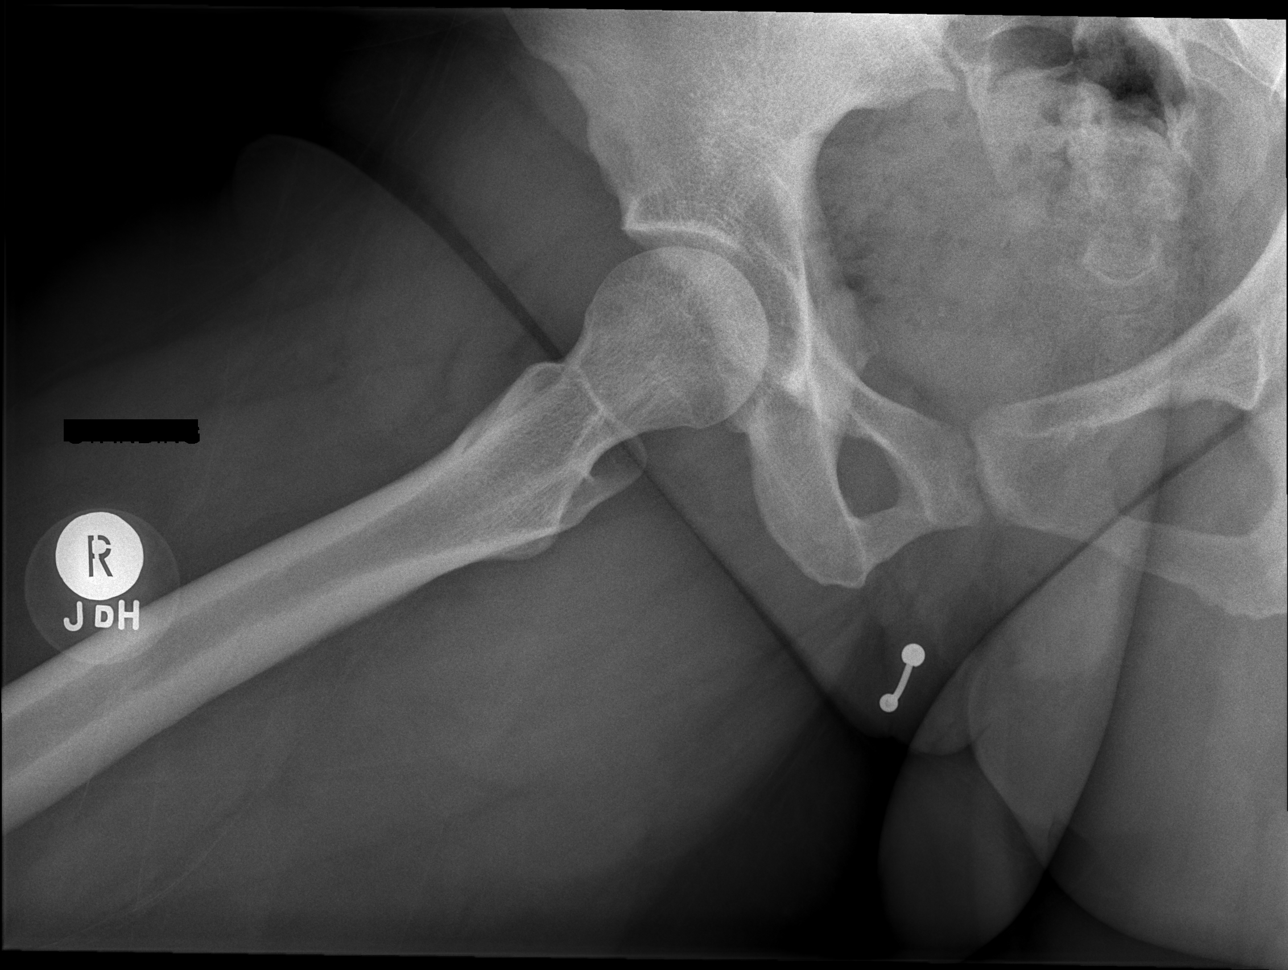

[4 of 4 positions shown; findings below may reference images not displayed]

FINDINGS: No acute soft tissue bony abnormality identified P mild bilateral
degenerative change. Questionable subchondral cyst right femoral
head.
IMPRESSION: 1. No acute abnormality identified. No evidence of fracture
dislocation.

2. Mild bilateral degenerative change. Questionable subchondral cyst
right femoral head.

## 2020-01-12 DIAGNOSIS — F3131 Bipolar disorder, current episode depressed, mild: Secondary | ICD-10-CM | POA: Diagnosis not present

## 2020-02-02 DIAGNOSIS — F3131 Bipolar disorder, current episode depressed, mild: Secondary | ICD-10-CM | POA: Diagnosis not present

## 2020-02-16 DIAGNOSIS — F3131 Bipolar disorder, current episode depressed, mild: Secondary | ICD-10-CM | POA: Diagnosis not present

## 2020-03-17 DIAGNOSIS — F3131 Bipolar disorder, current episode depressed, mild: Secondary | ICD-10-CM | POA: Diagnosis not present

## 2020-04-14 DIAGNOSIS — F3131 Bipolar disorder, current episode depressed, mild: Secondary | ICD-10-CM | POA: Diagnosis not present

## 2020-05-12 DIAGNOSIS — F3131 Bipolar disorder, current episode depressed, mild: Secondary | ICD-10-CM | POA: Diagnosis not present

## 2020-05-19 DIAGNOSIS — Z331 Pregnant state, incidental: Secondary | ICD-10-CM | POA: Diagnosis not present

## 2020-05-19 DIAGNOSIS — R112 Nausea with vomiting, unspecified: Secondary | ICD-10-CM | POA: Diagnosis not present

## 2020-05-19 DIAGNOSIS — R109 Unspecified abdominal pain: Secondary | ICD-10-CM | POA: Diagnosis not present

## 2020-06-09 DIAGNOSIS — F3131 Bipolar disorder, current episode depressed, mild: Secondary | ICD-10-CM | POA: Diagnosis not present

## 2020-06-28 ENCOUNTER — Other Ambulatory Visit (HOSPITAL_COMMUNITY)
Admission: RE | Admit: 2020-06-28 | Discharge: 2020-06-28 | Disposition: A | Discharge status: Home / Self Care | Payer: Medicare Other | Source: Ambulatory Visit | Attending: Advanced Practice Midwife | Admitting: Advanced Practice Midwife

## 2020-06-28 ENCOUNTER — Other Ambulatory Visit: Payer: Self-pay

## 2020-06-28 ENCOUNTER — Encounter: Payer: Self-pay | Admitting: Advanced Practice Midwife

## 2020-06-28 ENCOUNTER — Ambulatory Visit (INDEPENDENT_AMBULATORY_CARE_PROVIDER_SITE_OTHER): Payer: Medicare Other | Admitting: Advanced Practice Midwife

## 2020-06-28 VITALS — BP 113/76 | HR 83 | Wt 182.0 lb

## 2020-06-28 DIAGNOSIS — O09299 Supervision of pregnancy with other poor reproductive or obstetric history, unspecified trimester: Secondary | ICD-10-CM

## 2020-06-28 DIAGNOSIS — O09899 Supervision of other high risk pregnancies, unspecified trimester: Secondary | ICD-10-CM | POA: Insufficient documentation

## 2020-06-28 DIAGNOSIS — Z3A13 13 weeks gestation of pregnancy: Secondary | ICD-10-CM | POA: Diagnosis not present

## 2020-06-28 DIAGNOSIS — Z3A08 8 weeks gestation of pregnancy: Secondary | ICD-10-CM | POA: Diagnosis not present

## 2020-06-28 DIAGNOSIS — O09291 Supervision of pregnancy with other poor reproductive or obstetric history, first trimester: Secondary | ICD-10-CM | POA: Diagnosis not present

## 2020-06-28 DIAGNOSIS — O09529 Supervision of elderly multigravida, unspecified trimester: Secondary | ICD-10-CM | POA: Insufficient documentation

## 2020-06-28 DIAGNOSIS — Z8751 Personal history of pre-term labor: Secondary | ICD-10-CM | POA: Insufficient documentation

## 2020-06-28 DIAGNOSIS — O09891 Supervision of other high risk pregnancies, first trimester: Secondary | ICD-10-CM | POA: Diagnosis not present

## 2020-06-28 DIAGNOSIS — O09521 Supervision of elderly multigravida, first trimester: Secondary | ICD-10-CM

## 2020-06-28 LAB — POCT URINALYSIS DIPSTICK
Bilirubin, UA: NEGATIVE
Blood, UA: NEGATIVE
Glucose, UA: NEGATIVE
Ketones, UA: NEGATIVE
Leukocytes, UA: NEGATIVE
Nitrite, UA: NEGATIVE
Protein, UA: NEGATIVE
Spec Grav, UA: 1.01 (ref 1.010–1.025)
Urobilinogen, UA: 0.2 E.U./dL
pH, UA: 7.5 (ref 5.0–8.0)

## 2020-06-28 MED ORDER — METOCLOPRAMIDE HCL 5 MG PO TABS: 5.0000 mg | ORAL_TABLET | Freq: Four times a day (QID) | ORAL | 1 refills | Status: DC | PRN

## 2020-06-28 MED ORDER — ASPIRIN 81 MG PO CHEW: 81.0000 mg | CHEWABLE_TABLET | Freq: Every day | ORAL | Status: DC

## 2020-06-28 MED ORDER — DOXYLAMINE-PYRIDOXINE 10-10 MG PO TBEC: 2.0000 | DELAYED_RELEASE_TABLET | Freq: Every evening | ORAL | 5 refills | Status: DC | PRN

## 2020-06-28 NOTE — Progress Notes (Addendum)
DATING AND VIABILITY SONOGRAM   Tiffany Juarez is a 39 y.o. year old [phone] with LMP Patient's last menstrual period was 03/27/2020. which would correlate to  [redacted]w[redacted]d weeks gestation.  She has regular menstrual cycles.   She is here today for a confirmatory initial sonogram.    GESTATION: SINGLE[place]     FETAL ACTIVITY:          Heart rate    171          The fetus is active.     GESTATIONAL AGE AND  BIOMETRICS:  Gestational criteria: Estimated Date of Delivery: 01/01/21 by LMP now at [redacted]w[redacted]d  Previous Scans:0  GESTATIONAL SAC           2.50 cm       7-5 weeks  CROWN RUMP LENGTH           1.77 cm        8-2 weeks                                                                               AVERAGE EGA(BY THIS SCAN):  8-1 weeks  WORKING EDD( early ultrasound ):  02/07/2020   TECHNICIAN COMMENTS:  Patient informed that the ultrasound is considered a limited obstetric ultrasound and is not intended to be a complete ultrasound exam. Patient also informed that the ultrasound is not being completed with the intent of assessing for fetal or placental anomalies or any pelvic abnormalities. Explained that the purpose of today's ultrasound is to assess for fetal heart rate. Patient acknowledges the purpose of the exam and the limitations of the study.     Kathrene Alu 06/28/2020 9:25 AM  Patient was assessed and managed by nursing staff during this encounter. I have reviewed the chart and agree with the documentation and plan. I have also made any necessary editorial changes.  Hansel Feinstein, CNM 06/28/2020 12:59 PM

## 2020-06-28 NOTE — Patient Instructions (Signed)
Genetic Testing During Pregnancy Genetic testing during pregnancy is also called prenatal genetic testing. This type of testing can determine if your baby is at risk of being born with a disorder caused by abnormal genes or chromosomes (genetic disorder). Chromosomes contain genes that control how your baby will develop in your womb. There are many different genetic disorders. Examples of genetic disorders that may be found through genetic testing include Down syndrome and cystic fibrosis. Gene changes (mutations) can be passed down through families. Genetic testing is offered to all women before or during pregnancy. You can choose whether to have genetic testing. Why is genetic testing done? Genetic testing is done during pregnancy to find out whether your child is at risk for a genetic disorder. Having genetic testing allows you to:  Discuss your test results and options with a genetic counselor.  Prepare for a baby that may be born with a genetic disorder. Learning about the disorder ahead of time helps you be better prepared to manage it. Your health care providers can also be prepared in case your baby requires special care before or after birth.  Consider whether you want to continue with the pregnancy. In some cases, genetic testing may be done to learn about the traits a child will inherit. Types of genetic tests There are two basic types of genetic testing. Screening tests indicate whether your developing baby (fetus) is at higher risk for a genetic disorder. Diagnostic tests check actual fetal cells to diagnose a genetic disorder. Screening tests     Screening tests will not harm your baby. They are recommended for all pregnant women. Types of screening tests include:  Carrier screening. This test involves checking genes from both parents by testing their blood or saliva. The test checks to find out if the parents carry a genetic mutation that may be passed to a baby. In most cases,  both parents must carry the mutation for a baby to be at risk.  First trimester screening. This test combines a blood test with sound wave imaging of your baby (fetal ultrasound). This screening test checks for a risk of Down syndrome or other defects caused by having extra chromosomes. It also checks for defects of the heart, abdomen, or skeleton.  Second trimester screening also combines a blood test with a fetal ultrasound exam. It checks for a risk of genetic defects of the face, brain, spine, heart, or limbs.  Combined or sequential screening. This type of testing combines the results of first and second trimester screening. This type of testing may be more accurate than first or second trimester screening alone.  Cell-free DNA testing. This is a blood test that detects cells released by the placenta that get into the mother's blood. It can be used to check for a risk of Down syndrome, other extra chromosome syndromes, and disorders caused by abnormal numbers of sex chromosomes. This test can be done any time after 10 weeks of pregnancy.  Diagnostic tests Diagnostic tests carry slight risks of problems, including bleeding, infection, and loss of the pregnancy. These tests are done only if your baby is at risk for a genetic disorder. You may meet with a genetic counselor to discuss the risks and benefits before having diagnostic tests. Examples of diagnostic tests include:  Chorionic villus sampling (CVS). This involves a procedure to remove and test a sample of cells taken from the placenta. The procedure may be done between 10 and 12 weeks of pregnancy.  Amniocentesis. This involves a  procedure to remove and test a sample of fluid (amniotic fluid) and cells from the sac that surrounds the developing baby. The procedure may be done between 15 and 20 weeks of pregnancy. What do the results mean? For a screening test:  If the results are negative, it often means that your child is not at higher  risk. There is still a slight chance your child could have a genetic disorder.  If the results are positive, it does not mean your child will have a genetic disorder. It may mean that your child has a higher-than-normal risk for a genetic disorder. In that case, you may want to talk with a genetic counselor about whether you should have diagnostic genetic tests. For a diagnostic test:  If the result is negative, it is unlikely that your child will have a genetic disorder.  If the test is positive for a genetic disorder, it is likely that your child will have the disorder. The test may not tell how severe the disorder will be. Talk with your health care provider about your options. Questions to ask your health care provider Before talking to your health care provider about genetic testing, find out if there is a history of genetic disorders in your family. It may also help to know your family's ethnic origins. Then ask your health care provider the following questions:  Is my baby at risk for a genetic disorder?  What are the benefits of having genetic screening?  What tests are best for me and my baby?  What are the risks of each test?  If I get a positive result on a screening test, what is the next step?  Should I meet with a genetic counselor before having a diagnostic test?  Should my partner or other members of my family be tested?  How much do the tests cost? Will my insurance cover the testing? Summary  Genetic testing is done during pregnancy to find out whether your child is at risk for a genetic disorder.  Genetic testing is offered to all women before or during pregnancy. You can choose whether to have genetic testing.  There are two basic types of genetic testing. Screening tests indicate whether your developing baby (fetus) is at higher risk for a genetic disorder. Diagnostic tests check actual fetal cells to diagnose a genetic disorder.  If a diagnostic genetic test is  positive, talk with your health care provider about your options. This information is not intended to replace advice given to you by your health care provider. Make sure you discuss any questions you have with your health care provider. Document Revised: 10/30/2018 Document Reviewed: 09/23/2017 Elsevier Patient Education  [place] of Pregnancy The first trimester of pregnancy is from week 1 until the end of week 13 (months 1 through 3). A week after a sperm fertilizes an egg, the egg will implant on the wall of the uterus. This embryo will begin to develop into a baby. Genes from you and your partner will form the baby. The female genes will determine whether the baby will be a boy or a girl. At 6-8 weeks, the eyes and face will be formed, and the heartbeat can be seen on ultrasound. At the end of 12 weeks, all the baby's organs will be formed. Now that you are pregnant, you will want to do everything you can to have a healthy baby. Two of the most important things are to get good prenatal care and to  follow your health care provider's instructions. Prenatal care is all the medical care you receive before the baby's birth. This care will help prevent, find, and treat any problems during the pregnancy and childbirth. Body changes during your first trimester Your body goes through many changes during pregnancy. The changes vary from woman to woman.  You may gain or lose a couple of pounds at first.  You may feel sick to your stomach (nauseous) and you may throw up (vomit). If the vomiting is uncontrollable, call your health care provider.  You may tire easily.  You may develop headaches that can be relieved by medicines. All medicines should be approved by your health care provider.  You may urinate more often. Painful urination may mean you have a bladder infection.  You may develop heartburn as a result of your pregnancy.  You may develop constipation because certain  hormones are causing the muscles that push stool through your intestines to slow down.  You may develop hemorrhoids or swollen veins (varicose veins).  Your breasts may begin to grow larger and become tender. Your nipples may stick out more, and the tissue that surrounds them (areola) may become darker.  Your gums may bleed and may be sensitive to brushing and flossing.  Dark spots or blotches (chloasma, mask of pregnancy) may develop on your face. This will likely fade after the baby is born.  Your menstrual periods will stop.  You may have a loss of appetite.  You may develop cravings for certain kinds of food.  You may have changes in your emotions from day to day, such as being excited to be pregnant or being concerned that something may go wrong with the pregnancy and baby.  You may have more vivid and strange dreams.  You may have changes in your hair. These can include thickening of your hair, rapid growth, and changes in texture. Some women also have hair loss during or after pregnancy, or hair that feels dry or thin. Your hair will most likely return to normal after your baby is born. What to expect at prenatal visits During a routine prenatal visit:  You will be weighed to make sure you and the baby are growing normally.  Your blood pressure will be taken.  Your abdomen will be measured to track your baby's growth.  The fetal heartbeat will be listened to between weeks 10 and 14 of your pregnancy.  Test results from any previous visits will be discussed. Your health care provider may ask you:  How you are feeling.  If you are feeling the baby move.  If you have had any abnormal symptoms, such as leaking fluid, bleeding, severe headaches, or abdominal cramping.  If you are using any tobacco products, including cigarettes, chewing tobacco, and electronic cigarettes.  If you have any questions. Other tests that may be performed during your first trimester  include:  Blood tests to find your blood type and to check for the presence of any previous infections. The tests will also be used to check for low iron levels (anemia) and protein on red blood cells (Rh antibodies). Depending on your risk factors, or if you previously had diabetes during pregnancy, you may have tests to check for high blood sugar that affects pregnant women (gestational diabetes).  Urine tests to check for infections, diabetes, or protein in the urine.  An ultrasound to confirm the proper growth and development of the baby.  Fetal screens for spinal cord problems (spina bifida) and  Down syndrome.  HIV (human immunodeficiency virus) testing. Routine prenatal testing includes screening for HIV, unless you choose not to have this test.  You may need other tests to make sure you and the baby are doing well. Follow these instructions at home: Medicines  Follow your health care provider's instructions regarding medicine use. Specific medicines may be either safe or unsafe to take during pregnancy.  Take a prenatal vitamin that contains at least 600 micrograms (mcg) of folic acid.  If you develop constipation, try taking a stool softener if your health care provider approves. Eating and drinking   Eat a balanced diet that includes fresh fruits and vegetables, whole grains, good sources of protein such as meat, eggs, or tofu, and low-fat dairy. Your health care provider will help you determine the amount of weight gain that is right for you.  Avoid raw meat and uncooked cheese. These carry germs that can cause birth defects in the baby.  Eating four or five small meals rather than three large meals a day may help relieve nausea and vomiting. If you start to feel nauseous, eating a few soda crackers can be helpful. Drinking liquids between meals, instead of during meals, also seems to help ease nausea and vomiting.  Limit foods that are high in fat and processed sugars, such  as fried and sweet foods.  To prevent constipation: ? Eat foods that are high in fiber, such as fresh fruits and vegetables, whole grains, and beans. ? Drink enough fluid to keep your urine clear or pale yellow. Activity  Exercise only as directed by your health care provider. Most women can continue their usual exercise routine during pregnancy. Try to exercise for 30 minutes at least 5 days a week. Exercising will help you: ? Control your weight. ? Stay in shape. ? Be prepared for labor and delivery.  Experiencing pain or cramping in the lower abdomen or lower back is a good sign that you should stop exercising. Check with your health care provider before continuing with normal exercises.  Try to avoid standing for long periods of time. Move your legs often if you must stand in one place for a long time.  Avoid heavy lifting.  Wear low-heeled shoes and practice good posture.  You may continue to have sex unless your health care provider tells you not to. Relieving pain and discomfort  Wear a good support bra to relieve breast tenderness.  Take warm sitz baths to soothe any pain or discomfort caused by hemorrhoids. Use hemorrhoid cream if your health care provider approves.  Rest with your legs elevated if you have leg cramps or low back pain.  If you develop varicose veins in your legs, wear support hose. Elevate your feet for 15 minutes, 3-4 times a day. Limit salt in your diet. Prenatal care  Schedule your prenatal visits by the twelfth week of pregnancy. They are usually scheduled monthly at first, then more often in the last 2 months before delivery.  Write down your questions. Take them to your prenatal visits.  Keep all your prenatal visits as told by your health care provider. This is important. Safety  Wear your seat belt at all times when driving.  Make a list of emergency phone numbers, including numbers for family, friends, the hospital, and police and fire  departments. General instructions  Ask your health care provider for a referral to a local prenatal education class. Begin classes no later than the beginning of month 6 of your  pregnancy.  Ask for help if you have counseling or nutritional needs during pregnancy. Your health care provider can offer advice or refer you to specialists for help with various needs.  Do not use hot tubs, steam rooms, or saunas.  Do not douche or use tampons or scented sanitary pads.  Do not cross your legs for long periods of time.  Avoid cat litter boxes and soil used by cats. These carry germs that can cause birth defects in the baby and possibly loss of the fetus by miscarriage or stillbirth.  Avoid all smoking, herbs, alcohol, and medicines not prescribed by your health care provider. Chemicals in these products affect the formation and growth of the baby.  Do not use any products that contain nicotine or tobacco, such as cigarettes and e-cigarettes. If you need help quitting, ask your health care provider. You may receive counseling support and other resources to help you quit.  Schedule a dentist appointment. At home, brush your teeth with a soft toothbrush and be gentle when you floss. Contact a health care provider if:  You have dizziness.  You have mild pelvic cramps, pelvic pressure, or nagging pain in the abdominal area.  You have persistent nausea, vomiting, or diarrhea.  You have a bad smelling vaginal discharge.  You have pain when you urinate.  You notice increased swelling in your face, hands, legs, or ankles.  You are exposed to fifth disease or chickenpox.  You are exposed to [place] measles (rubella) and have never had it. Get help right away if:  You have a fever.  You are leaking fluid from your vagina.  You have spotting or bleeding from your vagina.  You have severe abdominal cramping or pain.  You have rapid weight gain or loss.  You vomit blood or material that  looks like coffee grounds.  You develop a severe headache.  You have shortness of breath.  You have any kind of trauma, such as from a fall or a car accident. Summary  The first trimester of pregnancy is from week 1 until the end of week 13 (months 1 through 3).  Your body goes through many changes during pregnancy. The changes vary from woman to woman.  You will have routine prenatal visits. During those visits, your health care provider will examine you, discuss any test results you may have, and talk with you about how you are feeling. This information is not intended to replace advice given to you by your health care provider. Make sure you discuss any questions you have with your health care provider. Document Revised: 06/21/2017 Document Reviewed: 06/20/2016 Elsevier Patient Education  2020 [employer].

## 2020-06-28 NOTE — Progress Notes (Signed)
Subjective:    Tiffany Juarez is a [id number] [redacted]w[redacted]d being seen today for her first obstetrical visit.  Her obstetrical history is significant for advanced maternal age and history of preeclampsia, previous cesarean delivery . Patient does intend to breast feed. Pregnancy history fully reviewed.  Patient reports nausea.  Also having a lot of fatigue.  Husband Tiffany Juarez is very helpful. .  Vitals:   06/28/20 0853  BP: 113/76  Pulse: 83  Weight: 182 lb (82.6 kg)    HISTORY: OB History  Gravida Para Term Preterm AB Living  6 3 1 2 2 3   SAB TAB Ectopic Multiple Live Births  1 1     3     # Outcome Date GA Lbr Len/2nd Weight Sex Delivery Anes PTL Lv  6 Current           5 Preterm 10/06/13 [redacted]w[redacted]d   M CS-LTranv   LIV  4 Term 08/03/04 [redacted]w[redacted]d  6 lb 12 oz (3.062 kg) F Vag-Spont   LIV  3 Preterm 06/12/98 [redacted]w[redacted]d  2 lb 11 oz (1.219 kg) M Vag-Spont  Y LIV  2 SAB           1 TAB            Past Medical History:  Diagnosis Date  . Anginal pain ([place])   . Bipolar disorder ([place])   . Depression   . Fibroid   . Fibromyalgia   . Numbness and tingling   . Prediabetes 11/06/2018   Past Surgical History:  Procedure Laterality Date  . CESAREAN SECTION    . wisdom teeth     Family History  Problem Relation Age of Onset  . Depression Father   . Schizophrenia Father   . Depression Brother   . Healthy Mother   . Hypertension Maternal Grandmother   . Cancer Maternal Grandmother   . Diabetes Maternal Aunt      Exam    Uterus:  Fundal Height: 8 cm  Pelvic Exam:    Perineum: No Hemorrhoids, Normal Perineum   Vulva: Bartholin's, Urethra, Skene's normal   Vagina:  normal mucosa   pH:    Cervix: no bleeding following Pap   Adnexa: no mass, fullness, tenderness   Bony Pelvis: gynecoid  System: Breast:  normal appearance, no masses or tenderness   Skin: normal coloration and turgor, no rashes    Neurologic: oriented, grossly non-focal   Extremities: normal strength, tone, and muscle mass   HEENT  neck supple with midline trachea   Mouth/Teeth mucous membranes moist, pharynx normal without lesions   Neck supple and no masses   Cardiovascular: regular rate and rhythm   Respiratory:  appears well, vitals normal, no respiratory distress, acyanotic, normal RR, ear and throat exam is normal, neck free of mass or lymphadenopathy, chest clear, no wheezing, crepitations, rhonchi, normal symmetric air entry   Abdomen: soft, non-tender; bowel sounds normal; no masses,  no organomegaly   Urinary: urethral meatus normal      Assessment:    Pregnancy: [id number] Patient Active Problem List   Diagnosis Date Noted  . Supervision of other high risk pregnancy, antepartum, unspecified trimester 06/28/2020  . AMA (advanced maternal age) multigravida 35+ 06/28/2020  . Hx of preeclampsia, prior pregnancy, currently pregnant 06/28/2020  . Smoker 11/17/2019  . Obesity (BMI 30-39.9) 11/17/2019  . Pure hypercholesterolemia 11/17/2019  . Dry skin 11/17/2019  . Night sweats 11/17/2019  . Vitamin D deficiency 11/11/2018  . Prediabetes 11/06/2018  . Alteration consciousness 05/26/2018  .  Other chronic pain 05/26/2018  . Sleep walking 05/15/2018  . Bipolar disorder ([place])   . Paresthesia 10/09/2017  . Pain of both hip joints 10/09/2017        Plan:  Initial labs drawn. Continue prenatal vitamins. Discussed and offered genetic screening options, including Quad screen/AFP, NIPS testing, and option to decline testing. Also mentioned CVS and Amnio for more accurate diagnosis of prenatal genetic conditions.  Benefits/risks/alternatives reviewed. Pt aware that anatomy US is form of genetic screening with lower accuracy in detecting trisomies than blood work.  Pt chooses genetic screening today. NIPS: ordered for 12 weeks. Ultrasound discussed; fetal anatomic survey: ordered. Problem list reviewed and updated. The nature of [facility] with multiple MDs and other Advanced  Practice Providers was explained to patient; also emphasized that residents, students are part of our team. Routine obstetric precautions reviewed.  Will recommend baby ASA starting at 12 wks for AMA and hx preeclampsia Plans repeat C/S, will need MD visit to discuss further Makena discussed, undecided .  Follow up in 4 weeks. 50% of 30 min visit spent on counseling and coordination of care.     Tiffany Juarez 06/28/2020

## 2020-06-30 LAB — CBC/D/PLT+RPR+RH+ABO+RUB AB...
Antibody Screen: NEGATIVE
Basophils Absolute: 0 10*3/uL (ref 0.0–0.2)
Basos: 0 %
EOS (ABSOLUTE): 0.1 10*3/uL (ref 0.0–0.4)
Eos: 1 %
HCV Ab: <0.1 s/co ratio (ref 0.0–0.9)
HIV Screen 4th Generation wRfx: NONREACTIVE
Hematocrit: 37.1 % (ref 34.0–46.6)
Hemoglobin: 12.5 g/dL (ref 11.1–15.9)
Hepatitis B Surface Ag: NEGATIVE
Immature Grans (Abs): 0 10*3/uL (ref 0.0–0.1)
Immature Granulocytes: 0 %
Lymphocytes Absolute: 2 10*3/uL (ref 0.7–3.1)
Lymphs: 25 %
MCH: 28.4 pg (ref 26.6–33.0)
MCHC: 33.7 g/dL (ref 31.5–35.7)
MCV: 84 fL (ref 79–97)
Monocytes Absolute: 0.4 10*3/uL (ref 0.1–0.9)
Monocytes: 5 %
Neutrophils Absolute: 5.4 10*3/uL (ref 1.4–7.0)
Neutrophils: 69 %
Platelets: 290 10*3/uL (ref 150–450)
RBC: 4.4 x10E6/uL (ref 3.77–5.28)
RDW: 14 % (ref 11.7–15.4)
RPR Ser Ql: NONREACTIVE
Rh Factor: POSITIVE
Rubella Antibodies, IGG: 1.41 index (ref >0.99)
WBC: 8 10*3/uL (ref 3.4–10.8)

## 2020-06-30 LAB — HEMOGLOBPATHY+FER W/A THAL RFX
Ferritin: 70 ng/mL (ref 15–150)
Hgb A2: 2.3 % (ref 1.8–3.2)
Hgb A: 97.7 % (ref 96.4–98.8)
Hgb F: 0 % (ref 0.0–2.0)
Hgb S: 0 %

## 2020-06-30 LAB — HEMOGLOBIN A1C
Est. average glucose Bld gHb Est-mCnc: 126 mg/dL
Hgb A1c MFr Bld: 6 % — ABNORMAL HIGH (ref 4.8–5.6)

## 2020-06-30 LAB — HCV INTERPRETATION

## 2020-06-30 LAB — URINE CULTURE, OB REFLEX

## 2020-06-30 LAB — CULTURE, OB URINE

## 2020-07-01 LAB — CYTOLOGY - PAP
Chlamydia: NEGATIVE
Comment: NEGATIVE
Comment: NEGATIVE
Comment: NORMAL
Diagnosis: NEGATIVE
High risk HPV: NEGATIVE
Neisseria Gonorrhea: NEGATIVE

## 2020-07-06 DIAGNOSIS — F3131 Bipolar disorder, current episode depressed, mild: Secondary | ICD-10-CM | POA: Diagnosis not present

## 2020-07-11 DIAGNOSIS — F3131 Bipolar disorder, current episode depressed, mild: Secondary | ICD-10-CM | POA: Diagnosis not present

## 2020-07-28 ENCOUNTER — Other Ambulatory Visit: Payer: Self-pay

## 2020-07-28 ENCOUNTER — Ambulatory Visit (INDEPENDENT_AMBULATORY_CARE_PROVIDER_SITE_OTHER): Payer: Medicare Other | Admitting: Family Medicine

## 2020-07-28 VITALS — BP 122/74 | HR 93 | Wt 178.0 lb

## 2020-07-28 DIAGNOSIS — M9905 Segmental and somatic dysfunction of pelvic region: Secondary | ICD-10-CM

## 2020-07-28 DIAGNOSIS — F319 Bipolar disorder, unspecified: Secondary | ICD-10-CM | POA: Diagnosis not present

## 2020-07-28 DIAGNOSIS — O09529 Supervision of elderly multigravida, unspecified trimester: Secondary | ICD-10-CM

## 2020-07-28 DIAGNOSIS — M545 Low back pain, unspecified: Secondary | ICD-10-CM

## 2020-07-28 DIAGNOSIS — Z8751 Personal history of pre-term labor: Secondary | ICD-10-CM

## 2020-07-28 DIAGNOSIS — M9903 Segmental and somatic dysfunction of lumbar region: Secondary | ICD-10-CM

## 2020-07-28 DIAGNOSIS — O09299 Supervision of pregnancy with other poor reproductive or obstetric history, unspecified trimester: Secondary | ICD-10-CM

## 2020-07-28 DIAGNOSIS — R7303 Prediabetes: Secondary | ICD-10-CM

## 2020-07-28 DIAGNOSIS — M9904 Segmental and somatic dysfunction of sacral region: Secondary | ICD-10-CM

## 2020-07-28 DIAGNOSIS — O09899 Supervision of other high risk pregnancies, unspecified trimester: Secondary | ICD-10-CM

## 2020-07-28 DIAGNOSIS — E669 Obesity, unspecified: Secondary | ICD-10-CM

## 2020-07-28 NOTE — Progress Notes (Signed)
Patient complaining of nausea and unable to eat much. Armandina Stammer RN

## 2020-07-28 NOTE — Progress Notes (Signed)
   PRENATAL VISIT NOTE  Subjective:  Tiffany Juarez is a 40 y.o. [phone] at [redacted]w[redacted]d being seen today for ongoing prenatal care.  She is currently monitored for the following issues for this high-risk pregnancy and has Paresthesia; Pain of both hip joints; Bipolar disorder (HCC); Sleep walking; Alteration consciousness; Other chronic pain; Prediabetes; Vitamin D deficiency; Smoker; Obesity (BMI 30-39.9); Pure hypercholesterolemia; Dry skin; Night sweats; Supervision of other high risk pregnancy, antepartum, unspecified trimester; AMA (advanced maternal age) multigravida 35+; Hx of preeclampsia, prior pregnancy, currently pregnant; and History of preterm delivery on their problem list.  Patient reports backache, nausea and vomiting.  Contractions: Not present. Vag. Bleeding: None.  Movement: Absent. Denies leaking of fluid.   The following portions of the patient's history were reviewed and updated as appropriate: allergies, current medications, past family history, past medical history, past social history, past surgical history and problem list.   Objective:   Vitals:   07/28/20 0900  BP: 122/74  Pulse: 93  Weight: 178 lb (80.7 kg)    Fetal Status: Fetal Heart Rate (bpm): 150   Movement: Absent     General:  Alert, oriented and cooperative. Patient is in no acute distress.  Skin: Skin is warm and dry. No rash noted.   Cardiovascular: Normal heart rate noted  Respiratory: Normal respiratory effort, no problems with respiration noted  Abdomen: Soft, gravid, appropriate for gestational age.  Pain/Pressure: Present     Pelvic: Cervical exam deferred        Extremities: Normal range of motion.  Edema: None  Mental Status: Normal mood and affect. Normal behavior. Normal judgment and thought content.   MSK: Restriction, tenderness, tissue texture changes, and paraspinal spasm in the lumbar spine  Neuro: Moves all four extremities with no focal neurological deficit   OSE: Head   Cervical    Thoracic   Rib   Lumbar L5 ESRR  Sacrum L/L  Pelvis Right ant innom      Assessment and Plan:  Pregnancy: [id number] at [redacted]w[redacted]d 1. Supervision of other high risk pregnancy, antepartum, unspecified trimester FHT and FH normal  2. Antepartum multigravida of advanced maternal age Start ASA 81mg   3. Hx of preeclampsia, prior pregnancy, currently pregnant  4. Bipolar affective disorder, remission status unspecified (HCC) Not currently on medications.  Is connected with psychiatrist  5. Prediabetes HgA1c 6.0 Will check early 2hr GTT  6. History of preterm delivery Discussed Makena Patient willing  7. Obesity (BMI 30-39.9)  8. Lumbar back pain 9. Somatic dysfunction of lumbar region 10. Somatic dysfunction of pelvis region 11. Somatic dysfunction of sacral region OMT done after patient permission. HVLA technique utilized. 3 areas treated with improvement of tissue texture and joint mobility. Patient tolerated procedure well.    Preterm labor symptoms and general obstetric precautions including but not limited to vaginal bleeding, contractions, leaking of fluid and fetal movement were reviewed in detail with the patient. Please refer to After Visit Summary for other counseling recommendations.   No follow-ups on file.  Future Appointments  Date Time Provider Department Center  08/02/2020  8:30 AM [facility] NURSE [facility] None  08/25/2020  9:30 AM 10/23/2020, DO [facility] None  11/17/2020  9:15 AM 11/19/2020, NP-C [facility]    Avanell Shackleton, DO

## 2020-08-02 ENCOUNTER — Other Ambulatory Visit: Payer: Medicare Other

## 2020-08-02 ENCOUNTER — Telehealth: Payer: Self-pay | Admitting: Family Medicine

## 2020-08-02 ENCOUNTER — Telehealth: Payer: Self-pay

## 2020-08-02 MED ORDER — ENSURE HEALTHY MOM PO LIQD: 237.0000 mL | Freq: Two times a day (BID) | ORAL | 24 refills | Status: AC

## 2020-08-02 NOTE — Telephone Encounter (Signed)
Pt called requesting a Rx for Ensure. A message will be sent to the provider.  Delayne Sanzo l Jaymie Misch, CMA

## 2020-08-02 NOTE — Telephone Encounter (Signed)
Prescription sent

## 2020-08-02 NOTE — Telephone Encounter (Signed)
-----   Message from Valentina Lucks, [place] sent at 08/02/2020  1:32 PM EST ----- Regarding: Ensure Pt called requesting a Rx for Ensure. Please send the Rx to [facility] on [address] in [place].

## 2020-08-03 ENCOUNTER — Other Ambulatory Visit (INDEPENDENT_AMBULATORY_CARE_PROVIDER_SITE_OTHER): Payer: Medicare Other

## 2020-08-03 ENCOUNTER — Other Ambulatory Visit: Payer: Self-pay

## 2020-08-03 DIAGNOSIS — Z3A13 13 weeks gestation of pregnancy: Secondary | ICD-10-CM | POA: Diagnosis not present

## 2020-08-03 DIAGNOSIS — R7309 Other abnormal glucose: Secondary | ICD-10-CM

## 2020-08-03 NOTE — Progress Notes (Signed)
Pt presents for early 2 hr GTT. Pt states she is fasting this morning. Pt was sent to the lab for GTT.  Tiffany Juarez Tiffany Juarez, CMA

## 2020-08-04 LAB — GLUCOSE TOLERANCE, 2 HOURS W/ 1HR
Glucose, 1 hour: 119 mg/dL (ref 65–179)
Glucose, 2 hour: 95 mg/dL (ref 65–152)
Glucose, Fasting: 89 mg/dL (ref 65–91)

## 2020-08-04 NOTE — Progress Notes (Signed)
Chart reviewed - agree with CMA/RN documentation.  ° °

## 2020-08-09 ENCOUNTER — Telehealth: Payer: Self-pay

## 2020-08-09 NOTE — Telephone Encounter (Signed)
Spoke with [place] about patient [facility] prescription. Authorized DAW for pharmacy to rerun as the hydroxyprogestrone was not covered and needed prior authorization. Kathrene Alu RN

## 2020-08-10 DIAGNOSIS — F3131 Bipolar disorder, current episode depressed, mild: Secondary | ICD-10-CM | POA: Diagnosis not present

## 2020-08-25 ENCOUNTER — Other Ambulatory Visit: Payer: Self-pay

## 2020-08-25 ENCOUNTER — Ambulatory Visit (INDEPENDENT_AMBULATORY_CARE_PROVIDER_SITE_OTHER): Payer: Medicare Other | Admitting: Family Medicine

## 2020-08-25 VITALS — BP 109/69 | HR 92 | Wt 177.0 lb

## 2020-08-25 DIAGNOSIS — Z8751 Personal history of pre-term labor: Secondary | ICD-10-CM

## 2020-08-25 DIAGNOSIS — E669 Obesity, unspecified: Secondary | ICD-10-CM

## 2020-08-25 DIAGNOSIS — O09899 Supervision of other high risk pregnancies, unspecified trimester: Secondary | ICD-10-CM | POA: Diagnosis not present

## 2020-08-25 DIAGNOSIS — O09522 Supervision of elderly multigravida, second trimester: Secondary | ICD-10-CM

## 2020-08-25 DIAGNOSIS — O09299 Supervision of pregnancy with other poor reproductive or obstetric history, unspecified trimester: Secondary | ICD-10-CM

## 2020-08-25 DIAGNOSIS — Z3A16 16 weeks gestation of pregnancy: Secondary | ICD-10-CM

## 2020-08-25 DIAGNOSIS — R519 Headache, unspecified: Secondary | ICD-10-CM

## 2020-08-25 DIAGNOSIS — R7303 Prediabetes: Secondary | ICD-10-CM

## 2020-08-25 DIAGNOSIS — O09292 Supervision of pregnancy with other poor reproductive or obstetric history, second trimester: Secondary | ICD-10-CM

## 2020-08-25 DIAGNOSIS — O09892 Supervision of other high risk pregnancies, second trimester: Secondary | ICD-10-CM

## 2020-08-25 MED ORDER — HYDROXYPROGESTERONE CAPROATE 275 MG/1.1ML ~~LOC~~ SOAJ
275.0000 mg | Freq: Once | SUBCUTANEOUS | Status: AC
  Administered 2020-08-25: 275 mg via SUBCUTANEOUS

## 2020-08-25 MED ORDER — CYCLOBENZAPRINE HCL 10 MG PO TABS: 10.0000 mg | ORAL_TABLET | Freq: Three times a day (TID) | ORAL | 2 refills | Status: DC | PRN

## 2020-08-25 NOTE — Addendum Note (Signed)
Addended by: Phill Myron on: 08/25/2020 10:52 AM   Modules accepted: Orders

## 2020-08-25 NOTE — Progress Notes (Signed)
   PRENATAL VISIT NOTE  Subjective:  Tiffany Juarez is a 40 y.o. [id number] at [redacted]w[redacted]d being seen today for ongoing prenatal care.  She is currently monitored for the following issues for this high-risk pregnancy and has Paresthesia; Pain of both hip joints; Bipolar disorder ([place]); Sleep walking; Alteration consciousness; Other chronic pain; Prediabetes; Vitamin D deficiency; Smoker; Obesity (BMI 30-39.9); Pure hypercholesterolemia; Dry skin; Night sweats; Supervision of other high risk pregnancy, antepartum, unspecified trimester; AMA (advanced maternal age) multigravida 35+; Hx of preeclampsia, prior pregnancy, currently pregnant; and History of preterm delivery on their problem list.  Patient reports had daily right sided headaches for a couple of weeks. No photo or phonophobia. Headache right sided and severe. Didn't really take anything - would go to bed because so severe. Hasn't had one in a few days..  Contractions: Not present. Vag. Bleeding: None.  Movement: Absent. Denies leaking of fluid.   The following portions of the patient's history were reviewed and updated as appropriate: allergies, current medications, past family history, past medical history, past social history, past surgical history and problem list.   Objective:   Vitals:   08/25/20 0931  BP: 109/69  Pulse: 92  Weight: 177 lb (80.3 kg)    Fetal Status: Fetal Heart Rate (bpm): 148 Fundal Height: 16 cm Movement: Absent     General:  Alert, oriented and cooperative. Patient is in no acute distress.  Skin: Skin is warm and dry. No rash noted.   Cardiovascular: Normal heart rate noted  Respiratory: Normal respiratory effort, no problems with respiration noted  Abdomen: Soft, gravid, appropriate for gestational age.  Pain/Pressure: Present     Pelvic: Cervical exam deferred        Extremities: Normal range of motion.  Edema: None  Mental Status: Normal mood and affect. Normal behavior. Normal judgment and thought content.    Assessment and Plan:  Pregnancy: [id number] at [redacted]w[redacted]d 1. [redacted] weeks gestation of pregnancy - Genetic Screening - AFP, Serum, Open Spina Bifida  2. Supervision of other high risk pregnancy, antepartum, unspecified trimester FHT and FH normal - Genetic Screening - AFP, Serum, Open Spina Bifida - [place] MFM OB COMP + 14 WK; Future  3. Multigravida of advanced maternal age in second trimester On ASA 81mg  - [place] MFM OB COMP + 65 WK; Future  4. Hx of preeclampsia, prior pregnancy, currently pregnant On ASA 81mg   5. History of preterm delivery Start makena today - Genetic Screening - AFP, Serum, Open Spina Bifida  6. Obesity (BMI 30-39.9)  7. Prediabetes Normal early 2hr GTT  8. Acute nonintractable headache, unspecified headache type Start flexeril. As patient less than 20 weeks and normal BP, doubtful that this represents preeclampsia. Feels different than her normal migraines. Will watch closely.   Preterm labor symptoms and general obstetric precautions including but not limited to vaginal bleeding, contractions, leaking of fluid and fetal movement were reviewed in detail with the patient. Please refer to After Visit Summary for other counseling recommendations.   Return in about 4 weeks (around 09/22/2020) for OB f/u.  Future Appointments  Date Time Provider [place]  09/01/2020 10:00 AM [facility] NURSE [facility] None  09/08/2020 10:00 AM [facility] NURSE [facility] None  09/15/2020 10:00 AM [facility] NURSE [facility] None  09/22/2020 10:00 AM Truett Mainland, DO [facility] None  11/17/2020  9:15 AM Girtha Rm, NP-C PFM-PFM [place], DO

## 2020-08-27 LAB — AFP, SERUM, OPEN SPINA BIFIDA
AFP MoM: 2.33
AFP Value: 80.8 ng/mL
Gest. Age on Collection Date: 16.3 weeks
Maternal Age At EDD: 39.1 yr
OSBR Risk 1 IN: 802
Test Results:: NEGATIVE
Weight: 177 [lb_av]

## 2020-09-01 ENCOUNTER — Ambulatory Visit (INDEPENDENT_AMBULATORY_CARE_PROVIDER_SITE_OTHER): Payer: Medicare Other

## 2020-09-01 ENCOUNTER — Other Ambulatory Visit: Payer: Self-pay

## 2020-09-01 VITALS — BP 107/73 | HR 98

## 2020-09-01 DIAGNOSIS — Z8751 Personal history of pre-term labor: Secondary | ICD-10-CM

## 2020-09-01 DIAGNOSIS — G44009 Cluster headache syndrome, unspecified, not intractable: Secondary | ICD-10-CM

## 2020-09-01 MED ORDER — HYDROXYPROGESTERONE CAPROATE 250 MG/ML IM OIL: 250.0000 mg | TOPICAL_OIL | INTRAMUSCULAR | Status: DC

## 2020-09-01 MED ORDER — HYDROXYPROGESTERONE CAPROATE 275 MG/1.1ML ~~LOC~~ SOAJ
275.0000 mg | SUBCUTANEOUS | Status: DC
  Administered 2020-09-01 – 2020-11-18 (×11): 275 mg via SUBCUTANEOUS

## 2020-09-01 NOTE — Progress Notes (Signed)
Patient presents for [facility] injection. Patient states that the flexiril she was given for headaches has helped some but not completely. Her headaches are still happening. Offered to refer to Allie Dimmer, headache specialist and patient accepts. Kathrene Alu RN

## 2020-09-02 DIAGNOSIS — F3131 Bipolar disorder, current episode depressed, mild: Secondary | ICD-10-CM | POA: Diagnosis not present

## 2020-09-05 ENCOUNTER — Telehealth: Payer: Self-pay

## 2020-09-05 NOTE — Telephone Encounter (Signed)
Called pt to discuss Panorama results. Pt made aware that her Lucina Mellow is low risk, pt also made aware that she is having a boy. Understanding was voiced. Brek Reece l Milano Rosevear, CMA

## 2020-09-08 ENCOUNTER — Telehealth: Payer: Self-pay

## 2020-09-08 ENCOUNTER — Ambulatory Visit (INDEPENDENT_AMBULATORY_CARE_PROVIDER_SITE_OTHER): Payer: Medicare Other

## 2020-09-08 ENCOUNTER — Other Ambulatory Visit: Payer: Self-pay

## 2020-09-08 VITALS — BP 112/65 | HR 91 | Wt 179.0 lb

## 2020-09-08 DIAGNOSIS — Z8751 Personal history of pre-term labor: Secondary | ICD-10-CM | POA: Diagnosis not present

## 2020-09-08 NOTE — Telephone Encounter (Signed)
Spoke to Tiffany Juarez at [facility]. Tiffany Juarez states she will f/u with [facility] pharmacy regarding the pt's Makena refill.  Tiffany Juarez l Tiffany Juarez, CMA

## 2020-09-08 NOTE — Progress Notes (Signed)
Chart reviewed - agree with CMA/RN documentation.  ° °

## 2020-09-08 NOTE — Progress Notes (Signed)
Tiffany Juarez here for 17-P  Injection.  Injection administered without complication. Patient will return in one week for next injection.  Lakeithia Rasor l Kahliyah Dick, CMA 09/08/2020  10:14 AM

## 2020-09-09 ENCOUNTER — Other Ambulatory Visit: Payer: Self-pay | Admitting: *Deleted

## 2020-09-09 ENCOUNTER — Ambulatory Visit: Payer: Medicare Other | Admitting: *Deleted

## 2020-09-09 ENCOUNTER — Encounter: Payer: Self-pay | Admitting: *Deleted

## 2020-09-09 ENCOUNTER — Ambulatory Visit: Payer: Medicare Other | Attending: Family Medicine

## 2020-09-09 DIAGNOSIS — Z8751 Personal history of pre-term labor: Secondary | ICD-10-CM | POA: Insufficient documentation

## 2020-09-09 DIAGNOSIS — O09899 Supervision of other high risk pregnancies, unspecified trimester: Secondary | ICD-10-CM | POA: Diagnosis not present

## 2020-09-09 DIAGNOSIS — R7303 Prediabetes: Secondary | ICD-10-CM

## 2020-09-09 DIAGNOSIS — D219 Benign neoplasm of connective and other soft tissue, unspecified: Secondary | ICD-10-CM

## 2020-09-09 DIAGNOSIS — E669 Obesity, unspecified: Secondary | ICD-10-CM | POA: Insufficient documentation

## 2020-09-09 DIAGNOSIS — O09299 Supervision of pregnancy with other poor reproductive or obstetric history, unspecified trimester: Secondary | ICD-10-CM

## 2020-09-09 DIAGNOSIS — O09522 Supervision of elderly multigravida, second trimester: Secondary | ICD-10-CM | POA: Diagnosis not present

## 2020-09-12 ENCOUNTER — Encounter: Payer: Self-pay | Admitting: Family Medicine

## 2020-09-12 DIAGNOSIS — D219 Benign neoplasm of connective and other soft tissue, unspecified: Secondary | ICD-10-CM | POA: Insufficient documentation

## 2020-09-15 ENCOUNTER — Ambulatory Visit: Payer: Medicare Other

## 2020-09-16 ENCOUNTER — Ambulatory Visit (INDEPENDENT_AMBULATORY_CARE_PROVIDER_SITE_OTHER): Payer: Medicare Other | Admitting: Obstetrics & Gynecology

## 2020-09-16 ENCOUNTER — Other Ambulatory Visit: Payer: Self-pay

## 2020-09-16 ENCOUNTER — Telehealth: Payer: Self-pay

## 2020-09-16 VITALS — BP 114/68 | HR 102 | Wt 178.0 lb

## 2020-09-16 DIAGNOSIS — O09899 Supervision of other high risk pregnancies, unspecified trimester: Secondary | ICD-10-CM | POA: Diagnosis not present

## 2020-09-16 DIAGNOSIS — Z3A19 19 weeks gestation of pregnancy: Secondary | ICD-10-CM | POA: Diagnosis not present

## 2020-09-16 DIAGNOSIS — O09299 Supervision of pregnancy with other poor reproductive or obstetric history, unspecified trimester: Secondary | ICD-10-CM

## 2020-09-16 DIAGNOSIS — D563 Thalassemia minor: Secondary | ICD-10-CM

## 2020-09-16 DIAGNOSIS — O09529 Supervision of elderly multigravida, unspecified trimester: Secondary | ICD-10-CM | POA: Diagnosis not present

## 2020-09-16 DIAGNOSIS — Z8751 Personal history of pre-term labor: Secondary | ICD-10-CM

## 2020-09-16 NOTE — Telephone Encounter (Signed)
Spoke with patient and r/s her appt to 11/03/20 due to MFM being closed for Good Friday. Confirmed appointment with patient.

## 2020-09-22 ENCOUNTER — Ambulatory Visit (INDEPENDENT_AMBULATORY_CARE_PROVIDER_SITE_OTHER): Payer: Medicare Other | Admitting: Family Medicine

## 2020-09-22 ENCOUNTER — Other Ambulatory Visit: Payer: Self-pay

## 2020-09-22 VITALS — BP 116/72 | HR 98 | Wt 179.0 lb

## 2020-09-22 DIAGNOSIS — M79671 Pain in right foot: Secondary | ICD-10-CM

## 2020-09-22 DIAGNOSIS — O09299 Supervision of pregnancy with other poor reproductive or obstetric history, unspecified trimester: Secondary | ICD-10-CM

## 2020-09-22 DIAGNOSIS — D563 Thalassemia minor: Secondary | ICD-10-CM

## 2020-09-22 DIAGNOSIS — O09899 Supervision of other high risk pregnancies, unspecified trimester: Secondary | ICD-10-CM

## 2020-09-22 DIAGNOSIS — Z3A20 20 weeks gestation of pregnancy: Secondary | ICD-10-CM

## 2020-09-22 DIAGNOSIS — O09529 Supervision of elderly multigravida, unspecified trimester: Secondary | ICD-10-CM | POA: Diagnosis not present

## 2020-09-22 DIAGNOSIS — R220 Localized swelling, mass and lump, head: Secondary | ICD-10-CM

## 2020-09-22 DIAGNOSIS — Z8751 Personal history of pre-term labor: Secondary | ICD-10-CM

## 2020-09-22 MED ORDER — LORATADINE 10 MG PO TABS: 10.0000 mg | ORAL_TABLET | Freq: Every day | ORAL | 3 refills | Status: DC

## 2020-09-22 NOTE — Progress Notes (Signed)
Patient complaining of mouth pain and feet pain. Kathrene Alu RN

## 2020-09-22 NOTE — Progress Notes (Signed)
   PRENATAL VISIT NOTE  Subjective:  Tiffany Juarez is a 40 y.o. [id number] at [redacted]w[redacted]d being seen today for ongoing prenatal care.  She is currently monitored for the following issues for this high-risk pregnancy and has Paresthesia; Pain of both hip joints; Bipolar disorder ([place]); Sleep walking; Alteration consciousness; Other chronic pain; Prediabetes; Vitamin D deficiency; Smoker; Obesity (BMI 30-39.9); Pure hypercholesterolemia; Dry skin; Night sweats; Supervision of other high risk pregnancy, antepartum, unspecified trimester; AMA (advanced maternal age) multigravida 35+; Hx of preeclampsia, prior pregnancy, currently pregnant; History of preterm delivery; and Fibroid on their problem list.  Patient reports mouth swelling and burning for the past couple of days. 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 Doesn't think she ate any, but not sure. Some lip swelling, but no difficulty breathing. Also right foot pain since Monday. In the forefoot. Doesn't walk a lot. .  Contractions: Not present. Vag. Bleeding: None.  Movement: Present. Denies leaking of fluid.   The following portions of the patient's history were reviewed and updated as appropriate: allergies, current medications, past family history, past medical history, past social history, past surgical history and problem list.   Objective:   Vitals:   09/22/20 1002  BP: 116/72  Pulse: 98  Weight: 179 lb (81.2 kg)    Fetal Status: Fetal Heart Rate (bpm): 150 Fundal Height: 20 cm Movement: Present     General:  Alert, oriented and cooperative. Patient is in no acute distress.  Skin: Skin is warm and dry. No rash noted.   Cardiovascular: Normal heart rate noted  Respiratory: Normal respiratory effort, no problems with respiration noted  Abdomen: Soft, gravid, appropriate for gestational age.  Pain/Pressure: Absent     Pelvic: Cervical exam deferred        Extremities: Normal range of motion.  Edema: None. Tenderness to right forefoot  Mental Status: Normal  mood and affect. Normal behavior. Normal judgment and thought content.   Assessment and Plan:  Pregnancy: [id number] at [redacted]w[redacted]d 1. [redacted] weeks gestation of pregnancy  2. Supervision of other high risk pregnancy, antepartum, unspecified trimester FHT and FH normal  3. Antepartum multigravida of advanced maternal age On ASA 81mg   4. Hx of preeclampsia, prior pregnancy, currently pregnant BP normal  5. History of preterm delivery Makena  6. Alpha thalassemia silent carrier Referred to genetics.  7. Lip swelling ? Wheat allergy. Start claritin  8. Foot pain, right Refer to sports medicine.  Preterm labor symptoms and general obstetric precautions including but not limited to vaginal bleeding, contractions, leaking of fluid and fetal movement were reviewed in detail with the patient. Please refer to After Visit Summary for other counseling recommendations.   No follow-ups on file.  Future Appointments  Date Time Provider [place]  09/29/2020 10:00 AM [facility] NURSE [facility] None  10/06/2020 10:00 AM [facility] NURSE [facility] None  10/13/2020 10:00 AM [facility] NURSE [facility] None  10/21/2020  9:45 AM Truett Mainland, DO [facility] None  10/28/2020 10:15 AM Teague Bobbye Morton, PA-C [facility]  11/03/2020  9:30 AM [facility] NURSE [facility]  11/03/2020  9:45 AM [facility] US5 [facility]  11/03/2020 10:30 AM W[facility] GENETIC COUNSELING RM [facility]  11/17/2020  9:15 AM Girtha Rm, NP-C PFM-PFM [place], DO

## 2020-09-26 ENCOUNTER — Ambulatory Visit: Payer: Medicare Other | Admitting: Family Medicine

## 2020-09-28 ENCOUNTER — Telehealth: Payer: Self-pay

## 2020-09-28 NOTE — Telephone Encounter (Signed)
Called [facility] to give them information on shipping the patients Makena auto injectors. Patient medication will be shipped to the office on Tuesday March15. Kathrene Alu RN

## 2020-09-29 ENCOUNTER — Ambulatory Visit (INDEPENDENT_AMBULATORY_CARE_PROVIDER_SITE_OTHER): Payer: Medicare Other

## 2020-09-29 ENCOUNTER — Other Ambulatory Visit: Payer: Self-pay

## 2020-09-29 DIAGNOSIS — Z8751 Personal history of pre-term labor: Secondary | ICD-10-CM | POA: Diagnosis not present

## 2020-09-29 NOTE — Progress Notes (Signed)
Patient presents for makena injection.  Patient tolerated injection well. Patient complaining of itching in one spot on back of her right arm. No visible hives. Patient advised to moitsurize and cover area to hold the moistirizer in. Patient has just started a daily antihistamine as well. Kathrene Alu RN

## 2020-09-30 NOTE — Progress Notes (Signed)
   PRENATAL VISIT NOTE  Subjective:  Tiffany Juarez is a 40 y.o. [id number] at [redacted]w[redacted]d being seen today for ongoing prenatal care.  She is currently monitored for the following issues for this high-risk pregnancy and has Paresthesia; Pain of both hip joints; Bipolar disorder ([place]); Sleep walking; Alteration consciousness; Other chronic pain; Prediabetes; Vitamin D deficiency; Smoker; Obesity (BMI 30-39.9); Pure hypercholesterolemia; Dry skin; Night sweats; Supervision of other high risk pregnancy, antepartum, unspecified trimester; AMA (advanced maternal age) multigravida 35+; Hx of preeclampsia, prior pregnancy, currently pregnant; History of preterm delivery; and Fibroid on their problem list.  Patient reports no complaints.  Contractions: Not present. Vag. Bleeding: None.  Movement: Present. Denies leaking of fluid.   The following portions of the patient's history were reviewed and updated as appropriate: allergies, current medications, past family history, past medical history, past social history, past surgical history and problem list.   Objective:   Vitals:   09/16/20 0857  BP: 114/68  Pulse: (!) 102  Weight: 178 lb (80.7 kg)    Fetal Status: Fetal Heart Rate (bpm): 146   Movement: Present     General:  Alert, oriented and cooperative. Patient is in no acute distress.  Skin: Skin is warm and dry. No rash noted.   Cardiovascular: Normal heart rate noted  Respiratory: Normal respiratory effort, no problems with respiration noted  Abdomen: Soft, gravid, appropriate for gestational age.  Pain/Pressure: Present     Pelvic: Cervical exam deferred        Extremities: Normal range of motion.  Edema: None  Mental Status: Normal mood and affect. Normal behavior. Normal judgment and thought content.   Assessment and Plan:  Pregnancy: [id number] at [redacted]w[redacted]d 1. Supervision of other high risk pregnancy, antepartum, unspecified trimester Review abnormal results. Pt has appt with genetics counselor   2.  Antepartum multigravida of advanced maternal age  75. Hx of preeclampsia, prior pregnancy, currently pregnant On baby ASA  4. [redacted] weeks gestation of pregnancy  5. History of preterm delivery 17-OH-P weekly  6. Alpha thalassemia silent carrier - AMB MFM GENETICS REFERRAL  Preterm labor symptoms and general obstetric precautions including but not limited to vaginal bleeding, contractions, leaking of fluid and fetal movement were reviewed in detail with the patient. Please refer to After Visit Summary for other counseling recommendations.   No follow-ups on file.  Future Appointments  Date Time Provider [place]  10/06/2020 10:00 AM [facility] NURSE [facility] None  10/13/2020 10:00 AM [facility] NURSE [facility] None  10/21/2020  9:45 AM Truett Mainland, DO [facility] None  10/28/2020 10:15 AM Teague Bobbye Morton, PA-C [facility]  11/03/2020  9:30 AM [facility] NURSE [facility] [place] [facility]  11/03/2020  9:45 AM [facility] US5 [facility]  11/03/2020 10:30 AM [facility] GENETIC COUNSELING RM [facility]  11/17/2020  9:15 AM Girtha Rm, NP-C PFM-PFM [facility]    Lavonia Drafts, MD

## 2020-10-06 ENCOUNTER — Ambulatory Visit (INDEPENDENT_AMBULATORY_CARE_PROVIDER_SITE_OTHER): Payer: Medicare Other

## 2020-10-06 ENCOUNTER — Other Ambulatory Visit: Payer: Self-pay

## 2020-10-06 VITALS — BP 125/70 | HR 97 | Ht 60.0 in | Wt 176.0 lb

## 2020-10-06 DIAGNOSIS — Z8751 Personal history of pre-term labor: Secondary | ICD-10-CM | POA: Diagnosis not present

## 2020-10-06 NOTE — Progress Notes (Addendum)
Pt presents today for Makena injection. Pt given Makena 275mg  Left arm. Pt tolerated well with no adverse side effects noted. Pt to return in 1 week for repeat injection. Pt still c/o itching in Right arm with previous injection. Pt advised to continue with antihistamine. She does not present with any hives today.  Cydney Ok, CMA  Attestation of Attending Supervision of CMA/RN: Evaluation and management procedures were performed by the nurse under my supervision and collaboration.  I have reviewed the nursing note and chart, and I agree with the management and plan.  Carolyn L. Harraway-Smith, M.D., Cherlynn June

## 2020-10-13 ENCOUNTER — Ambulatory Visit: Payer: Medicare Other

## 2020-10-14 ENCOUNTER — Other Ambulatory Visit: Payer: Self-pay

## 2020-10-14 ENCOUNTER — Ambulatory Visit (INDEPENDENT_AMBULATORY_CARE_PROVIDER_SITE_OTHER): Payer: Medicare Other

## 2020-10-14 DIAGNOSIS — Z8751 Personal history of pre-term labor: Secondary | ICD-10-CM | POA: Diagnosis not present

## 2020-10-14 NOTE — Progress Notes (Signed)
Chart reviewed - agree with CMA/RN documentation.  ° °

## 2020-10-14 NOTE — Progress Notes (Signed)
Patient came for injection of 17P. Patient tolerated injection well. Patient states that last week her injection spot was swollen for a few days but did note that the last person who gave it to her administered it really far in the back of her arm. Kathrene Alu RN

## 2020-10-17 DIAGNOSIS — F3131 Bipolar disorder, current episode depressed, mild: Secondary | ICD-10-CM | POA: Diagnosis not present

## 2020-10-21 ENCOUNTER — Other Ambulatory Visit: Payer: Self-pay

## 2020-10-21 ENCOUNTER — Ambulatory Visit (INDEPENDENT_AMBULATORY_CARE_PROVIDER_SITE_OTHER): Payer: Medicare Other | Admitting: Family Medicine

## 2020-10-21 ENCOUNTER — Institutional Professional Consult (permissible substitution): Payer: Medicare Other | Admitting: Physician Assistant

## 2020-10-21 VITALS — BP 119/69 | HR 94 | Wt 176.0 lb

## 2020-10-21 DIAGNOSIS — Z3A24 24 weeks gestation of pregnancy: Secondary | ICD-10-CM

## 2020-10-21 DIAGNOSIS — O09899 Supervision of other high risk pregnancies, unspecified trimester: Secondary | ICD-10-CM

## 2020-10-21 DIAGNOSIS — O09292 Supervision of pregnancy with other poor reproductive or obstetric history, second trimester: Secondary | ICD-10-CM

## 2020-10-21 DIAGNOSIS — Z8751 Personal history of pre-term labor: Secondary | ICD-10-CM | POA: Diagnosis not present

## 2020-10-21 DIAGNOSIS — O09522 Supervision of elderly multigravida, second trimester: Secondary | ICD-10-CM

## 2020-10-21 DIAGNOSIS — O09529 Supervision of elderly multigravida, unspecified trimester: Secondary | ICD-10-CM

## 2020-10-21 DIAGNOSIS — O09892 Supervision of other high risk pregnancies, second trimester: Secondary | ICD-10-CM

## 2020-10-21 DIAGNOSIS — R7303 Prediabetes: Secondary | ICD-10-CM

## 2020-10-21 DIAGNOSIS — O09299 Supervision of pregnancy with other poor reproductive or obstetric history, unspecified trimester: Secondary | ICD-10-CM

## 2020-10-21 NOTE — Progress Notes (Signed)
   PRENATAL VISIT NOTE  Subjective:  Tiffany Juarez is a 40 y.o. [id number] at [redacted]w[redacted]d being seen today for ongoing prenatal care.  She is currently monitored for the following issues for this high-risk pregnancy and has Paresthesia; Pain of both hip joints; Bipolar disorder ([place]); Sleep walking; Alteration consciousness; Other chronic pain; Prediabetes; Vitamin D deficiency; Smoker; Obesity (BMI 30-39.9); Pure hypercholesterolemia; Dry skin; Night sweats; Supervision of other high risk pregnancy, antepartum, unspecified trimester; AMA (advanced maternal age) multigravida 35+; Hx of preeclampsia, prior pregnancy, currently pregnant; History of preterm delivery; and Fibroid on their problem list.  Patient reports no complaints.  Contractions: Not present. Vag. Bleeding: None.  Movement: Present. Denies leaking of fluid.   The following portions of the patient's history were reviewed and updated as appropriate: allergies, current medications, past family history, past medical history, past social history, past surgical history and problem list.   Objective:   Vitals:   10/21/20 1003  BP: 119/69  Pulse: 94  Weight: 176 lb (79.8 kg)    Fetal Status: Fetal Heart Rate (bpm): 145   Movement: Present     General:  Alert, oriented and cooperative. Patient is in no acute distress.  Skin: Skin is warm and dry. No rash noted.   Cardiovascular: Normal heart rate noted  Respiratory: Normal respiratory effort, no problems with respiration noted  Abdomen: Soft, gravid, appropriate for gestational age.  Pain/Pressure: Present     Pelvic: Cervical exam deferred        Extremities: Normal range of motion.  Edema: None  Mental Status: Normal mood and affect. Normal behavior. Normal judgment and thought content.   Assessment and Plan:  Pregnancy: [id number] at [redacted]w[redacted]d 1. [redacted] weeks gestation of pregnancy  2. Supervision of other high risk pregnancy, antepartum, unspecified trimester FHT and FH normal  3. Antepartum  multigravida of advanced maternal age On ASA 81mg  Serial [place]  4. Hx of preeclampsia, prior pregnancy, currently pregnant BP normal  5. History of preterm delivery On Makena  6. Prediabetes Normal early 2hr GTT  Preterm labor symptoms and general obstetric precautions including but not limited to vaginal bleeding, contractions, leaking of fluid and fetal movement were reviewed in detail with the patient. Please refer to After Visit Summary for other counseling recommendations.   No follow-ups on file.  Future Appointments  Date Time Provider [place]  10/28/2020 10:15 AM Teague Darrol Angel [facility]  11/03/2020  9:30 AM [facility] NURSE [facility]  11/03/2020  9:45 AM [facility] US5 [facility]  11/03/2020 10:30 AM [facility] GENETIC COUNSELING RM [facility]  11/17/2020  9:15 AM Girtha Rm, NP-C PFM-PFM [facility]  11/18/2020  9:00 AM Truett Mainland, DO C[facility] None    Truett Mainland, DO

## 2020-10-25 ENCOUNTER — Telehealth: Payer: Self-pay

## 2020-10-25 NOTE — Telephone Encounter (Signed)
Regan from [facility] called to confirm delivery for Tiffany Juarez's Makena. Regan states Darol [employer] auto injectors will be delivered to the office on 11/01/20.  Khaalid Lefkowitz l Avamarie Crossley, CMA

## 2020-10-27 ENCOUNTER — Ambulatory Visit: Payer: Medicare Other

## 2020-10-28 ENCOUNTER — Ambulatory Visit (INDEPENDENT_AMBULATORY_CARE_PROVIDER_SITE_OTHER): Payer: Medicare Other

## 2020-10-28 ENCOUNTER — Other Ambulatory Visit: Payer: Self-pay

## 2020-10-28 ENCOUNTER — Institutional Professional Consult (permissible substitution): Payer: Medicare Other | Admitting: Physician Assistant

## 2020-10-28 VITALS — Wt 175.0 lb

## 2020-10-28 DIAGNOSIS — Z3A25 25 weeks gestation of pregnancy: Secondary | ICD-10-CM

## 2020-10-28 DIAGNOSIS — Z8751 Personal history of pre-term labor: Secondary | ICD-10-CM

## 2020-10-28 NOTE — Progress Notes (Signed)
Patient was assessed and managed by nursing staff during this encounter. I have reviewed the chart and agree with the documentation and plan. I have also made any necessary editorial changes.  Mora Bellman, MD 10/28/2020 11:42 AM

## 2020-10-28 NOTE — Progress Notes (Signed)
Tiffany Juarez here for 17-P  Injection.  Injection administered without complication. Patient will return in one week for next injection.  Tiffany Juarez l Tiffany Juarez, CMA 10/28/2020  8:51 AM

## 2020-10-31 ENCOUNTER — Other Ambulatory Visit: Payer: Self-pay

## 2020-10-31 ENCOUNTER — Ambulatory Visit: Payer: Medicare Other | Admitting: *Deleted

## 2020-10-31 ENCOUNTER — Ambulatory Visit: Payer: Medicare Other

## 2020-10-31 ENCOUNTER — Encounter: Payer: Self-pay | Admitting: *Deleted

## 2020-10-31 ENCOUNTER — Ambulatory Visit: Payer: Medicare Other | Attending: Obstetrics and Gynecology

## 2020-10-31 DIAGNOSIS — O09529 Supervision of elderly multigravida, unspecified trimester: Secondary | ICD-10-CM | POA: Insufficient documentation

## 2020-10-31 DIAGNOSIS — O3412 Maternal care for benign tumor of corpus uteri, second trimester: Secondary | ICD-10-CM | POA: Diagnosis not present

## 2020-10-31 DIAGNOSIS — O09299 Supervision of pregnancy with other poor reproductive or obstetric history, unspecified trimester: Secondary | ICD-10-CM | POA: Insufficient documentation

## 2020-10-31 DIAGNOSIS — E669 Obesity, unspecified: Secondary | ICD-10-CM

## 2020-10-31 DIAGNOSIS — O321XX0 Maternal care for breech presentation, not applicable or unspecified: Secondary | ICD-10-CM

## 2020-10-31 DIAGNOSIS — O09899 Supervision of other high risk pregnancies, unspecified trimester: Secondary | ICD-10-CM | POA: Insufficient documentation

## 2020-10-31 DIAGNOSIS — Z3A26 26 weeks gestation of pregnancy: Secondary | ICD-10-CM

## 2020-10-31 DIAGNOSIS — D219 Benign neoplasm of connective and other soft tissue, unspecified: Secondary | ICD-10-CM | POA: Diagnosis not present

## 2020-10-31 DIAGNOSIS — O99212 Obesity complicating pregnancy, second trimester: Secondary | ICD-10-CM

## 2020-10-31 DIAGNOSIS — O34219 Maternal care for unspecified type scar from previous cesarean delivery: Secondary | ICD-10-CM

## 2020-10-31 DIAGNOSIS — O09522 Supervision of elderly multigravida, second trimester: Secondary | ICD-10-CM

## 2020-11-01 ENCOUNTER — Other Ambulatory Visit: Payer: Self-pay | Admitting: *Deleted

## 2020-11-01 DIAGNOSIS — Z6839 Body mass index (BMI) 39.0-39.9, adult: Secondary | ICD-10-CM

## 2020-11-01 DIAGNOSIS — F3131 Bipolar disorder, current episode depressed, mild: Secondary | ICD-10-CM | POA: Diagnosis not present

## 2020-11-03 ENCOUNTER — Ambulatory Visit: Payer: Medicare Other

## 2020-11-03 ENCOUNTER — Ambulatory Visit (INDEPENDENT_AMBULATORY_CARE_PROVIDER_SITE_OTHER): Payer: Medicare Other

## 2020-11-03 ENCOUNTER — Other Ambulatory Visit: Payer: Self-pay

## 2020-11-03 DIAGNOSIS — Z8751 Personal history of pre-term labor: Secondary | ICD-10-CM | POA: Diagnosis not present

## 2020-11-03 NOTE — Progress Notes (Signed)
Chart reviewed - agree with CMA/RN documentation.  ° °

## 2020-11-03 NOTE — Progress Notes (Signed)
Patient presents for [facility] injection. Patient tolerated injection well. Kathrene Alu RN

## 2020-11-04 ENCOUNTER — Ambulatory Visit: Payer: Medicare Other

## 2020-11-09 ENCOUNTER — Ambulatory Visit: Payer: Medicare Other

## 2020-11-10 ENCOUNTER — Ambulatory Visit: Payer: Medicare Other

## 2020-11-10 ENCOUNTER — Other Ambulatory Visit: Payer: Self-pay

## 2020-11-10 ENCOUNTER — Ambulatory Visit: Payer: Medicare Other | Attending: Obstetrics and Gynecology | Admitting: Genetic Counselor

## 2020-11-10 DIAGNOSIS — Z31430 Encounter of female for testing for genetic disease carrier status for procreative management: Secondary | ICD-10-CM | POA: Diagnosis not present

## 2020-11-10 DIAGNOSIS — Z315 Encounter for genetic counseling: Secondary | ICD-10-CM

## 2020-11-10 DIAGNOSIS — D563 Thalassemia minor: Secondary | ICD-10-CM | POA: Diagnosis not present

## 2020-11-10 NOTE — Progress Notes (Signed)
11/10/2020  Zandrea Kenealy 05/28/1981 MRN: [id number] DOV: 11/10/2020   Patient location: home Provider location: [facility] office Persons involved in the visit: patient, provider  I connected with Ms. Marvin on 11/10/2020 at 1 PM EST by WebEx and verified that I am speaking with the correct person using two identifiers. Ms. Kingsbury was referred to the [facility] for a genetics consultation regarding her carrier status for alpha-thalassemia and spinal muscular atrophy. Ms. Qualley presented to her appointment alone.   Indication for genetic counseling - Silent carrier for alpha-thalassemia - Increased risk to be silent (2+0) carrier for spinal muscular atrophy  Prenatal history  Ms. Kelm is a [id number], 40 y.o. female. Her current pregnancy has completed [redacted]w[redacted]d (Estimated Date of Delivery: 02/06/21). Ms. Febres has a 50 year old son, a 52 year old daughter, and a 42 year old son from prior relationships. She has also had one miscarriage around 4-6 weeks' gestation and an elective termination.  Ms. Liese denied exposure to environmental toxins or chemical agents. She denied the use of alcohol or street drugs. She reported smoking 2-5 cigarettes per day, down from a pack and a half per day. She reported taking prenatal vitamins, aspirin, Claritin, and nausea medications. She denied significant viral illnesses, fevers, and bleeding during the course of her pregnancy. She reported a personal history of fibromyalgia and bipolar disorder. She has had a prior Cesarean section. Her medical and surgical histories were otherwise noncontributory.  Prenatal exposure to tobacco can increase the risk for placenta previa and placental abruption, preterm birth, low birth weight, oral clefts, stillbirth, and sudden infant death syndrome (SIDS). Prenatal exposure to nicotine is also associated with an increased likelihood of neurological disorder and asthma. The  risk of pregnancy complications associated with cigarette exposure tends to increase with the amount of cigarettes a woman smokes. I commended Ms. Nation on cutting back on smoking and encouraged her to continue reducing the number of cigarettes smoked per day with the goal to eventually quit smoking altogether.   Family History  A three generation pedigree was drafted and reviewed. The family history is remarkable for the following:  - Both of Ms. Burright's sons were born premature and had motor/speech delays. Her youngest son also has autism. We discussed that both developmental delays and autism can be associated with prematurity.   - Ms. Xin's brother was born with extra fingers. Polydactyly is a form of a congenital birth defect. In every pregnancy, a woman starts out with a 3-5% chance of having a baby with any birth defect. However, isolated polydactyly in particular can appear to run in families. Thus, there is an increased chance that Ms. Partington's children could also have polydactyly.  - Ms. Peeters has a maternal half sister who had ovarian cancer diagnosed in her 77s. Ms. Langhorne partner [name] also had a sarcoid tumor diagnosed in his 60s. We discussed that although most cancers are thought to be sporadic or due to environmental factors, some families appear to have a strong predisposition to cancers. When considering a family history of cancer, we look for common types of cancer in multiple family members occurring at younger than typical ages. We discussed the option of meeting with a cancer genetic counselor to discuss any possible screening or testing options available. If Ms. Leven or her partner are concerned about their family histories of cancer and would like to learn more about their families' chances for an inherited cancer  syndrome, they or their healthcare provider may refer them to the [facility] [phone]).   The remaining family histories were reviewed and  found to be noncontributory for birth defects, intellectual disability, recurrent pregnancy loss, and known genetic conditions.    The patient's ancestry is [place], [place], and [place]. The father of the pregnancy's ancestry is African American and Caucasian. Ashkenazi Jewish ancestry and consanguinity were denied. Pedigree will be scanned under Media.  Discussion  Ms. Aure was referred for genetic counseling as she had Horizon-4 carrier screening that identified her as a silent carrier for alpha-thalassemia and a possible silent carrier for spinal muscular atrophy.  Alpha-thalassemia:  Ms. Blansett had Horizon-4 carrier screening performed through [place]. The results of the screen identified her as a silent carrier for alpha-thalassemia (aa/a-). Alpha-thalassemia is different in its inheritance compared to other hemoglobinopathies as there are two copies of two alpha globin genes (HBA1 and HBA2) on each chromosome 16, or four alpha globin genes total (aa/aa). A person can be a carrier of one alpha gene mutation (aa/a-), also referred to as a "silent carrier". A person who carries two alpha globin gene mutations can either carry them in cis (both on the same chromosome, denoted as aa/--) or in trans (on different chromosomes, denoted as a-/a-). Alpha-thalassemia carriers of two mutations who have African American ancestry are more likely to have a trans arrangement (a-/a-); cis configuration is reported to be rare in individuals with African American ancestry.     There are several different forms of alpha-thalassemia. The most severe form of alpha-thalassemia, Hb Barts, is associated with an absence of alpha globin chain synthesis as a result of deletions of all four alpha globin genes (--/--).  Given that Ms. Shugart is a silent carrier (aa/a-), her pregnancies would not be at increased risk for Hb Barts, even if her partner is a carrier for alpha-thalassemia, as she will always pass on at  least one copy of the alpha globin gene to her children. Hemoglobin H (HbH) disease is caused by three deleted or dysfunctioning alpha globin alleles (a-/--) and is characterized by microcytic hypochromic hemolytic anemia, hepatosplenomegaly, mild jaundice, growth retardation, and sometimes thalassemia-like bone changes. Given Ms. Kreger's silent carrier status (aa/a-), the current fetus would only be at risk for HbH disease (a-/--), if her partner is a carrier for two alpha globin mutations in cis (aa/--). If this is the case, the risk for HbH disease in the pregnancy would be 1 in 4 (25%). However, if Ms. Foskett's partner is a carrier for two alpha globin mutations, he would be more likely to carry them in trans configuration (a-/a-) than the cis configuration (aa/--), given his ethnicity. If he is a carrier of alpha-thalassemia in trans, then the pregnancy would not be at increased risk for HbH disease. Since Ms. Conly's partner has African American ancestry, he has a 1 in 66 chance of being any type of carrier for alpha-thalassemia. Without partner carrier screening to refine risk, the couple has a <1% chance of having a child with HbH disease.  Spinal muscular atrophy:  Ms. Madruga was also found to have 2 copies of the SMN1 gene on Horizon-4 carrier screening; however, she also has the c.*3+80T>G polymorphism of SMN1 in intron 7 (also known as [id number]). This puts her at increased risk (1 in 34, or 3%) to be a silent 2+0 carrier for SMA. This means that there is a 97% chance that she is not a carrier for SMA.  We reviewed that SMA is a condition caused by mutations in the SMN1 gene. Typically, individuals have two copies of the SMN1 gene, with one copy present on each chromosome. In SMA silent carriers, both copies of the SMN1 gene are present on one chromosome, with no copies of SMN1 present on the other chromosome.   SMA is characterized by progressive muscle weakness and atrophy due to  degeneration and loss of anterior horn cells (lower motor neurons) in the spinal cord and brain stem. We discussed the different types of SMA (0, I, II, and III), including differences in severity and age of onset. We also reviewed the autosomal recessive inheritance pattern of SMA. We discussed that the couple only has a chance of having a child with SMA if both parents are identified as carriers for the condition. Based on the pan-ethnic carrier frequency for SMA, Ms. Blas's partner currently has a 1 in 42 chance of being a carrier of SMA. Without partner screening to refine risk, the couple currently has a 1 in 6664 (0.02%) risk of having a child with SMA. If Ms. Rewis's partner were found to have 2 copies of SMN1, his risk of being a carrier would be reduced but not eliminated. If both Ms. Giacobbe and her partner are carriers of SMA, they would have a 1 in 4 (25%) chance of having an affected fetus with each of their pregnancies.   Other carrier screening results:  Ms. Jankowiak carrier screening was negative for the other 2 conditions screened. Thus, her risk to be a carrier for these additional conditions (listed separately in the laboratory report) has been reduced but not eliminated. This also significantly reduces her risk of having a child affected by one of these conditions. We discussed that carrier testing for alpha-thalassemia and SMA is recommended for Ms. Wesely's partner. Ms. Raffety indicated that she is not interested in pursuing partner carrier screening. She was informed that SMA, but not alpha-thalassemia, is included on [place]'s newborn screen.  Aneuploidy screening results:  We also reviewed that Ms. Gayden had [facility] noninvasive prenatal screening (NIPS) through the laboratory [facility] that was low-risk for fetal aneuploidies. We reviewed that these results showed a less than 1 in 10,000 risk for trisomies 21, 18 and 13, and monosomy X (Turner syndrome). In addition, the risk  for triploidy and sex chromosome trisomies (47,XXX and 47,XXY) was also low. Ms. Mikulski elected to have cfDNA analysis for 22q11.2 deletion syndrome, which was also low risk (1 in 3100). We reviewed that while this testing identifies 94-99% of pregnancies with trisomy 11, trisomy 70, trisomy 15, and >70% of cases of sex chromosome aneuploidies, it is NOT diagnostic. A positive test result requires confirmation by CVS or amniocentesis, and a negative test result does not rule out a fetal chromosome abnormality. She also understands that this testing does not identify all genetic conditions.  Advanced paternal age:  Given that Ms. Glennon's partner is 75 years old, we also reviewed considerations associated with advanced paternal age. An individual is considered to be of advanced paternal age (APA) if they are over the age of 28 at time of conception. The chance of de novo (new) single gene mutations in an individual increases with paternal age. For this reason, autosomal dominant single gene disorders are most strongly associated with APA. Fetuses of APA fathers have a 0.3-1% overall chance of being affected by a single gene disorder. We also discussed that APA may also be associated with other conditions, such as  autism.   We discussed an available screening option for genetic conditions associated with APA called [facility]. [facility] utilizes noninvasive prenatal screening (NIPS) to screen a pregnancy for 25 autosomal or X-linked dominant single gene disorders. These include conditions associated with APA, such as Noonan syndrome, skeletal dysplasias, and craniosynostoses. [facility] is NOT diagnostic, and a positive result requires confirmation by CVS or amniocentesis. A negative test result does not rule out all single gene disorders. [facility] also cannot screen for autism. Ms. Lessig was not interested in pursuing [facility] at this time.  Diagnostic testing:  Ms. Bollig was also counseled regarding diagnostic  testing via amniocentesis. We discussed the technical aspects of the procedure and quoted up to a 1 in 500 (0.2%) risk for preterm labor or other adverse pregnancy outcomes as a result of amniocentesis. Cultured cells from an amniocentesis sample allow for the visualization of a fetal karyotype, which can detect >99% of large chromosomal aberrations. Chromosomal microarray can also be performed to identify smaller deletions or duplications of fetal chromosomal material. Amniocentesis could also be performed to assess whether the baby is affected by alpha-thalassemia or SMA. After careful consideration, Ms. Boxell declined amniocentesis at this time. She understands that amniocentesis is available at any point after 16 weeks of pregnancy and that she may opt to undergo the procedure at a later date should she change her mind.  Plan:  Ms. Wrisley declined partner carrier screening for alpha-thalassemia and SMA as well as all further screening/testing. She felt comfortable with her chances of having a child with HbH disease or SMA based on her partner's ethnicity-based risk estimates. She understands that screening tests, including ultrasound, cannot rule out all birth defects or genetic syndromes.   I counseled Ms. Allman regarding the above risks and available options. The approximate time with the genetic counselor was 40 minutes.  In summary:  Discussed carrier screening results and options for follow-up testing  Silent carrier for alpha-thalassemia  Increased risk (1 in 34, ~3%) chance to be silent carrier for SMA  Declined partner carrier screening  Reviewed low-risk NIPS result  Reduction in risk for Down syndrome, trisomy 67, trisomy 64, triploidy, sex chromosome aneuploidies, and 22q11.2 deletion syndrome  Offered additional testing and screening  Declined [facility] single gene NIPS for advanced paternal age  Declined amniocentesis  Reviewed family history concerns   Buelah Manis,  MS, Counselling psychologist

## 2020-11-15 ENCOUNTER — Ambulatory Visit: Payer: Self-pay

## 2020-11-16 NOTE — Patient Instructions (Addendum)
Good luck with the rest of your pregnancy.  We are not checking labs today since you are being followed by you OB and will be seen there tomorrow.   I recommend that you stop smoking completely.   Call and schedule with an eye doctor and dentist.   Continue using a good moisturizer and you should use this more than once daily.  Take pictures when your skin is flared up.    Preventive Care 17-39 Years Old, Female Preventive care refers to lifestyle choices and visits with your health care provider that can promote health and wellness. This includes:  A yearly physical exam. This is also called an annual wellness visit.  Regular dental and eye exams.  Immunizations.  Screening for certain conditions.  Healthy lifestyle choices, such as: ? Eating a healthy diet. ? Getting regular exercise. ? Not using drugs or products that contain nicotine and tobacco. ? Limiting alcohol use. What can I expect for my preventive care visit? Physical exam Your health care provider may check your:  Height and weight. These may be used to calculate your BMI (body mass index). BMI is a measurement that tells if you are at a healthy weight.  Heart rate and blood pressure.  Body temperature.  Skin for abnormal spots. Counseling Your health care provider may ask you questions about your:  Past medical problems.  Family's medical history.  Alcohol, tobacco, and drug use.  Emotional well-being.  Home life and relationship well-being.  Sexual activity.  Diet, exercise, and sleep habits.  Work and work Statistician.  Access to firearms.  Method of birth control.  Menstrual cycle.  Pregnancy history. What immunizations do I need? Vaccines are usually given at various ages, according to a schedule. Your health care provider will recommend vaccines for you based on your age, medical history, and lifestyle or other factors, such as travel or where you work.   What tests do I  need? Blood tests  Lipid and cholesterol levels. These may be checked every 5 years starting at age 47.  Hepatitis C test.  Hepatitis B test. Screening  Diabetes screening. This is done by checking your blood sugar (glucose) after you have not eaten for a while (fasting).  STD (sexually transmitted disease) testing, if you are at risk.  BRCA-related cancer screening. This may be done if you have a family history of breast, ovarian, tubal, or peritoneal cancers.  Pelvic exam and Pap test. This may be done every 3 years starting at age 65. Starting at age 67, this may be done every 5 years if you have a Pap test in combination with an HPV test. Talk with your health care provider about your test results, treatment options, and if necessary, the need for more tests.   Follow these instructions at home: Eating and drinking  Eat a healthy diet that includes fresh fruits and vegetables, whole grains, lean protein, and low-fat dairy products.  Take vitamin and mineral supplements as recommended by your health care provider.  Do not drink alcohol if: ? Your health care provider tells you not to drink. ? You are pregnant, may be pregnant, or are planning to become pregnant.  If you drink alcohol: ? Limit how much you have to 0-1 drink a day. ? Be aware of how much alcohol is in your drink. In the [place] one drink equals one 12 oz bottle of beer (355 mL), one 5 oz glass of wine (148 mL), or one 1 oz glass of  hard liquor (44 mL).   Lifestyle  Take daily care of your teeth and gums. Brush your teeth every morning and night with fluoride toothpaste. Floss one time each day.  Stay active. Exercise for at least 30 minutes 5 or more days each week.  Do not use any products that contain nicotine or tobacco, such as cigarettes, e-cigarettes, and chewing tobacco. If you need help quitting, ask your health care provider.  Do not use drugs.  If you are sexually active, practice safe sex. Use a  condom or other form of protection to prevent STIs (sexually transmitted infections).  If you do not wish to become pregnant, use a form of birth control. If you plan to become pregnant, see your health care provider for a prepregnancy visit.  Find healthy ways to cope with stress, such as: ? Meditation, yoga, or listening to music. ? Journaling. ? Talking to a trusted person. ? Spending time with friends and family. Safety  Always wear your seat belt while driving or riding in a vehicle.  Do not drive: ? If you have been drinking alcohol. Do not ride with someone who has been drinking. ? When you are tired or distracted. ? While texting.  Wear a helmet and other protective equipment during sports activities.  If you have firearms in your house, make sure you follow all gun safety procedures.  Seek help if you have been physically or sexually abused. What's next?  Go to your health care provider once a year for an annual wellness visit.  Ask your health care provider how often you should have your eyes and teeth checked.  Stay up to date on all vaccines. This information is not intended to replace advice given to you by your health care provider. Make sure you discuss any questions you have with your health care provider. Document Revised: 03/06/2020 Document Reviewed: 03/20/2018 Elsevier Patient Education  2021 [employer].

## 2020-11-16 NOTE — Progress Notes (Signed)
Subjective:    Patient ID: Tiffany Juarez, female    DOB: 1981/03/11, 40 y.o.   MRN: [id number]  HPI Chief Complaint  Patient presents with  . nonfasting cpe    Nonfasting cpe, sees obgyn-    She is here for a complete physical exam.   Other providers: Dr. Nehemiah Settle in [place] is her OB/GYN  Psychiatrist- Dr Casimiro Needle Counselor   She is pregnant and due in July 2022.  Has appt tomorrow with OB  Dry skin that occasionally blisters on her hands and peels. Ongoing for many years and has gotten worse over the past 2 years.    Social history: Lives with fiance and is expecting a baby in July. She has 3 older children, is not working. States she is still smoking but has cut back.    Diet: just getting her appetite back  Excerise: nothing   Immunizations: reviewed   Health maintenance:  Mammogram: N/A Colonoscopy: N/A Last Dental Exam: 7 years ago  Last Eye Exam: years ago   Wears seatbelt always, smoke detectors in home and functioning, does not text while driving and feels safe in home environment.   Reviewed allergies, medications, past medical, surgical, family, and social history.     Review of Systems Review of Systems Constitutional: -fever, -chills, -sweats, -unexpected weight change,-fatigue ENT: -runny nose, -ear pain, -sore throat Cardiology:  -chest pain, -palpitations, -edema Respiratory: -cough, -shortness of breath, -wheezing Gastroenterology: -abdominal pain, -nausea, -vomiting, -diarrhea, -constipation  Hematology: -bleeding or bruising problems Musculoskeletal: -arthralgias, -myalgias, -joint swelling, -back pain Ophthalmology: -vision changes Urology: -dysuria, -difficulty urinating, -hematuria, -urinary frequency, -urgency Neurology: -headache, -weakness, -tingling, -numbness       Objective:   Physical Exam BP 128/78   Pulse 99   Ht 5\' 1"  (1.549 m)   Wt 176 lb (79.8 kg)   LMP 03/27/2020   BMI 33.25 kg/m   General Appearance:    Alert,  cooperative, no distress, appears stated age  Head:    Normocephalic, without obvious abnormality, atraumatic  Eyes:    PERRL, conjunctiva/corneas clear, EOM's intact  Ears:    Normal TM's and external ear canals  Nose:   Mask on   Throat:   Mask on   Neck:   Supple, no lymphadenopathy;  thyroid:  no   enlargement/tenderness/nodules; no JVD  Back:    Spine nontender, no curvature, ROM normal, no CVA     tenderness  Lungs:     Clear to auscultation bilaterally without wheezes, rales or     ronchi; respirations unlabored  Chest Wall:    No tenderness or deformity   Heart:    Regular rate and rhythm, S1 and S2 normal, no murmur, rub   or gallop  Breast Exam:    OB/GYN   Abdomen:     Not done   Genitalia:    OB/GYN   Rectal:    Not performed due to age<40 and no related complaints  Extremities:   No clubbing, cyanosis or edema  Pulses:   2+ and symmetric all extremities  Skin:   Skin color, texture, turgor normal, no rashes or lesions  Lymph nodes:   Cervical, supraclavicular, and axillary nodes normal  Neurologic:   CNII-XII intact, normal strength, sensation and gait          Psych:   Normal mood, affect, hygiene and grooming.         Assessment & Plan:  Routine general medical examination at a health care facility -Preventive health  care reviewed.  She is seeing the OB regularly since she is now pregnant and due in July.  Counseling on healthy lifestyle.  She is currently off of her medications for her mood but is still seeing her counselor and psychiatrist.  She will start back on her medications as soon as she delivers her child.  I recommend scheduling dental and eye exam since she is overdue on both.  Discussed safety and health promotion.  Immunizations reviewed. Declines labs here today.  Reports she will have blood work done tomorrow along with a glucose challenge at her OB/GYN  Eczema, unspecified type -Discussed that sometimes eczema can get worse during pregnancy.  Recommend  using a good moisturizer more than once daily and only a mild soap such as Dove sensitive.  Encouraged her to take pictures since today her eczema has not flared up.  She may let me know if she would like to see a dermatologist if this worsens.  [redacted] weeks gestation of pregnancy -Continue seeing her OB for regular visits.  She has appointment tomorrow.  Hx of preeclampsia, prior pregnancy, currently pregnant Blood pressure controlled  Smoker -Encouraged her especially since she is pregnant  Prediabetes -Reports having a glucose challenge scheduled for tomorrow with her OB/GYN appointment

## 2020-11-17 ENCOUNTER — Ambulatory Visit (INDEPENDENT_AMBULATORY_CARE_PROVIDER_SITE_OTHER): Payer: Medicare Other | Admitting: Family Medicine

## 2020-11-17 ENCOUNTER — Other Ambulatory Visit: Payer: Self-pay

## 2020-11-17 ENCOUNTER — Encounter: Payer: Self-pay | Admitting: Family Medicine

## 2020-11-17 VITALS — BP 128/78 | HR 99 | Ht 61.0 in | Wt 176.0 lb

## 2020-11-17 DIAGNOSIS — L309 Dermatitis, unspecified: Secondary | ICD-10-CM

## 2020-11-17 DIAGNOSIS — R7303 Prediabetes: Secondary | ICD-10-CM

## 2020-11-17 DIAGNOSIS — Z0000 Encounter for general adult medical examination without abnormal findings: Secondary | ICD-10-CM

## 2020-11-17 DIAGNOSIS — Z3A28 28 weeks gestation of pregnancy: Secondary | ICD-10-CM

## 2020-11-17 DIAGNOSIS — F17200 Nicotine dependence, unspecified, uncomplicated: Secondary | ICD-10-CM | POA: Diagnosis not present

## 2020-11-17 DIAGNOSIS — O09299 Supervision of pregnancy with other poor reproductive or obstetric history, unspecified trimester: Secondary | ICD-10-CM

## 2020-11-18 ENCOUNTER — Ambulatory Visit (INDEPENDENT_AMBULATORY_CARE_PROVIDER_SITE_OTHER): Payer: Medicare Other | Admitting: Family Medicine

## 2020-11-18 VITALS — BP 116/72 | HR 97 | Wt 176.0 lb

## 2020-11-18 DIAGNOSIS — Z8751 Personal history of pre-term labor: Secondary | ICD-10-CM

## 2020-11-18 DIAGNOSIS — O09299 Supervision of pregnancy with other poor reproductive or obstetric history, unspecified trimester: Secondary | ICD-10-CM

## 2020-11-18 DIAGNOSIS — O09899 Supervision of other high risk pregnancies, unspecified trimester: Secondary | ICD-10-CM

## 2020-11-18 DIAGNOSIS — O09529 Supervision of elderly multigravida, unspecified trimester: Secondary | ICD-10-CM

## 2020-11-18 DIAGNOSIS — Z3A28 28 weeks gestation of pregnancy: Secondary | ICD-10-CM | POA: Diagnosis not present

## 2020-11-18 DIAGNOSIS — R7303 Prediabetes: Secondary | ICD-10-CM

## 2020-11-18 MED ORDER — COMFORT FIT MATERNITY SUPP MED MISC: 1.0000 [IU] | 0 refills | Status: DC | PRN

## 2020-11-18 NOTE — Progress Notes (Signed)
   PRENATAL VISIT NOTE  Subjective:  Tiffany Juarez is a 40 y.o. [id number] at [redacted]w[redacted]d being seen today for ongoing prenatal care.  She is currently monitored for the following issues for this high-risk pregnancy and has Paresthesia; Pain of both hip joints; Bipolar disorder ([place]); Sleep walking; Alteration consciousness; Other chronic pain; Prediabetes; Vitamin D deficiency; Smoker; Obesity (BMI 30-39.9); Pure hypercholesterolemia; Dry skin; Night sweats; Supervision of other high risk pregnancy, antepartum, unspecified trimester; AMA (advanced maternal age) multigravida 35+; Hx of preeclampsia, prior pregnancy, currently pregnant; History of preterm delivery; and Fibroid on their problem list.  Patient reports abominal cramping/pressure. Some pain with fibroid.  Contractions: Not present. Vag. Bleeding: None.  Movement: Present. Denies leaking of fluid.   The following portions of the patient's history were reviewed and updated as appropriate: allergies, current medications, past family history, past medical history, past social history, past surgical history and problem list.   Objective:   Vitals:   11/18/20 0833  BP: 116/72  Pulse: 97  Weight: 176 lb (79.8 kg)    Fetal Status: Fetal Heart Rate (bpm): 144   Movement: Present     General:  Alert, oriented and cooperative. Patient is in no acute distress.  Skin: Skin is warm and dry. No rash noted.   Cardiovascular: Normal heart rate noted  Respiratory: Normal respiratory effort, no problems with respiration noted  Abdomen: Soft, gravid, appropriate for gestational age.  Pain/Pressure: Present     Pelvic: Cervical exam deferred        Extremities: Normal range of motion.  Edema: None  Mental Status: Normal mood and affect. Normal behavior. Normal judgment and thought content.   Assessment and Plan:  Pregnancy: [id number] at [redacted]w[redacted]d 1. [redacted] weeks gestation of pregnancy  2. Supervision of other high risk pregnancy, antepartum, unspecified  trimester FHT and FH normal. Adbominal support band (maternity belt) prescribed.  3. Hx of preeclampsia, prior pregnancy, currently pregnant ASA 81mg  BP normal  4. Antepartum multigravida of advanced maternal age  59. History of preterm delivery Makena  6. Prediabetes Normal early 2hr. Will return on Monday for 2hr GTT.  Preterm labor symptoms and general obstetric precautions including but not limited to vaginal bleeding, contractions, leaking of fluid and fetal movement were reviewed in detail with the patient. Please refer to After Visit Summary for other counseling recommendations.   Return in about 2 weeks (around 12/02/2020) for OB f/u.  Future Appointments  Date Time Provider [place]  11/21/2020  8:30 AM [facility] NURSE [facility] None  11/25/2020  8:55 AM [facility] NURSE [facility] None  11/29/2020  8:45 AM [facility] NURSE [facility]  11/29/2020  9:00 AM [facility] US1 [facility]  12/01/2020  8:45 AM Quinnton Bury, Tanna Savoy, DO [facility] None  12/09/2020  8:55 AM [facility] NURSE [facility] None  12/16/2020  9:30 AM Lavonia Drafts, MD [facility] None  12/23/2020  8:55 AM [facility] NURSE [facility] None  12/29/2020  9:00 AM Truett Mainland, DO [facility] None  01/13/2021  8:45 AM Truett Mainland, DO [facility] None  01/19/2021  9:15 AM Truett Mainland, DO [facility] None  11/20/2021  9:30 AM Girtha Rm, NP-C PFM-PFM [place], DO

## 2020-11-21 ENCOUNTER — Other Ambulatory Visit: Payer: Self-pay

## 2020-11-21 ENCOUNTER — Other Ambulatory Visit: Payer: Medicare Other

## 2020-11-21 DIAGNOSIS — Z3A29 29 weeks gestation of pregnancy: Secondary | ICD-10-CM | POA: Diagnosis not present

## 2020-11-21 NOTE — Progress Notes (Signed)
Patient sent to lab for 28 week labs to be drawn. Kathrene Alu RN

## 2020-11-22 LAB — RPR: RPR Ser Ql: NONREACTIVE

## 2020-11-22 LAB — CBC
Hematocrit: 33 % — ABNORMAL LOW (ref 34.0–46.6)
Hemoglobin: 10.7 g/dL — ABNORMAL LOW (ref 11.1–15.9)
MCH: 27.1 pg (ref 26.6–33.0)
MCHC: 32.4 g/dL (ref 31.5–35.7)
MCV: 84 fL (ref 79–97)
Platelets: 183 10*3/uL (ref 150–450)
RBC: 3.95 x10E6/uL (ref 3.77–5.28)
RDW: 14.5 % (ref 11.7–15.4)
WBC: 10.6 10*3/uL (ref 3.4–10.8)

## 2020-11-22 LAB — GLUCOSE TOLERANCE, 2 HOURS W/ 1HR
Glucose, 1 hour: 143 mg/dL (ref 65–179)
Glucose, 2 hour: 136 mg/dL (ref 65–152)
Glucose, Fasting: 88 mg/dL (ref 65–91)

## 2020-11-22 LAB — HIV ANTIBODY (ROUTINE TESTING W REFLEX): HIV Screen 4th Generation wRfx: NONREACTIVE

## 2020-11-24 ENCOUNTER — Other Ambulatory Visit: Payer: Self-pay

## 2020-11-24 ENCOUNTER — Ambulatory Visit (INDEPENDENT_AMBULATORY_CARE_PROVIDER_SITE_OTHER): Payer: Medicare Other

## 2020-11-24 VITALS — BP 118/79 | HR 82 | Wt 177.0 lb

## 2020-11-24 DIAGNOSIS — Z8751 Personal history of pre-term labor: Secondary | ICD-10-CM | POA: Diagnosis not present

## 2020-11-24 DIAGNOSIS — Z3A29 29 weeks gestation of pregnancy: Secondary | ICD-10-CM

## 2020-11-24 MED ORDER — HYDROXYPROGESTERONE CAPROATE 275 MG/1.1ML ~~LOC~~ SOAJ
275.0000 mg | SUBCUTANEOUS | Status: AC
  Administered 2020-11-24 – 2021-01-05 (×6): 275 mg via SUBCUTANEOUS

## 2020-11-24 NOTE — Progress Notes (Signed)
Patient presents for 17P injection. Patient tolerated injection well. Kathrene Alu RN

## 2020-11-24 NOTE — Progress Notes (Signed)
Chart reviewed - agree with CMA/RN documentation.  ° °

## 2020-11-25 ENCOUNTER — Ambulatory Visit: Payer: Medicare Other

## 2020-11-29 ENCOUNTER — Other Ambulatory Visit: Payer: Self-pay | Admitting: *Deleted

## 2020-11-29 ENCOUNTER — Other Ambulatory Visit: Payer: Self-pay

## 2020-11-29 ENCOUNTER — Ambulatory Visit: Payer: Medicare Other | Attending: Obstetrics

## 2020-11-29 ENCOUNTER — Encounter: Payer: Self-pay | Admitting: *Deleted

## 2020-11-29 ENCOUNTER — Ambulatory Visit: Payer: Medicare Other | Admitting: *Deleted

## 2020-11-29 DIAGNOSIS — O09299 Supervision of pregnancy with other poor reproductive or obstetric history, unspecified trimester: Secondary | ICD-10-CM

## 2020-11-29 DIAGNOSIS — O99213 Obesity complicating pregnancy, third trimester: Secondary | ICD-10-CM | POA: Insufficient documentation

## 2020-11-29 DIAGNOSIS — O09523 Supervision of elderly multigravida, third trimester: Secondary | ICD-10-CM | POA: Diagnosis not present

## 2020-11-29 DIAGNOSIS — D259 Leiomyoma of uterus, unspecified: Secondary | ICD-10-CM

## 2020-11-29 DIAGNOSIS — O09213 Supervision of pregnancy with history of pre-term labor, third trimester: Secondary | ICD-10-CM

## 2020-11-29 DIAGNOSIS — Z6839 Body mass index (BMI) 39.0-39.9, adult: Secondary | ICD-10-CM

## 2020-11-29 DIAGNOSIS — Z3A30 30 weeks gestation of pregnancy: Secondary | ICD-10-CM | POA: Diagnosis not present

## 2020-11-29 DIAGNOSIS — O09899 Supervision of other high risk pregnancies, unspecified trimester: Secondary | ICD-10-CM

## 2020-11-29 DIAGNOSIS — E669 Obesity, unspecified: Secondary | ICD-10-CM

## 2020-11-29 DIAGNOSIS — O3413 Maternal care for benign tumor of corpus uteri, third trimester: Secondary | ICD-10-CM | POA: Insufficient documentation

## 2020-12-01 ENCOUNTER — Ambulatory Visit (INDEPENDENT_AMBULATORY_CARE_PROVIDER_SITE_OTHER): Payer: Medicare Other | Admitting: Family Medicine

## 2020-12-01 ENCOUNTER — Encounter: Payer: Self-pay | Admitting: General Practice

## 2020-12-01 ENCOUNTER — Other Ambulatory Visit: Payer: Self-pay

## 2020-12-01 VITALS — BP 109/63 | HR 108 | Wt 176.0 lb

## 2020-12-01 DIAGNOSIS — O09899 Supervision of other high risk pregnancies, unspecified trimester: Secondary | ICD-10-CM

## 2020-12-01 DIAGNOSIS — Z8751 Personal history of pre-term labor: Secondary | ICD-10-CM

## 2020-12-01 DIAGNOSIS — F319 Bipolar disorder, unspecified: Secondary | ICD-10-CM

## 2020-12-01 DIAGNOSIS — R7303 Prediabetes: Secondary | ICD-10-CM

## 2020-12-01 DIAGNOSIS — Z3A30 30 weeks gestation of pregnancy: Secondary | ICD-10-CM

## 2020-12-01 DIAGNOSIS — E669 Obesity, unspecified: Secondary | ICD-10-CM

## 2020-12-01 DIAGNOSIS — D563 Thalassemia minor: Secondary | ICD-10-CM

## 2020-12-01 DIAGNOSIS — O09523 Supervision of elderly multigravida, third trimester: Secondary | ICD-10-CM

## 2020-12-01 NOTE — Progress Notes (Signed)
   PRENATAL VISIT NOTE  Subjective:  Tiffany Juarez is a 40 y.o. [phone] at [redacted]w[redacted]d being seen today for ongoing prenatal care.  She is currently monitored for the following issues for this high-risk pregnancy and has Paresthesia; Pain of both hip joints; Bipolar disorder ([place]); Sleep walking; Alteration consciousness; Other chronic pain; Prediabetes; Vitamin D deficiency; Smoker; Obesity (BMI 30-39.9); Pure hypercholesterolemia; Dry skin; Night sweats; Supervision of other high risk pregnancy, antepartum, unspecified trimester; AMA (advanced maternal age) multigravida 35+; Hx of preeclampsia, prior pregnancy, currently pregnant; History of preterm delivery; and Fibroid on their problem list.  Patient reports feeling achy.  Contractions: Irritability. Vag. Bleeding: None.  Movement: Present. Denies leaking of fluid.   The following portions of the patient's history were reviewed and updated as appropriate: allergies, current medications, past family history, past medical history, past social history, past surgical history and problem list.   Objective:   Vitals:   12/01/20 0905  BP: 109/63  Pulse: (!) 108  Weight: 176 lb (79.8 kg)    Fetal Status: Fetal Heart Rate (bpm): 149   Movement: Present     General:  Alert, oriented and cooperative. Patient is in no acute distress.  Skin: Skin is warm and dry. No rash noted.   Cardiovascular: Normal heart rate noted  Respiratory: Normal respiratory effort, no problems with respiration noted  Abdomen: Soft, gravid, appropriate for gestational age.  Pain/Pressure: Present     Pelvic: Cervical exam deferred        Extremities: Normal range of motion.  Edema: None  Mental Status: Normal mood and affect. Normal behavior. Normal judgment and thought content.   Assessment and Plan:  Pregnancy: [id number] at [redacted]w[redacted]d 1. [redacted] weeks gestation of pregnancy  2. Supervision of other high risk pregnancy, antepartum, unspecified trimester FHT and FH normal  3.  Multigravida of advanced maternal age in third trimester On ASA 81mg   4. History of preterm delivery On Makena  5. Prediabetes 2hr GTT normal  6. Bipolar affective disorder, remission status unspecified ([place])  7. Alpha thalassemia silent carrier Genetic counseling done  8. Obesity (BMI 30-39.9)   Preterm labor symptoms and general obstetric precautions including but not limited to vaginal bleeding, contractions, leaking of fluid and fetal movement were reviewed in detail with the patient. Please refer to After Visit Summary for other counseling recommendations.   No follow-ups on file.  Future Appointments  Date Time Provider [place]  12/09/2020  8:55 AM [facility] NURSE [facility] None  12/16/2020  9:30 AM Lavonia Drafts, MD C[facility] None  12/23/2020  8:55 AM [facility] NURSE [facility] None  12/29/2020  9:00 AM Truett Mainland, DO CWH-WMHP None  01/03/2021  8:00 AM [facility] NURSE [facility]  01/03/2021  8:15 AM [facility]MFC US2 [facility]  01/13/2021  8:45 AM Truett Mainland, DO CWH-WMHP None  01/19/2021  9:15 AM Truett Mainland, DO C[facility]-WMHP None  11/20/2021  9:30 AM Girtha Rm, NP-C PFM-PFM [place], DO

## 2020-12-09 ENCOUNTER — Ambulatory Visit (INDEPENDENT_AMBULATORY_CARE_PROVIDER_SITE_OTHER): Payer: Medicare Other

## 2020-12-09 ENCOUNTER — Other Ambulatory Visit: Payer: Self-pay

## 2020-12-09 VITALS — BP 108/59 | HR 61 | Wt 181.0 lb

## 2020-12-09 DIAGNOSIS — Z8751 Personal history of pre-term labor: Secondary | ICD-10-CM

## 2020-12-09 NOTE — Progress Notes (Addendum)
Pt presents today for Makena injection. Makena 275mg  given Right arm. Pt tolerated well with no adverse side effects noted. Pt to return in 1 week for repeat injection.   Attestation of Attending Supervision of CMA/RN: Evaluation and management procedures were performed by the nurse under my supervision and collaboration.  I have reviewed the nursing note and chart, and I agree with the management and plan.  Carolyn L. Harraway-Smith, M.D., Cherlynn June

## 2020-12-13 DIAGNOSIS — F3131 Bipolar disorder, current episode depressed, mild: Secondary | ICD-10-CM | POA: Diagnosis not present

## 2020-12-14 ENCOUNTER — Encounter: Payer: Medicare Other | Admitting: Family Medicine

## 2020-12-16 ENCOUNTER — Encounter: Payer: Medicare Other | Admitting: Obstetrics & Gynecology

## 2020-12-21 ENCOUNTER — Telehealth: Payer: Self-pay

## 2020-12-21 NOTE — Telephone Encounter (Signed)
Talked to Tiffany Juarez at [facility] states that the pt's signature is needed to send [facility] Rx. Tiffany Juarez states that the pharmacy will reach out to the pt. Tiffany Juarez Juarez l Tiffany Juarez Juarez, CMA

## 2020-12-23 ENCOUNTER — Other Ambulatory Visit: Payer: Self-pay

## 2020-12-23 ENCOUNTER — Ambulatory Visit (INDEPENDENT_AMBULATORY_CARE_PROVIDER_SITE_OTHER): Payer: Medicare Other

## 2020-12-23 VITALS — BP 105/69 | HR 101 | Wt 181.0 lb

## 2020-12-23 DIAGNOSIS — Z8751 Personal history of pre-term labor: Secondary | ICD-10-CM | POA: Diagnosis not present

## 2020-12-23 DIAGNOSIS — Z3A33 33 weeks gestation of pregnancy: Secondary | ICD-10-CM | POA: Diagnosis not present

## 2020-12-23 NOTE — Progress Notes (Signed)
Tiffany Juarez here for 17-P  Injection.  Injection administered without complication. Patient will return in one week for next injection.  [place]ik Foot l Geniene List, CMA 12/23/2020  8:56 AM

## 2020-12-29 ENCOUNTER — Encounter: Payer: Self-pay | Admitting: *Deleted

## 2020-12-29 ENCOUNTER — Other Ambulatory Visit: Payer: Self-pay

## 2020-12-29 ENCOUNTER — Ambulatory Visit (INDEPENDENT_AMBULATORY_CARE_PROVIDER_SITE_OTHER): Payer: Medicare Other | Admitting: Family Medicine

## 2020-12-29 VITALS — BP 120/61 | HR 106 | Wt 179.0 lb

## 2020-12-29 DIAGNOSIS — O09299 Supervision of pregnancy with other poor reproductive or obstetric history, unspecified trimester: Secondary | ICD-10-CM

## 2020-12-29 DIAGNOSIS — O09899 Supervision of other high risk pregnancies, unspecified trimester: Secondary | ICD-10-CM

## 2020-12-29 DIAGNOSIS — Z8751 Personal history of pre-term labor: Secondary | ICD-10-CM

## 2020-12-29 DIAGNOSIS — O09893 Supervision of other high risk pregnancies, third trimester: Secondary | ICD-10-CM

## 2020-12-29 DIAGNOSIS — Z3A34 34 weeks gestation of pregnancy: Secondary | ICD-10-CM

## 2020-12-29 DIAGNOSIS — E669 Obesity, unspecified: Secondary | ICD-10-CM

## 2020-12-29 DIAGNOSIS — O09523 Supervision of elderly multigravida, third trimester: Secondary | ICD-10-CM

## 2020-12-29 NOTE — Progress Notes (Signed)
   PRENATAL VISIT NOTE  Subjective:  Tiffany Juarez is a 40 y.o. [phone] at [redacted]w[redacted]d being seen today for ongoing prenatal care.  She is currently monitored for the following issues for this high-risk pregnancy and has Paresthesia; Pain of both hip joints; Bipolar disorder ([place]); Sleep walking; Alteration consciousness; Other chronic pain; Prediabetes; Vitamin D deficiency; Smoker; Obesity (BMI 30-39.9); Pure hypercholesterolemia; Dry skin; Night sweats; Supervision of other high risk pregnancy, antepartum, unspecified trimester; AMA (advanced maternal age) multigravida 35+; Hx of preeclampsia, prior pregnancy, currently pregnant; History of preterm delivery; and Fibroid on their problem list.  Patient reports  pressure .  Contractions: Irritability. Vag. Bleeding: None.  Movement: Present. Denies leaking of fluid.   The following portions of the patient's history were reviewed and updated as appropriate: allergies, current medications, past family history, past medical history, past social history, past surgical history and problem list.   Objective:   Vitals:   12/29/20 0912  BP: 120/61  Pulse: (!) 106  Weight: 179 lb (81.2 kg)    Fetal Status: Fetal Heart Rate (bpm): 147 Fundal Height: 34 cm Movement: Present     General:  Alert, oriented and cooperative. Patient is in no acute distress.  Skin: Skin is warm and dry. No rash noted.   Cardiovascular: Normal heart rate noted  Respiratory: Normal respiratory effort, no problems with respiration noted  Abdomen: Soft, gravid, appropriate for gestational age.  Pain/Pressure: Present     Pelvic: Cervical exam deferred        Extremities: Normal range of motion.  Edema: Trace  Mental Status: Normal mood and affect. Normal behavior. Normal judgment and thought content.   Assessment and Plan:  Pregnancy: [id number] at [redacted]w[redacted]d 1. [redacted] weeks gestation of pregnancy  2. Supervision of other high risk pregnancy, antepartum, unspecified trimester FHT and FH  normal  3. Multigravida of advanced maternal age in third trimester On ASA 81mg   4. Hx of preeclampsia, prior pregnancy, currently pregnant BP controlled  5. History of preterm delivery  6. Obesity (BMI 30-39.9)  7. H/o C/s Desires rpt with BTL.  Preterm labor symptoms and general obstetric precautions including but not limited to vaginal bleeding, contractions, leaking of fluid and fetal movement were reviewed in detail with the patient. Please refer to After Visit Summary for other counseling recommendations.   No follow-ups on file.  Future Appointments  Date Time Provider [place]  01/03/2021  8:15 AM [facility] NURSE [facility]  01/03/2021  8:30 AM [facility]FC US3 [facility]  01/05/2021  9:00 AM CWH-WMHP NURSE C[facility]HP None  01/13/2021  8:45 AM Truett Mainland, DO CWH-WMHP None  01/19/2021  9:15 AM Truett Mainland, DO CWH-WMHP None  11/20/2021  9:30 AM Girtha Rm, NP-C PFM-PFM [place], DO

## 2020-12-29 NOTE — Progress Notes (Addendum)
+   Fetal movement. Pt c/o increased pain and pressure. Pt denies any leaking fluid.   Makena 275mg /1.50mL given Right arm. Pt tolerated well with no adverse side effects noted. Pt to return in one week for repeat injection.

## 2021-01-03 ENCOUNTER — Other Ambulatory Visit: Payer: Self-pay

## 2021-01-03 ENCOUNTER — Encounter: Payer: Self-pay | Admitting: *Deleted

## 2021-01-03 ENCOUNTER — Ambulatory Visit: Payer: Medicare Other | Admitting: *Deleted

## 2021-01-03 ENCOUNTER — Ambulatory Visit: Payer: Medicare Other | Attending: Obstetrics

## 2021-01-03 VITALS — BP 121/79 | HR 105

## 2021-01-03 DIAGNOSIS — O09899 Supervision of other high risk pregnancies, unspecified trimester: Secondary | ICD-10-CM | POA: Insufficient documentation

## 2021-01-03 DIAGNOSIS — O3413 Maternal care for benign tumor of corpus uteri, third trimester: Secondary | ICD-10-CM | POA: Diagnosis not present

## 2021-01-03 DIAGNOSIS — O09299 Supervision of pregnancy with other poor reproductive or obstetric history, unspecified trimester: Secondary | ICD-10-CM | POA: Insufficient documentation

## 2021-01-03 DIAGNOSIS — O34219 Maternal care for unspecified type scar from previous cesarean delivery: Secondary | ICD-10-CM

## 2021-01-03 DIAGNOSIS — O09213 Supervision of pregnancy with history of pre-term labor, third trimester: Secondary | ICD-10-CM

## 2021-01-03 DIAGNOSIS — O99213 Obesity complicating pregnancy, third trimester: Secondary | ICD-10-CM

## 2021-01-03 DIAGNOSIS — Z362 Encounter for other antenatal screening follow-up: Secondary | ICD-10-CM | POA: Diagnosis not present

## 2021-01-03 DIAGNOSIS — Z3A35 35 weeks gestation of pregnancy: Secondary | ICD-10-CM

## 2021-01-03 DIAGNOSIS — D259 Leiomyoma of uterus, unspecified: Secondary | ICD-10-CM | POA: Diagnosis not present

## 2021-01-03 DIAGNOSIS — E669 Obesity, unspecified: Secondary | ICD-10-CM

## 2021-01-03 DIAGNOSIS — O09523 Supervision of elderly multigravida, third trimester: Secondary | ICD-10-CM

## 2021-01-05 ENCOUNTER — Other Ambulatory Visit: Payer: Self-pay

## 2021-01-05 ENCOUNTER — Ambulatory Visit (INDEPENDENT_AMBULATORY_CARE_PROVIDER_SITE_OTHER): Payer: Medicare Other

## 2021-01-05 VITALS — BP 116/73 | HR 117 | Ht 60.0 in | Wt 183.0 lb

## 2021-01-05 DIAGNOSIS — Z8751 Personal history of pre-term labor: Secondary | ICD-10-CM

## 2021-01-05 NOTE — Progress Notes (Signed)
Tiffany Juarez 275mg /1.24mL given Left arm. Pt tolerated well with no adverse side effects noted. Pt to return in one week for repeat injection and ob appointment.

## 2021-01-13 ENCOUNTER — Other Ambulatory Visit: Payer: Self-pay

## 2021-01-13 ENCOUNTER — Other Ambulatory Visit (HOSPITAL_COMMUNITY)
Admission: RE | Admit: 2021-01-13 | Discharge: 2021-01-13 | Disposition: A | Discharge status: Home / Self Care | Payer: Medicare Other | Source: Ambulatory Visit | Attending: Family Medicine | Admitting: Family Medicine

## 2021-01-13 ENCOUNTER — Ambulatory Visit (INDEPENDENT_AMBULATORY_CARE_PROVIDER_SITE_OTHER): Payer: Medicare Other | Admitting: Family Medicine

## 2021-01-13 VITALS — BP 115/74 | HR 109 | Wt 179.0 lb

## 2021-01-13 DIAGNOSIS — Z8751 Personal history of pre-term labor: Secondary | ICD-10-CM | POA: Diagnosis not present

## 2021-01-13 DIAGNOSIS — O09899 Supervision of other high risk pregnancies, unspecified trimester: Secondary | ICD-10-CM

## 2021-01-13 DIAGNOSIS — O09299 Supervision of pregnancy with other poor reproductive or obstetric history, unspecified trimester: Secondary | ICD-10-CM

## 2021-01-13 DIAGNOSIS — O09893 Supervision of other high risk pregnancies, third trimester: Secondary | ICD-10-CM

## 2021-01-13 DIAGNOSIS — Z3A36 36 weeks gestation of pregnancy: Secondary | ICD-10-CM

## 2021-01-13 DIAGNOSIS — O09523 Supervision of elderly multigravida, third trimester: Secondary | ICD-10-CM

## 2021-01-13 DIAGNOSIS — O09529 Supervision of elderly multigravida, unspecified trimester: Secondary | ICD-10-CM

## 2021-01-13 DIAGNOSIS — O99213 Obesity complicating pregnancy, third trimester: Secondary | ICD-10-CM

## 2021-01-13 DIAGNOSIS — E669 Obesity, unspecified: Secondary | ICD-10-CM

## 2021-01-13 DIAGNOSIS — O09293 Supervision of pregnancy with other poor reproductive or obstetric history, third trimester: Secondary | ICD-10-CM

## 2021-01-13 MED ORDER — HYDROXYPROGESTERONE CAPROATE 275 MG/1.1ML ~~LOC~~ SOAJ
275.0000 mg | Freq: Once | SUBCUTANEOUS | Status: AC
  Administered 2021-01-13: 275 mg via SUBCUTANEOUS

## 2021-01-13 NOTE — Progress Notes (Signed)
   PRENATAL VISIT NOTE  Subjective:  Tiffany Juarez is a 40 y.o. [phone] at [redacted]w[redacted]d being seen today for ongoing prenatal care.  She is currently monitored for the following issues for this high-risk pregnancy and has Paresthesia; Pain of both hip joints; Bipolar disorder ([place]); Sleep walking; Alteration consciousness; Other chronic pain; Prediabetes; Vitamin D deficiency; Smoker; Obesity (BMI 30-39.9); Pure hypercholesterolemia; Dry skin; Night sweats; Supervision of other high risk pregnancy, antepartum, unspecified trimester; AMA (advanced maternal age) multigravida 35+; Hx of preeclampsia, prior pregnancy, currently pregnant; History of preterm delivery; and Fibroid on their problem list.  Patient reports no complaints.  Contractions: Irregular. Vag. Bleeding: None.  Movement: Present. Denies leaking of fluid.   The following portions of the patient's history were reviewed and updated as appropriate: allergies, current medications, past family history, past medical history, past social history, past surgical history and problem list.   Objective:   Vitals:   01/13/21 0853  BP: 115/74  Pulse: (!) 109  Weight: 179 lb (81.2 kg)    Fetal Status: Fetal Heart Rate (bpm): 143 Fundal Height: 36 cm Movement: Present  Presentation: Vertex  General:  Alert, oriented and cooperative. Patient is in no acute distress.  Skin: Skin is warm and dry. No rash noted.   Cardiovascular: Normal heart rate noted  Respiratory: Normal respiratory effort, no problems with respiration noted  Abdomen: Soft, gravid, appropriate for gestational age.  Pain/Pressure: Present     Pelvic: Cervical exam deferred Dilation: 2.5 Effacement (%): 60 Station: -3  Extremities: Normal range of motion.  Edema: Trace  Mental Status: Normal mood and affect. Normal behavior. Normal judgment and thought content.   Assessment and Plan:  Pregnancy: [id number] at [redacted]w[redacted]d 1. Supervision of other high risk pregnancy, antepartum, unspecified  trimester FHT and FH normal  2. Antepartum multigravida of advanced maternal age On ASA 81mg   3. Hx of preeclampsia, prior pregnancy, currently pregnant BP normal  4. Obesity (BMI 30-39.9)  5. History of preterm delivery   Preterm labor symptoms and general obstetric precautions including but not limited to vaginal bleeding, contractions, leaking of fluid and fetal movement were reviewed in detail with the patient. Please refer to After Visit Summary for other counseling recommendations.   No follow-ups on file.  Future Appointments  Date Time Provider [place]  01/19/2021  9:15 AM Truett Mainland, DO CWH-WMHP None  11/20/2021  9:30 AM Girtha Rm, NP-C PFM-PFM [place], DO

## 2021-01-13 NOTE — Addendum Note (Signed)
Addended by: Wendelyn Breslow L on: 01/13/2021 10:19 AM   Modules accepted: Orders

## 2021-01-16 LAB — CERVICOVAGINAL ANCILLARY ONLY
Chlamydia: NEGATIVE
Comment: NEGATIVE
Comment: NORMAL
Neisseria Gonorrhea: NEGATIVE

## 2021-01-17 LAB — CULTURE, BETA STREP (GROUP B ONLY): Strep Gp B Culture: NEGATIVE

## 2021-01-19 ENCOUNTER — Encounter (HOSPITAL_COMMUNITY): Payer: Self-pay | Admitting: Obstetrics & Gynecology

## 2021-01-19 ENCOUNTER — Other Ambulatory Visit: Payer: Self-pay

## 2021-01-19 ENCOUNTER — Inpatient Hospital Stay (HOSPITAL_COMMUNITY): Payer: Medicare Other | Admitting: Anesthesiology

## 2021-01-19 ENCOUNTER — Encounter (HOSPITAL_COMMUNITY)
Admission: AD | Disposition: A | Discharge status: Home / Self Care | Payer: Self-pay | Source: Home / Self Care | Attending: Obstetrics & Gynecology

## 2021-01-19 ENCOUNTER — Encounter: Payer: Medicare Other | Admitting: Family Medicine

## 2021-01-19 ENCOUNTER — Inpatient Hospital Stay (HOSPITAL_COMMUNITY)
Admission: AD | Admit: 2021-01-19 | Discharge: 2021-01-21 | DRG: 788 | Disposition: A | Discharge status: Home / Self Care | Payer: Medicare Other | Attending: Obstetrics & Gynecology | Admitting: Obstetrics & Gynecology

## 2021-01-19 DIAGNOSIS — Z8751 Personal history of pre-term labor: Secondary | ICD-10-CM

## 2021-01-19 DIAGNOSIS — O99334 Smoking (tobacco) complicating childbirth: Secondary | ICD-10-CM | POA: Diagnosis not present

## 2021-01-19 DIAGNOSIS — O99214 Obesity complicating childbirth: Secondary | ICD-10-CM | POA: Diagnosis present

## 2021-01-19 DIAGNOSIS — O3413 Maternal care for benign tumor of corpus uteri, third trimester: Secondary | ICD-10-CM | POA: Diagnosis present

## 2021-01-19 DIAGNOSIS — O26893 Other specified pregnancy related conditions, third trimester: Secondary | ICD-10-CM | POA: Diagnosis present

## 2021-01-19 DIAGNOSIS — F17210 Nicotine dependence, cigarettes, uncomplicated: Secondary | ICD-10-CM | POA: Diagnosis present

## 2021-01-19 DIAGNOSIS — O4202 Full-term premature rupture of membranes, onset of labor within 24 hours of rupture: Secondary | ICD-10-CM | POA: Diagnosis not present

## 2021-01-19 DIAGNOSIS — O34211 Maternal care for low transverse scar from previous cesarean delivery: Secondary | ICD-10-CM | POA: Diagnosis not present

## 2021-01-19 DIAGNOSIS — O4590 Premature separation of placenta, unspecified, unspecified trimester: Secondary | ICD-10-CM | POA: Diagnosis not present

## 2021-01-19 DIAGNOSIS — Z98891 History of uterine scar from previous surgery: Secondary | ICD-10-CM

## 2021-01-19 DIAGNOSIS — D259 Leiomyoma of uterus, unspecified: Secondary | ICD-10-CM | POA: Diagnosis not present

## 2021-01-19 DIAGNOSIS — Z20822 Contact with and (suspected) exposure to covid-19: Secondary | ICD-10-CM | POA: Diagnosis present

## 2021-01-19 DIAGNOSIS — E669 Obesity, unspecified: Secondary | ICD-10-CM | POA: Diagnosis present

## 2021-01-19 DIAGNOSIS — Z3A00 Weeks of gestation of pregnancy not specified: Secondary | ICD-10-CM | POA: Diagnosis not present

## 2021-01-19 DIAGNOSIS — Z3A37 37 weeks gestation of pregnancy: Secondary | ICD-10-CM | POA: Diagnosis not present

## 2021-01-19 DIAGNOSIS — O4593 Premature separation of placenta, unspecified, third trimester: Secondary | ICD-10-CM | POA: Diagnosis not present

## 2021-01-19 DIAGNOSIS — O09299 Supervision of pregnancy with other poor reproductive or obstetric history, unspecified trimester: Secondary | ICD-10-CM

## 2021-01-19 DIAGNOSIS — O411230 Chorioamnionitis, third trimester, not applicable or unspecified: Secondary | ICD-10-CM | POA: Diagnosis not present

## 2021-01-19 DIAGNOSIS — O09529 Supervision of elderly multigravida, unspecified trimester: Secondary | ICD-10-CM

## 2021-01-19 LAB — CBC
HCT: 39.3 % (ref 36.0–46.0)
Hemoglobin: 12.7 g/dL (ref 12.0–15.0)
MCH: 27 pg (ref 26.0–34.0)
MCHC: 32.3 g/dL (ref 30.0–36.0)
MCV: 83.6 fL (ref 80.0–100.0)
Platelets: 216 10*3/uL (ref 150–400)
RBC: 4.7 MIL/uL (ref 3.87–5.11)
RDW: 16.2 % — ABNORMAL HIGH (ref 11.5–15.5)
WBC: 12.6 10*3/uL — ABNORMAL HIGH (ref 4.0–10.5)
nRBC: 0.2 % (ref 0.0–0.2)

## 2021-01-19 LAB — RPR: RPR Ser Ql: NONREACTIVE

## 2021-01-19 LAB — RESP PANEL BY RT-PCR (FLU A&B, COVID) ARPGX2
Influenza A by PCR: NEGATIVE
Influenza B by PCR: NEGATIVE
SARS Coronavirus 2 by RT PCR: NEGATIVE

## 2021-01-19 SURGERY — Surgical Case
Anesthesia: Epidural | Wound class: Clean Contaminated

## 2021-01-19 SURGERY — Surgical Case
Anesthesia: Regional

## 2021-01-19 MED ORDER — DIPHENHYDRAMINE HCL 25 MG PO CAPS
25.0000 mg | ORAL_CAPSULE | Freq: Four times a day (QID) | ORAL | Status: DC | PRN
Start: 2021-01-19 — End: 2021-01-21

## 2021-01-19 MED ORDER — STERILE WATER FOR IRRIGATION IR SOLN
Status: DC | PRN
  Administered 2021-01-19: 1000 mL

## 2021-01-19 MED ORDER — OXYTOCIN BOLUS FROM INFUSION: 333.0000 mL | Freq: Once | INTRAVENOUS | Status: DC

## 2021-01-19 MED ORDER — FENTANYL CITRATE (PF) 100 MCG/2ML IJ SOLN
100.0000 ug | Freq: Once | INTRAMUSCULAR | Status: AC
  Administered 2021-01-19: 100 ug via INTRAVENOUS
  Filled 2021-01-19: qty 2

## 2021-01-19 MED ORDER — SODIUM CHLORIDE 0.9 % IV SOLN
INTRAVENOUS | Status: AC
  Filled 2021-01-19: qty 500

## 2021-01-19 MED ORDER — TETANUS-DIPHTH-ACELL PERTUSSIS 5-2.5-18.5 LF-MCG/0.5 IM SUSY: 0.5000 mL | PREFILLED_SYRINGE | Freq: Once | INTRAMUSCULAR | Status: DC

## 2021-01-19 MED ORDER — GENTAMICIN SULFATE 40 MG/ML IJ SOLN
5.0000 mg/kg | INTRAVENOUS | Status: AC
  Administered 2021-01-19: 320 mg via INTRAVENOUS
  Filled 2021-01-19: qty 8

## 2021-01-19 MED ORDER — FENTANYL-BUPIVACAINE-NACL 0.5-0.125-0.9 MG/250ML-% EP SOLN
12.0000 mL/h | EPIDURAL | Status: DC | PRN
  Administered 2021-01-19: 12 mL/h via EPIDURAL

## 2021-01-19 MED ORDER — TERBUTALINE SULFATE 1 MG/ML IJ SOLN: 0.2500 mg | Freq: Once | INTRAMUSCULAR | Status: AC

## 2021-01-19 MED ORDER — OXYTOCIN-SODIUM CHLORIDE 30-0.9 UT/500ML-% IV SOLN: 1.0000 m[IU]/min | INTRAVENOUS | Status: DC

## 2021-01-19 MED ORDER — SIMETHICONE 80 MG PO CHEW
80.0000 mg | CHEWABLE_TABLET | Freq: Three times a day (TID) | ORAL | Status: DC
  Administered 2021-01-20 – 2021-01-21 (×5): 80 mg via ORAL
  Filled 2021-01-19 (×5): qty 1

## 2021-01-19 MED ORDER — MORPHINE SULFATE (PF) 0.5 MG/ML IJ SOLN
INTRAMUSCULAR | Status: AC
  Filled 2021-01-19: qty 10

## 2021-01-19 MED ORDER — CLINDAMYCIN PHOSPHATE 900 MG/50ML IV SOLN: 900.0000 mg | Freq: Once | INTRAVENOUS | Status: DC

## 2021-01-19 MED ORDER — FENTANYL-BUPIVACAINE-NACL 0.5-0.125-0.9 MG/250ML-% EP SOLN
EPIDURAL | Status: AC
  Filled 2021-01-19: qty 250

## 2021-01-19 MED ORDER — MORPHINE SULFATE (PF) 0.5 MG/ML IJ SOLN
INTRAMUSCULAR | Status: DC | PRN
  Administered 2021-01-19: 3 mg via EPIDURAL

## 2021-01-19 MED ORDER — SIMETHICONE 80 MG PO CHEW: 80.0000 mg | CHEWABLE_TABLET | ORAL | Status: DC | PRN

## 2021-01-19 MED ORDER — TERBUTALINE SULFATE 1 MG/ML IJ SOLN
0.2500 mg | Freq: Once | INTRAMUSCULAR | Status: DC | PRN
  Filled 2021-01-19: qty 1

## 2021-01-19 MED ORDER — FENTANYL CITRATE (PF) 100 MCG/2ML IJ SOLN: 50.0000 ug | INTRAMUSCULAR | Status: DC | PRN

## 2021-01-19 MED ORDER — SENNOSIDES-DOCUSATE SODIUM 8.6-50 MG PO TABS
2.0000 | ORAL_TABLET | Freq: Every day | ORAL | Status: DC
  Administered 2021-01-20 – 2021-01-21 (×2): 2 via ORAL
  Filled 2021-01-19 (×2): qty 2

## 2021-01-19 MED ORDER — PRENATAL MULTIVITAMIN CH
1.0000 | ORAL_TABLET | Freq: Every day | ORAL | Status: DC
  Administered 2021-01-20 – 2021-01-21 (×2): 1 via ORAL
  Filled 2021-01-19 (×2): qty 1

## 2021-01-19 MED ORDER — CLINDAMYCIN PHOSPHATE 900 MG/50ML IV SOLN
INTRAVENOUS | Status: DC | PRN
  Administered 2021-01-19: 900 mg via INTRAVENOUS

## 2021-01-19 MED ORDER — HYDROXYZINE HCL 50 MG PO TABS: 50.0000 mg | ORAL_TABLET | Freq: Four times a day (QID) | ORAL | Status: DC | PRN

## 2021-01-19 MED ORDER — GENTAMICIN SULFATE 40 MG/ML IJ SOLN: 5.0000 mg/kg | Freq: Once | INTRAVENOUS | Status: DC

## 2021-01-19 MED ORDER — OXYCODONE HCL 5 MG PO TABS
5.0000 mg | ORAL_TABLET | ORAL | Status: DC | PRN
  Administered 2021-01-20: 10 mg via ORAL
  Administered 2021-01-20: 5 mg via ORAL
  Administered 2021-01-20 – 2021-01-21 (×3): 10 mg via ORAL
  Filled 2021-01-19 (×4): qty 2
  Filled 2021-01-19: qty 1

## 2021-01-19 MED ORDER — PHENYLEPHRINE 40 MCG/ML (10ML) SYRINGE FOR IV PUSH (FOR BLOOD PRESSURE SUPPORT)
80.0000 ug | PREFILLED_SYRINGE | INTRAVENOUS | Status: DC | PRN
  Administered 2021-01-19: 80 ug via INTRAVENOUS

## 2021-01-19 MED ORDER — MENTHOL 3 MG MT LOZG: 1.0000 | LOZENGE | OROMUCOSAL | Status: DC | PRN

## 2021-01-19 MED ORDER — MEDROXYPROGESTERONE ACETATE 150 MG/ML IM SUSP: 150.0000 mg | INTRAMUSCULAR | Status: DC | PRN

## 2021-01-19 MED ORDER — MEASLES, MUMPS & RUBELLA VAC IJ SOLR: 0.5000 mL | Freq: Once | INTRAMUSCULAR | Status: DC

## 2021-01-19 MED ORDER — OXYTOCIN-SODIUM CHLORIDE 30-0.9 UT/500ML-% IV SOLN: 2.5000 [IU]/h | INTRAVENOUS | Status: AC

## 2021-01-19 MED ORDER — CLINDAMYCIN PHOSPHATE 900 MG/50ML IV SOLN: 900.0000 mg | INTRAVENOUS | Status: DC

## 2021-01-19 MED ORDER — ONDANSETRON HCL 4 MG/2ML IJ SOLN
INTRAMUSCULAR | Status: DC | PRN
  Administered 2021-01-19: 4 mg via INTRAVENOUS

## 2021-01-19 MED ORDER — SODIUM CHLORIDE 0.9 % IR SOLN
Status: DC | PRN
  Administered 2021-01-19: 1000 mL

## 2021-01-19 MED ORDER — SODIUM CHLORIDE 0.9 % IV SOLN
INTRAVENOUS | Status: DC | PRN
  Administered 2021-01-19: 500 mg via INTRAVENOUS

## 2021-01-19 MED ORDER — TRANEXAMIC ACID-NACL 1000-0.7 MG/100ML-% IV SOLN
1000.0000 mg | INTRAVENOUS | Status: AC
  Administered 2021-01-19: 1000 mg via INTRAVENOUS

## 2021-01-19 MED ORDER — LACTATED RINGERS IV SOLN: INTRAVENOUS | Status: DC

## 2021-01-19 MED ORDER — DEXAMETHASONE SODIUM PHOSPHATE 10 MG/ML IJ SOLN
INTRAMUSCULAR | Status: AC
  Filled 2021-01-19: qty 1

## 2021-01-19 MED ORDER — OXYTOCIN-SODIUM CHLORIDE 30-0.9 UT/500ML-% IV SOLN: 2.5000 [IU]/h | INTRAVENOUS | Status: DC

## 2021-01-19 MED ORDER — OXYTOCIN-SODIUM CHLORIDE 30-0.9 UT/500ML-% IV SOLN
INTRAVENOUS | Status: DC | PRN
  Administered 2021-01-19: 30 [IU] via INTRAVENOUS

## 2021-01-19 MED ORDER — OXYTOCIN 10 UNIT/ML IJ SOLN
INTRAMUSCULAR | Status: AC
  Filled 2021-01-19: qty 2

## 2021-01-19 MED ORDER — OXYTOCIN-SODIUM CHLORIDE 30-0.9 UT/500ML-% IV SOLN
INTRAVENOUS | Status: AC
  Administered 2021-01-19: 2 m[IU]/min via INTRAVENOUS
  Filled 2021-01-19: qty 500

## 2021-01-19 MED ORDER — CEFAZOLIN SODIUM-DEXTROSE 2-4 GM/100ML-% IV SOLN
INTRAVENOUS | Status: AC
  Filled 2021-01-19: qty 100

## 2021-01-19 MED ORDER — OXYCODONE-ACETAMINOPHEN 5-325 MG PO TABS: 2.0000 | ORAL_TABLET | ORAL | Status: DC | PRN

## 2021-01-19 MED ORDER — ONDANSETRON HCL 4 MG/2ML IJ SOLN: 4.0000 mg | Freq: Four times a day (QID) | INTRAMUSCULAR | Status: DC | PRN

## 2021-01-19 MED ORDER — DIPHENHYDRAMINE HCL 50 MG/ML IJ SOLN: 12.5000 mg | INTRAMUSCULAR | Status: DC | PRN

## 2021-01-19 MED ORDER — SOD CITRATE-CITRIC ACID 500-334 MG/5ML PO SOLN
30.0000 mL | ORAL | Status: DC | PRN
  Filled 2021-01-19: qty 30

## 2021-01-19 MED ORDER — ACETAMINOPHEN 325 MG PO TABS
650.0000 mg | ORAL_TABLET | ORAL | Status: DC | PRN
  Administered 2021-01-19 – 2021-01-21 (×7): 650 mg via ORAL
  Filled 2021-01-19 (×7): qty 2

## 2021-01-19 MED ORDER — ONDANSETRON HCL 4 MG/2ML IJ SOLN
INTRAMUSCULAR | Status: AC
  Filled 2021-01-19: qty 2

## 2021-01-19 MED ORDER — PHENYLEPHRINE 40 MCG/ML (10ML) SYRINGE FOR IV PUSH (FOR BLOOD PRESSURE SUPPORT)
80.0000 ug | PREFILLED_SYRINGE | INTRAVENOUS | Status: DC | PRN
  Filled 2021-01-19: qty 10

## 2021-01-19 MED ORDER — TERBUTALINE SULFATE 1 MG/ML IJ SOLN
INTRAMUSCULAR | Status: AC
  Administered 2021-01-19: 0.25 mg via SUBCUTANEOUS
  Filled 2021-01-19: qty 1

## 2021-01-19 MED ORDER — LIDOCAINE HCL (PF) 1 % IJ SOLN: 30.0000 mL | INTRAMUSCULAR | Status: DC | PRN

## 2021-01-19 MED ORDER — COCONUT OIL OIL: 1.0000 "application " | TOPICAL_OIL | Status: DC | PRN

## 2021-01-19 MED ORDER — LACTATED RINGERS IV SOLN: INTRAVENOUS | Status: DC | PRN

## 2021-01-19 MED ORDER — KETOROLAC TROMETHAMINE 30 MG/ML IJ SOLN
30.0000 mg | Freq: Four times a day (QID) | INTRAMUSCULAR | Status: AC
  Administered 2021-01-19 – 2021-01-20 (×2): 30 mg via INTRAVENOUS
  Filled 2021-01-19 (×2): qty 1

## 2021-01-19 MED ORDER — WITCH HAZEL-GLYCERIN EX PADS: 1.0000 "application " | MEDICATED_PAD | CUTANEOUS | Status: DC | PRN

## 2021-01-19 MED ORDER — LACTATED RINGERS IV SOLN
500.0000 mL | Freq: Once | INTRAVENOUS | Status: AC
  Administered 2021-01-19: 500 mL via INTRAVENOUS

## 2021-01-19 MED ORDER — ENOXAPARIN SODIUM 40 MG/0.4ML IJ SOSY
40.0000 mg | PREFILLED_SYRINGE | INTRAMUSCULAR | Status: DC
  Administered 2021-01-20 – 2021-01-21 (×2): 40 mg via SUBCUTANEOUS
  Filled 2021-01-19 (×2): qty 0.4

## 2021-01-19 MED ORDER — DEXAMETHASONE SODIUM PHOSPHATE 10 MG/ML IJ SOLN
INTRAMUSCULAR | Status: DC | PRN
  Administered 2021-01-19: 10 mg via INTRAVENOUS

## 2021-01-19 MED ORDER — LIDOCAINE HCL (PF) 1 % IJ SOLN
INTRAMUSCULAR | Status: DC | PRN
  Administered 2021-01-19: 10 mL via EPIDURAL

## 2021-01-19 MED ORDER — TRANEXAMIC ACID-NACL 1000-0.7 MG/100ML-% IV SOLN
INTRAVENOUS | Status: AC
  Filled 2021-01-19: qty 100

## 2021-01-19 MED ORDER — EPHEDRINE 5 MG/ML INJ: 10.0000 mg | INTRAVENOUS | Status: DC | PRN

## 2021-01-19 MED ORDER — IBUPROFEN 600 MG PO TABS
600.0000 mg | ORAL_TABLET | Freq: Four times a day (QID) | ORAL | Status: DC
  Administered 2021-01-20 – 2021-01-21 (×5): 600 mg via ORAL
  Filled 2021-01-19 (×5): qty 1

## 2021-01-19 MED ORDER — LACTATED RINGERS IV SOLN
500.0000 mL | INTRAVENOUS | Status: DC | PRN
  Administered 2021-01-19 (×2): 500 mL via INTRAVENOUS

## 2021-01-19 MED ORDER — FLEET ENEMA 7-19 GM/118ML RE ENEM: 1.0000 | ENEMA | Freq: Every day | RECTAL | Status: DC | PRN

## 2021-01-19 MED ORDER — SOD CITRATE-CITRIC ACID 500-334 MG/5ML PO SOLN
30.0000 mL | ORAL | Status: AC
  Administered 2021-01-19: 30 mL via ORAL

## 2021-01-19 MED ORDER — ACETAMINOPHEN 325 MG PO TABS
650.0000 mg | ORAL_TABLET | ORAL | Status: DC | PRN
  Administered 2021-01-19: 650 mg via ORAL
  Filled 2021-01-19: qty 2

## 2021-01-19 MED ORDER — DIBUCAINE (PERIANAL) 1 % EX OINT: 1.0000 "application " | TOPICAL_OINTMENT | CUTANEOUS | Status: DC | PRN

## 2021-01-19 MED ORDER — LIDOCAINE-EPINEPHRINE (PF) 2 %-1:200000 IJ SOLN
INTRAMUSCULAR | Status: DC | PRN
  Administered 2021-01-19 (×2): 5 mL via EPIDURAL

## 2021-01-19 MED ORDER — OXYCODONE-ACETAMINOPHEN 5-325 MG PO TABS: 1.0000 | ORAL_TABLET | ORAL | Status: DC | PRN

## 2021-01-19 SURGICAL SUPPLY — 37 items
BENZOIN TINCTURE PRP APPL 2/3 (GAUZE/BANDAGES/DRESSINGS) ×2 IMPLANT
CHLORAPREP W/TINT 26ML (MISCELLANEOUS) ×2 IMPLANT
CLAMP CORD UMBIL (MISCELLANEOUS) IMPLANT
CLOTH BEACON ORANGE TIMEOUT ST (SAFETY) ×2 IMPLANT
DERMABOND ADVANCED (GAUZE/BANDAGES/DRESSINGS)
DERMABOND ADVANCED .7 DNX12 (GAUZE/BANDAGES/DRESSINGS) IMPLANT
DRSG OPSITE POSTOP 4X10 (GAUZE/BANDAGES/DRESSINGS) ×2 IMPLANT
ELECT REM PT RETURN 9FT ADLT (ELECTROSURGICAL) ×2
ELECTRODE REM PT RTRN 9FT ADLT (ELECTROSURGICAL) ×1 IMPLANT
EXTRACTOR VACUUM KIWI (MISCELLANEOUS) IMPLANT
GAUZE SPONGE 4X4 12PLY STRL LF (GAUZE/BANDAGES/DRESSINGS) ×4 IMPLANT
GLOVE BIOGEL PI IND STRL 7.0 (GLOVE) ×3 IMPLANT
GLOVE BIOGEL PI INDICATOR 7.0 (GLOVE) ×3
GLOVE ECLIPSE 6.5 STRL STRAW (GLOVE) ×2 IMPLANT
GOWN STRL REUS W/TWL LRG LVL3 (GOWN DISPOSABLE) ×6 IMPLANT
HEMOSTAT ARISTA ABSORB 3G PWDR (HEMOSTASIS) ×2 IMPLANT
KIT ABG SYR 3ML LUER SLIP (SYRINGE) ×2 IMPLANT
NEEDLE HYPO 25X5/8 SAFETYGLIDE (NEEDLE) ×2 IMPLANT
NS IRRIG 1000ML POUR BTL (IV SOLUTION) ×2 IMPLANT
PACK C SECTION WH (CUSTOM PROCEDURE TRAY) ×2 IMPLANT
PAD ABD 7.5X8 STRL (GAUZE/BANDAGES/DRESSINGS) ×2 IMPLANT
PAD OB MATERNITY 4.3X12.25 (PERSONAL CARE ITEMS) ×2 IMPLANT
PENCIL SMOKE EVAC W/HOLSTER (ELECTROSURGICAL) ×2 IMPLANT
RTRCTR C-SECT PINK 25CM LRG (MISCELLANEOUS) ×2 IMPLANT
STRIP CLOSURE SKIN 1/2X4 (GAUZE/BANDAGES/DRESSINGS) ×2 IMPLANT
SUT PLAIN 0 NONE (SUTURE) IMPLANT
SUT PLAIN 2 0 XLH (SUTURE) IMPLANT
SUT VIC AB 0 CT1 27 (SUTURE) ×2
SUT VIC AB 0 CT1 27XBRD ANBCTR (SUTURE) ×2 IMPLANT
SUT VIC AB 0 CTX 36 (SUTURE) ×3
SUT VIC AB 0 CTX36XBRD ANBCTRL (SUTURE) ×3 IMPLANT
SUT VIC AB 2-0 CT1 27 (SUTURE) ×1
SUT VIC AB 2-0 CT1 TAPERPNT 27 (SUTURE) ×1 IMPLANT
SUT VIC AB 4-0 KS 27 (SUTURE) ×4 IMPLANT
TOWEL OR 17X24 6PK STRL BLUE (TOWEL DISPOSABLE) ×2 IMPLANT
TRAY FOLEY W/BAG SLVR 14FR LF (SET/KITS/TRAYS/PACK) IMPLANT
WATER STERILE IRR 1000ML POUR (IV SOLUTION) ×2 IMPLANT

## 2021-01-19 NOTE — Anesthesia Postprocedure Evaluation (Signed)
Anesthesia Post Note  Patient: Suzanna Zahn  Procedure(s) Performed: CESAREAN SECTION     Patient location during evaluation: PACU Anesthesia Type: Epidural Level of consciousness: awake and alert Pain management: pain level controlled Vital Signs Assessment: post-procedure vital signs reviewed and stable Respiratory status: spontaneous breathing and respiratory function stable Cardiovascular status: blood pressure returned to baseline and stable Postop Assessment: spinal receding Anesthetic complications: no   No notable events documented.  Last Vitals:  Vitals:   01/19/21 1728 01/19/21 1745  BP: 112/61 (!) 116/53  Pulse: (!) 120 (!) 123  Resp: 20 20  Temp:    SpO2: 100% 100%    Last Pain:  Vitals:   01/19/21 1716  TempSrc: Oral  PainSc:    Pain Goal: Patients Stated Pain Goal: 0 (01/19/21 3382)              Epidural/Spinal Function Cutaneous sensation: Able to Discern Pressure (01/19/21 1716), Patient able to flex knees: No (01/19/21 1716), Patient able to lift hips off bed: No (01/19/21 1716), Back pain beyond tenderness at insertion site: No (01/19/21 1716), Progressively worsening motor and/or sensory loss: No (01/19/21 1716), Bowel and/or bladder incontinence post epidural: No (01/19/21 1716)  Rosevelt Luu DANIEL

## 2021-01-19 NOTE — Discharge Summary (Addendum)
Postpartum Discharge Summary  Date of Service updated: 01/21/2021     Patient Name: Tiffany Juarez DOB: 16-Sep-1980 MRN: [id number]  Date of admission: 01/19/2021 Delivery date:01/19/2021  Delivering provider: Janyth Pupa  Date of discharge: 01/21/2021  Admitting diagnosis: Labor and delivery, indication for care [O75.9] Intrauterine pregnancy: [redacted]w[redacted]d    Secondary diagnosis:  Active Problems:   Obesity (BMI 30-39.9)   AMA (advanced maternal age) multigravida 35+   Hx of preeclampsia, prior pregnancy, currently pregnant   History of preterm delivery   History of cesarean delivery   Cesarean delivery delivered  Additional problems: N/A    Discharge diagnosis: Term Pregnancy Delivered                                              Post partum procedures: N/A Augmentation: Pitocin Complications: Placental Abruption  Hospital course: Onset of Labor With Unplanned C/S   40y.o. yo [id number] [place]. [facility]. She had intermittent category II tracing and progressed to complete, but then had prolonged fetal deceleration.. The patient went for cesarean section due to Non-Reassuring FHR. Delivery details as follows: Membrane Rupture Time/Date: 6:27 AM ,01/19/2021   Delivery Method:C-Section, Low Transverse  Details of operation can be found in separate operative note. She is ambulating,tolerating a regular diet, passing flatus, and urinating well.  Patient is discharged home in stable condition 01/21/21.  Newborn Data: Birth date:01/19/2021  Birth time:4:10 PM  Gender:Female  Living status:Living  Apgars:8 ,9  Weight:2620 g   Magnesium Sulfate received: No BMZ received: No Rhophylac:N/A MMR:N/A T-DaP:Offered postpartum: was declined   Flu: Declined  Transfusion:No  Physical exam  Vitals:   01/20/21 0753 01/20/21 1316 01/20/21 2054 01/21/21 0544  BP: 107/61 119/67  109/73 121/79  Pulse: 82 88 91 94  Resp: _0 Temp: 98.3 F (36.8 C) 98.4 F (36.9 C) 98.2 F (36.8 C) 98.3 F (36.8 C)  TempSrc: Oral Oral Oral Oral  SpO2: 94%  95% 96%  Weight:      Height:       General: alert and cooperative Lochia: appropriate Uterine Fundus: firm Incision: Dressing is clean, dry, and intact DVT Evaluation: No evidence of DVT seen on physical exam. Negative Homan's sign. Labs: Lab Results  Component Value Date   WBC 24.8 (H) 01/20/2021   HGB 8.8 (L) 01/20/2021   HCT 27.1 (L) 01/20/2021   MCV 83.1 01/20/2021   PLT 174 01/20/2021   CMP Latest Ref Rng & Units 11/17/2019  Glucose 65 - 99 mg/dL 95  BUN 6 - 20 mg/dL 14  Creatinine 0.57 - 1.00 mg/dL 0.92  Sodium 134 - 144 mmol/L 137  Potassium 3.5 - 5.2 mmol/L 5.0  Chloride 96 - 106 mmol/L 101  CO2 20 - 29 mmol/L 23  Calcium 8.7 - 10.2 mg/dL 9.5  Total Protein 6.0 - 8.5 g/dL 6.8  Total Bilirubin 0.0 - 1.2 mg/dL <0.2  Alkaline Phos 39 - 117 IU/L 110  AST 0 - 40 IU/L 15  ALT 0 - 32 IU/L 14   Edinburgh Score: Edinburgh Postnatal Depression Scale Screening Tool 01/21/2021  I have been able to laugh and see the funny side of things. 0  I have looked forward with enjoyment to things. 0  I have blamed myself unnecessarily when things went wrong. 2  I have been anxious or worried for no good reason. 2  I have felt scared or panicky for no good reason. 1  Things have been getting on top of me. 1  I have been so unhappy that I have had difficulty sleeping. 2  I have felt sad or miserable. 1  I have been so unhappy that I have been crying. 1  The thought of harming myself has occurred to me. 0  Edinburgh Postnatal Depression Scale Total 10     After visit meds:  Allergies as of 01/21/2021       Reactions   Cephalosporins Anaphylaxis   Peanut-containing Drug Products Anaphylaxis   Penicillins Anaphylaxis        Medication List     STOP taking these medications    aspirin 81 MG chewable  tablet   loratadine 10 MG tablet Commonly known as: Claritin       TAKE these medications    acetaminophen 325 MG tablet Commonly known as: TYLENOL Take 650 mg by mouth every 6 (six) hours as needed for mild pain.   coconut oil Oil Apply 1 application topically as needed.   [place] 1 Units by Does not apply route as needed.   ferrous sulfate 325 (65 FE) MG tablet Take 325 mg by mouth every other day.   ibuprofen 600 MG tablet Commonly known as: ADVIL Take 1 tablet (600 mg total) by mouth every 6 (six) hours.   oxyCODONE 5 MG immediate release tablet Commonly known as: Oxy IR/ROXICODONE Take 1-2 tablets (5-10 mg total) by mouth every 4 (four) hours as needed for moderate pain.   PRENATAL VITAMINS PO Take 1 capsule by mouth daily.   senna-docusate 8.6-50 MG tablet Commonly known as: Senokot-S Take 2 tablets by mouth daily.   VITAMIN C PO Take 1 tablet by mouth daily.   VITAMIN D PO Take 1 tablet by mouth daily.               Discharge Care Instructions  (From admission, onward)           Start     Ordered   01/21/21 0000  Discharge wound care:       Comments: Please remove 5-7 days postop   01/21/21 0805             Discharge home in stable condition Infant Feeding: Bottle and Breast Infant Disposition:home with mother Discharge instruction: per After Visit Summary and Postpartum booklet. Activity: Advance as tolerated. Pelvic rest for 6 weeks.  Diet: routine diet Future Appointments: Future Appointments  Date Time Provider [place]  11/20/2021  9:30 AM Girtha Rm, NP-C PFM-PFM [facility]   Follow up Visit:   Please schedule this patient for a In person postpartum visit in 6 weeks with the following provider: Any provider. Additional Postpartum F/U:Incision check 1 week Low risk pregnancy complicated by:  failed TOLAC Delivery mode:  C-Section, Low Transverse  Anticipated Birth Control:   Unsure   01/21/2021 Erskine Emery, MD, PGY1   I saw and evaluated the patient. I agree with the findings and the plan of care as documented in the resident's note.  Sharene Skeans, MD [facility] Family Medicine Fellow, [facility] for [facility], [place]

## 2021-01-19 NOTE — Anesthesia Preprocedure Evaluation (Signed)
Anesthesia Evaluation  Patient identified by MRN, date of birth, ID band Patient awake    Reviewed: Allergy & Precautions, H&P , NPO status , Patient's Chart, lab work & pertinent test results  History of Anesthesia Complications Negative for: history of anesthetic complications  Airway Mallampati: II  TM Distance: >3 FB     Dental   Pulmonary neg pulmonary ROS, former smoker,    Pulmonary exam normal        Cardiovascular negative cardio ROS   Rhythm:regular Rate:Normal     Neuro/Psych Bipolar Disorder negative neurological ROS  negative psych ROS   GI/Hepatic negative GI ROS, Neg liver ROS,   Endo/Other  negative endocrine ROS  Renal/GU negative Renal ROS  negative genitourinary   Musculoskeletal  (+) Fibromyalgia -  Abdominal   Peds  Hematology negative hematology ROS (+)   Anesthesia Other Findings   Reproductive/Obstetrics (+) Pregnancy                             Anesthesia Physical Anesthesia Plan  ASA: 2  Anesthesia Plan: Epidural   Post-op Pain Management:    Induction:   PONV Risk Score and Plan:   Airway Management Planned:   Additional Equipment:   Intra-op Plan:   Post-operative Plan:   Informed Consent: I have reviewed the patients History and Physical, chart, labs and discussed the procedure including the risks, benefits and alternatives for the proposed anesthesia with the patient or authorized representative who has indicated his/her understanding and acceptance.       Plan Discussed with:   Anesthesia Plan Comments:         Anesthesia Quick Evaluation

## 2021-01-19 NOTE — Op Note (Addendum)
Operative Note   SURGERY DATE: 01/19/2021  PRE-OP DIAGNOSIS:  *Pregnancy at [redacted]w[redacted]d * Failed TOLAC *History one prior c section *Fetal Intolerance of Labor   POST-OP DIAGNOSIS: Same and Placental Abruption    PROCEDURE: repeat low transverse cesarean section via pfannenstiel skin incision with double layer uterine closure  SURGEON: Surgeon(s) and Role:    Janyth Pupa, DO - Primary    * Marsala, Placido Sou, MD - Fellow  ANESTHESIA: epidural  ESTIMATED BLOOD LOSS: 298  DRAINS: 172mL UOP via indwelling foley  TOTAL IV FLUIDS: 2544mL crystalloid  VTE PROPHYLAXIS: SCDs to bilateral lower extremities  ANTIBIOTICS: clindamycin, gentamicin, azithromycin, within 1 hour of skin incision  SPECIMENS: placenta to pathology  COMPLICATIONS: none  INDICATIONS: Patient arrived in spontaneous labor and elected to [facility]. She had a Category II strip that initially improved with amnioinfusion, terb, phenylephrine. She subsequently progressed to complete but then had prolonged deceleration, decision made to proceed w repeat LTCS.   FINDINGS: No intra-abdominal adhesions were noted. Grossly normal tubes and ovaries, normal uterus with large, pedunculated, fundal fibroid, clear amniotic fluid, cephalic female infant, weight pending, true knot in cord, small placental abruption, APGARs 8/9.  PROCEDURE IN DETAIL: The patient was taken to the operating room where anesthesia was administered and normal fetal heart tones were confirmed. She was then prepped and draped in the normal fashion in the dorsal supine position with a leftward tilt.  After a time out was performed, a pfannensteil  skin incision was made with the scalpel and carried through to the underlying layer of fascia. The fascia was then incised at the midline and this incision was extended laterally bluntly Attention was turned to the superior aspect of the fascial incision which was grasped with the kocher clamps x 2, tented up and the rectus  muscles were dissected off with the mayo scissors. In a similar fashion the inferior aspect of the fascial incision was grasped with the kocher clamps, tented up and the rectus muscles dissected off with the mayo scissors. The rectus muscles were then separated in the midline and the peritoneum was entered bluntly. The alexis retractor was inserted and the vesicouterine peritoneum was identified, tented up and entered with the metzenbaum scissors. This incision was extended laterally and the bladder flap was created digitally.    A low transverse hysterotomy was made with the scalpel until the endometrial cavity was breached and the amniotic sac ruptured with the Allis clamp, yielding clear amniotic fluid. This incision was extended bluntly and the infant's head, shoulders and body were delivered atraumatically.The cord was clamped x 2 and cut, and the infant was handed to the awaiting pediatricians, after delayed cord clamping was done.  The placenta was then gradually expressed from the uterus and then the uterus was cleared of all clots and debris. The hysterotomy was repaired with a running suture of 1-0 vicryl. A second imbricating layer of 1-0 vicryl suture was then placed. Several figure-of-eight sutures of 1-0 vicryl were added to achieve excellent hemostasis. Arrista placed over hysterotomy.   The peritoneum was closed with a running stitch of 3-0 Vicryl. The fascia was reapproximated with 0 Vicryl in a simple running fashion bilaterally. The subcutaneous layer was then reapproximated with interrupted sutures of 2-0 plain gut, and the skin was then closed with 4-0 vicryl, in a subcuticular fashion.  The patient  tolerated the procedure well. Sponge, lap, needle, and instrument counts were correct x 2. The patient was transferred to the recovery room awake,  alert and breathing independently in stable condition.   Sharene Skeans, MD OB Family Medicine Fellow, [facility] for [facility], [place] of Attending Supervision of Obstetric Fellow: Evaluation and management procedures were performed by the Obstetric Fellow under my supervision and collaboration.  I have reviewed the Obstetric Fellow's note and chart, and I agree with the management and plan. I have also made any necessary editorial changes.   Annalee Genta, DO Attending [place], [facility] for [facility], [place] [facility] 01/19/2021 7:14 PM

## 2021-01-19 NOTE — Anesthesia Procedure Notes (Signed)
Epidural Patient location during procedure: OB Start time: 01/19/2021 8:28 AM End time: 01/19/2021 8:38 AM  Staffing Anesthesiologist: Lidia Collum, MD Performed: anesthesiologist   Preanesthetic Checklist Completed: patient identified, IV checked, risks and benefits discussed, monitors and equipment checked, pre-op evaluation and timeout performed  Epidural Patient position: sitting Prep: DuraPrep Patient monitoring: heart rate, continuous pulse ox and blood pressure Approach: midline Location: L3-L4 Injection technique: LOR air  Needle:  Needle type: Tuohy  Needle gauge: 17 G Needle length: 9 cm Needle insertion depth: 6 cm Catheter type: closed end flexible Catheter size: 19 Gauge Catheter at skin depth: 11 cm Test dose: negative  Assessment Events: blood not aspirated, injection not painful, no injection resistance, no paresthesia and negative IV test  Additional Notes Reason for block:procedure for pain

## 2021-01-19 NOTE — H&P (Signed)
Obstetric History and Physical  Tiffany Juarez is a 40 y.o. [phone] with IUP at [redacted]w[redacted]d presenting in active labor and after SROM at 0630.  Patient states she has been having  regular, every 5 minutes contractions, minimal vaginal bleeding, with active fetal movement.   History of two SVDs followed by PLTCS in 2015 ar 35 weeks in setting of preeclampsia.  Had desired RCS for this delivery, but now considering TOLAC. No other concerns.   Prenatal Course Source of Care: [facility] Pregnancy complications or risks: Patient Active Problem List   Diagnosis Date Noted   Fibroid 09/12/2020   Supervision of other high risk pregnancy, antepartum, unspecified trimester 06/28/2020   AMA (advanced maternal age) multigravida 35+ 06/28/2020   Hx of preeclampsia, prior pregnancy, currently pregnant 06/28/2020   History of preterm delivery 06/28/2020   Smoker 11/17/2019   Obesity (BMI 30-39.9) 11/17/2019   Pure hypercholesterolemia 11/17/2019   Dry skin 11/17/2019   Night sweats 11/17/2019   Vitamin D deficiency 11/11/2018   Prediabetes 11/06/2018   Alteration consciousness 05/26/2018   Other chronic pain 05/26/2018   Sleep walking 05/15/2018   Bipolar disorder ([place])    Paresthesia 10/09/2017   Pain of both hip joints 10/09/2017   Nursing Staff Provider  Office Location [facility]  Dating  8wk [place]  Language  English  Anatomy US  normal  Flu Vaccine  Declined 06/28/20 Genetic Screen  NIPS: low risk female   AFP: normal Horizon: negative   TDaP Vaccine    Hgb A1C or  GTT Early A1C 6.0. Early 2hr Normal Third trimester - normal 2hr  COVID Vaccine    LAB RESULTS   Rhogam   N/a Blood Type O/Positive/-- (12/07 1004) O+  Feeding Plan Bottle and breast  Antibody Negative (12/07 1004)Neg  Contraception  Rubella 1.41 (12/07 1004)Immune  Circumcision Yes, if boy  RPR Non Reactive (05/02 0835) NR  Pediatrician   [facility] HBsAg Negative (12/07 1004) Neg  Support Person George(FOB)  HCVAb Negative  Prenatal  Classes No HIV Non Reactive (05/02 0835)     BTL Consent 12/01/20 GBS Negative/-- (06/24 0921)  VBAC Consent 01/19/21 Pap Normal 06/28/2020    Prenatal Transfer Tool  Maternal Diabetes: No Genetic Screening: Normal Maternal Ultrasounds/Referrals: Normal Fetal Ultrasounds or other Referrals:  None Maternal Substance Abuse:  No Significant Maternal Medications:  None Significant Maternal Lab Results: Group B Strep negative  Past Medical History:  Diagnosis Date   Anginal pain (HCC)    Bipolar disorder (HCC)    Depression    Fibroid    Fibromyalgia    Numbness and tingling    Prediabetes 11/06/2018    Past Surgical History:  Procedure Laterality Date   CESAREAN SECTION     wisdom teeth      OB History  Gravida Para Term Preterm AB Living  6 3 1 2 2 3   SAB IAB Ectopic Multiple Live Births  1 1     3     # Outcome Date GA Lbr Len/2nd Weight Sex Delivery Anes PTL Lv  6 Current           5 Preterm 10/06/13 [redacted]w[redacted]d   M CS-LTranv   LIV     Birth Comments: Preeclampsia  4 Term 08/03/04 [redacted]w[redacted]d  3062 g F Vag-Spont   LIV  3 Preterm 06/12/98 [redacted]w[redacted]d  1219 g M Vag-Spont  Y LIV     Birth Comments: Preterm Labor  2 SAB  1 IAB             Social History   Socioeconomic History   Marital status: Single    Spouse name: Not on file   Number of children: 3   Years of education: 12   Highest education level: High school graduate  Occupational History   Occupation: Unemployed  Tobacco Use   Smoking status: Every Day    Packs/day: 0.50    Pack years: 0.00    Types: Cigarettes   Smokeless tobacco: Never  Vaping Use   Vaping Use: Some days   Substances: Nicotine, CBD, Flavoring  Substance and Sexual Activity   Alcohol use: No    Comment: quit 2014   Drug use: No   Sexual activity: Yes    Birth control/protection: None  Other Topics Concern   Not on file  Social History Narrative   Lives at home with family.   Right-handed.   2 cups caffeine per day.   Social  Determinants of Health   Financial Resource Strain: Not on file  Food Insecurity: Not on file  Transportation Needs: Not on file  Physical Activity: Not on file  Stress: Not on file  Social Connections: Not on file    Family History  Problem Relation Age of Onset   Depression Father    Schizophrenia Father    Depression Brother    Healthy Mother    Hypertension Maternal Grandmother    Cancer Maternal Grandmother    Diabetes Maternal Aunt     Medications Prior to Admission  Medication Sig Dispense Refill Last Dose   Ascorbic Acid (VITAMIN C PO) Take by mouth.      aspirin 81 MG chewable tablet Chew by mouth daily.      Elastic Bandages & Supports (COMFORT FIT MATERNITY SUPP MED) MISC 1 Units by Does not apply route as needed. 1 each 0    ferrous sulfate 325 (65 FE) MG tablet Take 325 mg by mouth every other day.      loratadine (CLARITIN) 10 MG tablet Take 1 tablet (10 mg total) by mouth daily. 30 tablet 3    Prenatal Vit-Fe Fumarate-FA (PRENATAL VITAMINS PO) Take by mouth.      VITAMIN D PO Take by mouth.       Allergies  Allergen Reactions   Cephalosporins Anaphylaxis   Penicillins Anaphylaxis    Review of Systems: Negative except for what is mentioned in HPI.  Physical Exam: LMP 03/27/2020  CONSTITUTIONAL: Well-developed, well-nourished female in no acute distress.  NECK: Normal range of motion, supple, no masses SKIN: Skin is warm and dry. No rash noted. Not diaphoretic. No erythema. No pallor. NEUROLOGIC: Alert and oriented to person, place, and time. Normal reflexes, muscle tone coordination. No cranial nerve deficit noted. PSYCHIATRIC: Normal mood and affect. Normal behavior. Normal judgment and thought content. CARDIOVASCULAR: Normal heart rate noted, regular rhythm RESPIRATORY: Effort and breath sounds normal, no problems with respiration noted ABDOMEN: Soft, nontender, nondistended, gravid. MUSCULOSKELETAL: Normal range of motion. No edema and no tenderness.  2+ distal pulses.  Cervical Exam: Dilatation 6-7cm and vertex as per RN Presentation: Cephalic as per RM FHT:  Baseline rate 130 bpm   Variability moderate  Accelerations present   Decelerations early Contractions: Every 4-5 mins   Pertinent Labs/Studies:   No results found for this or any previous visit (from the past 24 hour(s)).  Assessment : Tiffany Juarez is a 40 y.o. [id number] at [redacted]w[redacted]d being admitted for labor  Plan:  Labor/TOLAC: Counseled regarding TOLAC vs RCS; risks/benefits discussed in detail. All questions answered.  Patient elects for TOLAC, consent signed 01/19/2021. Expectant management for now.  Analgesia as needed. FWB: Reassuring fetal heart tracing.  GBS negative Delivery plan: Hopeful for vaginal delivery/VBAC   Verita Schneiders, MD, [place] for [employer], [place]

## 2021-01-19 NOTE — MAU Note (Signed)
Patient arrived to MAU c/o ctx that started at 5am and SROM at 630am. Positive fetal movement reported. Clear pinkish fluid noted.

## 2021-01-19 NOTE — Progress Notes (Signed)
Labor Progress Note Tiffany Juarez is a 40 y.o. [phone] at [redacted]w[redacted]d presented for labor, hx of prior cesarean section  S: Feeling some pressure intermittently   O:  BP (!) 118/54   Pulse 94   Temp 98.1 F (36.7 C) (Oral)   Resp 18   Ht 5\' 3"  (1.6 m)   Wt 81.2 kg   LMP 03/27/2020   SpO2 99%   BMI 31.71 kg/m   EFM: baseline 125/mod variability/pos accels/no decels   CVE: Dilation: 8 Effacement (%): 80 Cervical Position: Anterior Station: -2 Presentation: Vertex Exam by:: Dr Berniece Andreas   A&P: 40 y.o. [id number] [redacted]w[redacted]d presented after SROM at home, in labor. Consented for VBAC on arrival to L&D.   #TOLAC/Labor: s/p SROM 0630. Cat II tracing now improved s/p interventions earlier. IUPC/FSE in place. Has now made change to 8 cm. After discussion, patient amenable to starting pitocin to improve contraction pattern. Continue to monitor.   #Pain: epidural in place  #FWB: cat I at this time  #GBS negative  Janet Berlin, MD 1:44 PM

## 2021-01-19 NOTE — Progress Notes (Addendum)
Brief Update:  Patient progressed to complete, however has had persistent category II strip despite interventions and is still 0 station. Given high fetal station and nonreassuring FHT, decision made to proceed with repeat LTCS.   The risks of surgery were discussed with the patient including but were not limited to: bleeding which may require transfusion or reoperation; infection which may require antibiotics; injury to bowel, bladder, ureters or other surrounding organs; injury to the fetus; need for additional procedures including hysterectomy in the event of a life-threatening hemorrhage; formation of adhesions; placental abnormalities with subsequent pregnancies; incisional problems; thromboembolic phenomenon and other postoperative/anesthesia complications.  The patient concurred with the proposed plan, giving informed written consent for the procedure.   Patient has been NPO since 1AM she will remain NPO for procedure. Anesthesia and OR aware. Preoperative prophylactic antibiotics and SCDs ordered on call to the OR.  To OR when ready.    Sharene Skeans, MD OB Family Medicine Fellow, [facility] for [facility], [place] [facility]  Risk benefits and alternatives of cesarean section were discussed with the patient including but not limited to infection, bleeding, damage to bowel , bladder and baby with the need for further surgery. Pt voiced understanding and desires to proceed.    Janyth Pupa, DO Attending [place], [facility] for [employer], [place]

## 2021-01-19 NOTE — Transfer of Care (Signed)
Immediate Anesthesia Transfer of Care Note  Patient: Tiffany Juarez  Procedure(s) Performed: CESAREAN SECTION  Patient Location: PACU  Anesthesia Type:Epidural  Level of Consciousness: awake  Airway & Oxygen Therapy: Patient Spontanous Breathing  Post-op Assessment: Report given to RN and Post -op Vital signs reviewed and stable  Post vital signs: Reviewed and stable  Last Vitals:  Vitals Value Taken Time  BP 100/85 01/19/21 1715  Temp    Pulse 121 01/19/21 1719  Resp 19 01/19/21 1719  SpO2 100 % 01/19/21 1719  Vitals shown include unvalidated device data.  Last Pain:  Vitals:   01/19/21 1500  TempSrc: Oral  PainSc:       Patients Stated Pain Goal: 0 (03/54/65 6812)  Complications: No notable events documented.

## 2021-01-20 LAB — CBC
HCT: 27.1 % — ABNORMAL LOW (ref 36.0–46.0)
Hemoglobin: 8.8 g/dL — ABNORMAL LOW (ref 12.0–15.0)
MCH: 27 pg (ref 26.0–34.0)
MCHC: 32.5 g/dL (ref 30.0–36.0)
MCV: 83.1 fL (ref 80.0–100.0)
Platelets: 174 10*3/uL (ref 150–400)
RBC: 3.26 MIL/uL — ABNORMAL LOW (ref 3.87–5.11)
RDW: 16.2 % — ABNORMAL HIGH (ref 11.5–15.5)
WBC: 24.8 10*3/uL — ABNORMAL HIGH (ref 4.0–10.5)
nRBC: 0 % (ref 0.0–0.2)

## 2021-01-20 MED ORDER — SODIUM CHLORIDE 0.9 % IV SOLN
500.0000 mg | Freq: Once | INTRAVENOUS | Status: AC
  Administered 2021-01-20: 500 mg via INTRAVENOUS
  Filled 2021-01-20: qty 25

## 2021-01-20 NOTE — Progress Notes (Addendum)
Post-Op Day 1, repeat CS for  failed TOLAC  Subjective: No complaints, up ad lib, voiding and tolerating PO, passing flatus,small lochia,.plans to breastfeed, vasectomy  Objective: Blood pressure 107/61, pulse 82, temperature 98.3 F (36.8 C), temperature source Oral, resp. rate 20, height 5\' 3"  (1.6 m), weight 81.2 kg, last menstrual period 03/27/2020, SpO2 94 %, unknown if currently breastfeeding.  Physical Exam:  General: alert, cooperative and no distress Lochia:normal flow Chest: CTAB Heart: RRR no m/r/g Abdomen: +BS, soft, nontender, dsg c/d/intact, no erythema Uterine Fundus: firm DVT Evaluation: No evidence of DVT seen on physical exam. Extremities: trace edema  Recent Labs    01/19/21 0740 01/20/21 0527  HGB 12.7 8.8*  HCT 39.3 27.1*    Assessment/Plan: Circumcision prior to discharge and Contraception plans vasectomy IV venofer   Circumcision Consent  Discussed with mom at bedside about circumcision.   Circumcision is a surgery that removes the skin that covers the tip of the penis, called the "foreskin." Circumcision is usually done when a boy is between 49 and 83 days old, sometimes up to 1-76 weeks old.  The most common reasons boys are circumcised include for cultural/religious beliefs or for parental preference (potentially easier to clean, so baby looks like daddy, etc).  There may be some medical benefits for circumcision:   Circumcised boys seem to have slightly lower rates of: ? Urinary tract infections (per the American Academy of Pediatrics an uncircumcised boy has a 1/100 chance of developing a UTI in the first year of life, a circumcised boy at a 07/998 chance of developing a UTI in the first year of life- a 10% reduction) ? Penis cancer (typically rare- an uncircumcised female has a 1 in 100,000 chance of developing cancer of the penis) ? Sexually transmitted infection (in endemic areas, including HIV, HPV and Herpes- circumcision does NOT protect  against gonorrhea, chlamydia, trachomatis, or syphilis) ? Phimosis: a condition where that makes retraction of the foreskin over the glans impossible (0.4 per 1000 boys per year or 0.6% of boys are affected by their 15th birthday)  Boys and men who are not circumcised can reduce these extra risks by: ? Cleaning their penis well ? Using condoms during sex  What are the risks of circumcision?  As with any surgical procedure, there are risks and complications. In circumcision, complications are rare and usually minor, the most common being: ? Bleeding- risk is reduced by holding each clamp for 30 seconds prior to a cut being made, and by holding pressure after the procedure is done ? Infection- the penis is cleaned prior to the procedure, and the procedure is done under sterile technique ? Damage to the urethra or amputation of the penis  How is circumcision done in baby boys?  The baby will be placed on a special table and the legs restrained for their safety. Numbing medication is injected into the penis, and the skin is cleansed with betadine to decrease the risk of infection.   What to expect:  The penis will look red and raw for 5-7 days as it heals. We expect scabbing around where the cut was made, as well as clear-pink fluid and some swelling of the penis right after the procedure. If your baby's circumcision starts to bleed or develops pus, please contact your pediatrician immediately.  All questions were answered and mother consented.  Christin Fudge Obstetrics Fellow   LOS: 1 day   Christin Fudge 01/20/2021, 7:55 AM

## 2021-01-20 NOTE — Clinical Social Work Maternal (Signed)
CLINICAL SOCIAL WORK MATERNAL/CHILD NOTE  Patient Details  Name: Tiffany Juarez MRN: [id number] Date of Birth: 1980/09/08  Date:  01/20/2021  Clinical Social Worker Initiating Note:  Darra Lis, [place] Date/Time: Initiated:  01/20/21/0945     Child's Name:  Tiffany Juarez   Biological Parents:  Mother, Father Mart Piggs)   Need for Interpreter:  None   Reason for Referral:  Behavioral Health Concerns   Address:  [place] [address]    Phone number:  [phone] (home)     Additional phone number:   Household Members/Support Persons (HM/SP):   Household Member/Support Person 1, Household Member/Support Person 2, Household Member/Support Person 3   HM/SP Name Relationship DOB or Age  HM/SP -[place] Significant Other 09/10/1967  HM/SP -2 Clydia [place]ano Daughter 08/03/2004  HM/SP -3 Hurshel Keys Son 09/26/2013  HM/SP -4        HM/SP -5        HM/SP -6        HM/SP -7        HM/SP -8          Natural Supports (not living in the home):  Spouse/significant other   Professional Supports: Transport planner, Other (Comment) Heritage manager)   Employment: Unemployed [employer] [place] full-time  Type of Work:     Education:  [employer] school graduate   Homebound arranged:    Museum/gallery curator Resources:  [employer]   Other Resources:  Physicist, medical  , [place] Considerations Which May Impact Care:    Strengths:  Ability to meet basic needs  , Engineer, materials, Home prepared for child  , Psychotropic Medications   Psychotropic Medications:  Klonopin, Latuda      Pediatrician:    Careers adviser area  Pediatrician List:   [place]      Pediatrician Fax Number:    Risk Factors/Current Problems:  None   Cognitive State:  Able to Concentrate  , Goal Oriented  , Insightful  , Linear Thinking     Mood/Affect:  Bright  , Calm  , Interested  ,  Comfortable     CSW Assessment: CSW consulted for history of bipolar. CSW met with MOB to assess and offer support. CSW introduced self and role. CSW observed MOB holding infant. MOB was pleasant, welcoming and open with CSW. CSW informed MOB of reason for consult and assessed current emotions. MOB reported she is currently doing okay, however the pregnancy was "awful." MOB expressed she experienced depression during the pregnancy, which she attributed to not being on regular medications. MOB stated prior to pregnancy she was taking Latuda, Klonopin, as well as other medications to help address depressive symptoms. MOB denies currently feeling any depressive symptoms. CSW asked MOB how she coped with the depression during the pregnancy. MOB stated she continued to work and complete regular daily task. CSW asked MOB about her mental health diagnosis. MOB disclosed she was diagnosed with bipolar manic and depression in 2001. MOB was unable to recall the last time there was a manic episode, but reported it was not anytime recently. MOB reported she meets with her psychiatrist Dr. Casimiro Needle every 3-6 months, and meets in two weeks to restart medications. MOB also attends therapy with [facility] once a month. MOB shared therapy is helpful depending on the situation and the psychiatrist is  very helpful in managing symptoms. MOB identified FOB as a primary support and denies any current SI, HI or being involved in DV. MOB reported no mental health concerns at this time.   CSW provided education regarding the baby blues period versus PPD. CSW provided the New Mom Checklist and encouraged MOB to self evaluate and contact a medical professional if symptoms are noted at any time.   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 MOB stated it presented as intrusive thoughts about all of her children and anger, lasting about two years. MOB reported  medications were not helpful at that time and that the symptoms subsided once she began therapy. MOB stated she was able to identify that the thoughts were intrusive and that she feels comfortable speaking to a professional if symptoms arise again postpartum.   CSW provided review of Sudden Infant Death Syndrome (SIDS) precautions. MOB has everything needed for infant. MOB denies any barriers to follow-up care. MOB denies having any additional needs at this time.  CSW identifies no further need for intervention and no barriers to discharge at this time.  CSW Plan/Description:  No Further Intervention Required/No Barriers to Discharge, Perinatal Mood and Anxiety Disorder (PMADs) Education, Sudden Infant Death Syndrome (SIDS) Education, Other Engineer, mining, Other Information/Referral to [employer], [place] 01/20/2021, 11:08 AM

## 2021-01-21 ENCOUNTER — Encounter (HOSPITAL_COMMUNITY): Payer: Self-pay | Admitting: Obstetrics & Gynecology

## 2021-01-21 MED ORDER — SENNOSIDES-DOCUSATE SODIUM 8.6-50 MG PO TABS: 2.0000 | ORAL_TABLET | Freq: Every day | ORAL | 0 refills | Status: DC

## 2021-01-21 MED ORDER — IBUPROFEN 600 MG PO TABS: 600.0000 mg | ORAL_TABLET | Freq: Four times a day (QID) | ORAL | 0 refills | Status: DC

## 2021-01-21 MED ORDER — SODIUM CHLORIDE 0.9 % IV SOLN: INTRAVENOUS | Status: DC | PRN

## 2021-01-21 MED ORDER — OXYCODONE HCL 5 MG PO TABS: 5.0000 mg | ORAL_TABLET | ORAL | 0 refills | Status: DC | PRN

## 2021-01-21 MED ORDER — COCONUT OIL OIL: 1.0000 "application " | TOPICAL_OIL | 0 refills | Status: DC | PRN

## 2021-01-24 LAB — SURGICAL PATHOLOGY

## 2021-01-25 ENCOUNTER — Encounter: Payer: Medicare Other | Admitting: Family Medicine

## 2021-01-27 ENCOUNTER — Encounter: Payer: Self-pay | Admitting: Family Medicine

## 2021-01-27 ENCOUNTER — Other Ambulatory Visit: Payer: Self-pay

## 2021-01-27 ENCOUNTER — Ambulatory Visit (INDEPENDENT_AMBULATORY_CARE_PROVIDER_SITE_OTHER): Payer: Medicare Other | Admitting: Family Medicine

## 2021-01-27 NOTE — Progress Notes (Signed)
   Subjective:    Patient ID: Tiffany Juarez, female    DOB: [birth year], 40 y.o.   MRN: [id number]  HPI  Seen for incision check - no problems, still some numbness. Hasn't removed honeycomb dressing.  Review of Systems     Objective:   Physical Exam Vitals reviewed.  Constitutional:      Appearance: Normal appearance.  Abdominal:     Comments: Incision clean dry and intact  Neurological:     Mental Status: She is alert.      Assessment & Plan:   1. Cesarean delivery delivered Appears to be healing well. No concerns.

## 2021-01-30 ENCOUNTER — Other Ambulatory Visit (HOSPITAL_COMMUNITY): Payer: Medicare Other

## 2021-01-30 ENCOUNTER — Telehealth (HOSPITAL_COMMUNITY): Payer: Self-pay | Admitting: *Deleted

## 2021-01-30 ENCOUNTER — Encounter (HOSPITAL_COMMUNITY): Admission: RE | Admit: 2021-01-30 | Payer: Medicare Other | Source: Ambulatory Visit

## 2021-01-30 DIAGNOSIS — F3131 Bipolar disorder, current episode depressed, mild: Secondary | ICD-10-CM | POA: Diagnosis not present

## 2021-01-30 NOTE — Telephone Encounter (Signed)
Unable to leave message due to full mailbox. Odis Hollingshead, RN 01/30/2021 at 1:35pm

## 2021-01-31 ENCOUNTER — Inpatient Hospital Stay (HOSPITAL_COMMUNITY): Admit: 2021-01-31 | Payer: Medicare Other | Admitting: Family Medicine

## 2021-02-15 ENCOUNTER — Encounter: Payer: Self-pay | Admitting: Medical

## 2021-02-15 ENCOUNTER — Other Ambulatory Visit: Payer: Self-pay

## 2021-02-15 ENCOUNTER — Ambulatory Visit (INDEPENDENT_AMBULATORY_CARE_PROVIDER_SITE_OTHER): Payer: Medicare Other | Admitting: Medical

## 2021-02-15 VITALS — BP 110/70 | HR 88 | Temp 97.4°F | Wt 164.6 lb

## 2021-02-15 DIAGNOSIS — G5603 Carpal tunnel syndrome, bilateral upper limbs: Secondary | ICD-10-CM | POA: Diagnosis not present

## 2021-02-15 DIAGNOSIS — R200 Anesthesia of skin: Secondary | ICD-10-CM | POA: Insufficient documentation

## 2021-02-15 DIAGNOSIS — M25532 Pain in left wrist: Secondary | ICD-10-CM | POA: Diagnosis not present

## 2021-02-15 DIAGNOSIS — M25531 Pain in right wrist: Secondary | ICD-10-CM

## 2021-02-15 HISTORY — DX: Pain in right wrist: M25.531

## 2021-02-15 HISTORY — DX: Anesthesia of skin: R20.0

## 2021-02-15 NOTE — Progress Notes (Signed)
Subjective:  Tiffany Juarez is a 40 y.o. female who presents for Chief Complaint  Patient presents with   Numbness    Both hands and felt like she had paper cuts on some of her fingers     Here for c/o numbness in fingertips.  Left middle finger is numb, but feels numb in fingetips of left fingers except pinky, right hand fingertips numb 1st three fingers.  Yesterday was having paper cut senstaoin on some of her fingers.  Gets some pain going up her left arm, from wrist to upper arm  Has had intermittent problem with numbness and pains in wrists prior for at least 20 years.  Just had a baby in June and these symtpoms were worse during recent pregnancy.  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   No prior nerve studies.   Has seen neurologist in past but not for these symptos.   Saw neurology for pain in past.  No prior orthopedist eval  Breast feeding  No other aggravating or relieving factors.    No other c/o.  Past Medical History:  Diagnosis Date   Anginal pain ([place])    Bipolar disorder ([place])    Depression    Fibroid    Fibromyalgia    Numbness and tingling    Prediabetes 11/06/2018   Current Outpatient Medications on File Prior to Visit  Medication Sig Dispense Refill   ibuprofen (ADVIL) 600 MG tablet Take 1 tablet (600 mg total) by mouth every 6 (six) hours. 30 tablet 0   Prenatal Vit-Fe Fumarate-FA (PRENATAL VITAMINS PO) Take 1 capsule by mouth daily.     VITAMIN D PO Take 1 tablet by mouth daily.     acetaminophen (TYLENOL) 325 MG tablet Take 650 mg by mouth every 6 (six) hours as needed for mild pain. (Patient not taking: No sig reported)     Ascorbic Acid (VITAMIN C PO) Take 1 tablet by mouth daily. (Patient not taking: No sig reported)     coconut oil OIL Apply 1 application topically as needed. (Patient not taking: No sig reported)  0   Elastic Bandages & Supports (COMFORT FIT MATERNITY SUPP MED) MISC 1 Units by Does not apply route as needed.  (Patient not taking: No sig reported) 1 each 0   ferrous sulfate 325 (65 FE) MG tablet Take 325 mg by mouth every other day. (Patient not taking: Reported on 02/15/2021)     oxyCODONE (OXY IR/ROXICODONE) 5 MG immediate release tablet Take 1-2 tablets (5-10 mg total) by mouth every 4 (four) hours as needed for moderate pain. (Patient not taking: Reported on 02/15/2021) 15 tablet 0   senna-docusate (SENOKOT-S) 8.6-50 MG tablet Take 2 tablets by mouth daily. (Patient not taking: No sig reported) 30 tablet 0   No current facility-administered medications on file prior to visit.     The following portions of the patient's history were reviewed and updated as appropriate: allergies, current medications, past family history, past medical history, past social history, past surgical history and problem list.  ROS Otherwise as in subjective above  Objective: BP 110/70   Pulse 88   Temp (!) 97.4 F (36.3 C)   Wt 164 lb 9.6 oz (74.7 kg)   SpO2 98%   BMI 29.16 kg/m   General appearance: alert, no distress, well developed, well nourished, African-American female HEENT: normocephalic, sclerae anicteric, conjunctiva pink and moist, TMs pearly, nares patent, no discharge or erythema, pharynx normal Neck: supple,  nontender, normal range of motion no lymphadenopathy, no thyromegaly, no masses, no bruits Heart: RRR, normal S1, S2, no murmurs Lungs: CTA bilaterally, no wheezes, rhonchi, or rales Pulses: 2+ radial pulses, 2+ pedal pulses, normal cap refill Ext: no edema Tender over bilateral wrist in general, otherwise hands and arms nontender with normal range of motion of arms except for the wrist which are reduced with flexion and extension mildly due to pain.  No obvious joint swelling or deformity There is a subtle decrease in strength on the left hand and fingers and wrist compared to the right but still 4-5 out of 5 throughout fingers and wrist.  Positive Tinel's and Phalen's on the left.  Rest of arm  sensation and strength normal    Assessment: Encounter Diagnoses  Name Primary?   Bilateral carpal tunnel syndrome Yes   Pain in both wrists    Finger numbness    Mother currently breast-feeding      Plan: We discussed symptoms and concerns and differential.  This seems like bilateral carpal tunnel syndrome worse on the left.  Urgent referral to orthopedics since we are trying to limit oral medication since she is breast-feeding.  I advise she go ahead and begin over-the-counter reinforced wrist plus bilaterally.  Tylenol as needed.  Relative rest as possible  Loura was seen today for numbness.  Diagnoses and all orders for this visit:  Bilateral carpal tunnel syndrome -     AMB referral to orthopedics  Pain in both wrists -     AMB referral to orthopedics  Finger numbness -     AMB referral to orthopedics  Mother currently breast-feeding -     AMB referral to orthopedics   Follow up: Pending orthopedic referral

## 2021-02-28 DIAGNOSIS — F3131 Bipolar disorder, current episode depressed, mild: Secondary | ICD-10-CM | POA: Diagnosis not present

## 2021-03-02 DIAGNOSIS — R200 Anesthesia of skin: Secondary | ICD-10-CM | POA: Diagnosis not present

## 2021-03-03 ENCOUNTER — Ambulatory Visit (INDEPENDENT_AMBULATORY_CARE_PROVIDER_SITE_OTHER): Payer: Medicare Other | Admitting: Family Medicine

## 2021-03-03 ENCOUNTER — Other Ambulatory Visit: Payer: Self-pay

## 2021-03-03 DIAGNOSIS — F319 Bipolar disorder, unspecified: Secondary | ICD-10-CM | POA: Diagnosis not present

## 2021-03-03 NOTE — Progress Notes (Signed)
[place] Partum Visit Note  Tiffany Juarez is a 40 y.o. [phone] female who presents for a postpartum visit. She is 6 weeks postpartum following a repeat cesarean section.  I have fully reviewed the prenatal and intrapartum course. The delivery was at 32 gestational weeks.  Anesthesia: epidural. Postpartum course has been normal. Baby is doing well. Baby is feeding by  [employer] . Bleeding red. Bowel function is normal. Bladder function is normal. Patient is not sexually active. Contraception method is none. Postpartum depression screening: positive.   The pregnancy intention screening data noted above was reviewed. Potential methods of contraception were discussed. The patient elected to proceed with No data recorded.   Edinburgh Postnatal Depression Scale - 03/03/21 1044       Edinburgh Postnatal Depression Scale:  In the Past 7 Days   I have been able to laugh and see the funny side of things. 0    I have looked forward with enjoyment to things. 1    I have blamed myself unnecessarily when things went wrong. 2    I have been anxious or worried for no good reason. 3    I have felt scared or panicky for no good reason. 2    Things have been getting on top of me. 2    I have been so unhappy that I have had difficulty sleeping. 1    I have felt sad or miserable. 1    I have been so unhappy that I have been crying. 1    The thought of harming myself has occurred to me. 0    Edinburgh Postnatal Depression Scale Total 13             Health Maintenance Due  Topic Date Due   COVID-19 Vaccine (1) Never done   Pneumococcal Vaccine 52-74 Years old (1 - PCV) Never done   INFLUENZA VACCINE  02/20/2021    The following portions of the patient's history were reviewed and updated as appropriate: allergies, current medications, past family history, past medical history, past social history, past surgical history, and problem list.  Review of Systems Pertinent items are noted in  HPI.  Objective:  BP 119/80   Pulse 85   Wt 166 lb (75.3 kg)   BMI 29.41 kg/m    General:  alert, cooperative, and no distress  Lungs: clear to auscultation bilaterally  Heart:  regular rate and rhythm, S1, S2 normal, no murmur, click, rub or gallop  Abdomen: soft, non-tender; bowel sounds normal; no masses,  no organomegaly   Wound well approximated incision  GU exam:  not indicated       Assessment:   1. Postpartum care and examination   2. Bipolar affective disorder, remission status unspecified ([place])      Plan:   Essential components of care per ACOG recommendations:  1.  Mood and well being: Patient with positive depression screening today. Reviewed local resources for support. She is currently under the care of psychiatry - Patient tobacco use? No.   - hx of drug use? No.    2. Infant care and feeding:  -Patient currently breastmilk feeding? No.  -Social determinants of health (SDOH) reviewed in EPIC. No concerns  3. Sexuality, contraception and birth spacing - Patient does not want a pregnancy in the next year.  \ - Reviewed forms of contraception in tiered fashion. Patient desired condoms today.   - Discussed birth spacing of 18 months  4. Sleep and fatigue -  Encouraged family/partner/community support of 4 hrs of uninterrupted sleep to help with mood and fatigue  5. Physical Recovery  - Discussed patients delivery and complications. She describes her labor as good. - Patient had a C-section.  - Patient has urinary incontinence? No. - Patient is safe to resume physical and sexual activity  6.  Health Maintenance - HM due items addressed - N/A - Last pap smear  Diagnosis  Date Value Ref Range Status  06/28/2020   Final   - Negative for intraepithelial lesion or malignancy (NILM)   Pap smear not done at today's visit.  -Breast Cancer screening indicated? No.   7. Chronic Disease/Pregnancy Condition follow up: None  - PCP follow up  [facility] [facility]

## 2021-04-04 DIAGNOSIS — F3131 Bipolar disorder, current episode depressed, mild: Secondary | ICD-10-CM | POA: Diagnosis not present

## 2021-04-06 DIAGNOSIS — R200 Anesthesia of skin: Secondary | ICD-10-CM | POA: Diagnosis not present

## 2021-04-18 DIAGNOSIS — F3131 Bipolar disorder, current episode depressed, mild: Secondary | ICD-10-CM | POA: Diagnosis not present

## 2021-05-24 DIAGNOSIS — F3131 Bipolar disorder, current episode depressed, mild: Secondary | ICD-10-CM | POA: Diagnosis not present

## 2021-05-30 DIAGNOSIS — F3131 Bipolar disorder, current episode depressed, mild: Secondary | ICD-10-CM | POA: Diagnosis not present

## 2021-05-31 ENCOUNTER — Ambulatory Visit (INDEPENDENT_AMBULATORY_CARE_PROVIDER_SITE_OTHER): Payer: Medicare Other | Admitting: Family Medicine

## 2021-05-31 ENCOUNTER — Encounter: Payer: Self-pay | Admitting: Family Medicine

## 2021-05-31 ENCOUNTER — Other Ambulatory Visit (HOSPITAL_COMMUNITY)
Admission: RE | Admit: 2021-05-31 | Discharge: 2021-05-31 | Disposition: A | Discharge status: Home / Self Care | Payer: Medicare Other | Source: Ambulatory Visit | Attending: Family Medicine | Admitting: Family Medicine

## 2021-05-31 ENCOUNTER — Other Ambulatory Visit: Payer: Self-pay

## 2021-05-31 VITALS — BP 116/74 | HR 75 | Wt 169.0 lb

## 2021-05-31 DIAGNOSIS — R102 Pelvic and perineal pain: Secondary | ICD-10-CM

## 2021-05-31 DIAGNOSIS — N815 Vaginal enterocele: Secondary | ICD-10-CM | POA: Diagnosis not present

## 2021-05-31 DIAGNOSIS — K469 Unspecified abdominal hernia without obstruction or gangrene: Secondary | ICD-10-CM

## 2021-05-31 LAB — POCT URINALYSIS DIPSTICK
Bilirubin, UA: NEGATIVE
Blood, UA: NEGATIVE
Glucose, UA: NEGATIVE
Ketones, UA: NEGATIVE
Leukocytes, UA: NEGATIVE
Nitrite, UA: NEGATIVE
Protein, UA: NEGATIVE
Spec Grav, UA: 1.02 (ref 1.010–1.025)
Urobilinogen, UA: 0.2 E.U./dL
pH, UA: 5 (ref 5.0–8.0)

## 2021-05-31 LAB — POCT URINE PREGNANCY: Preg Test, Ur: NEGATIVE

## 2021-05-31 NOTE — Progress Notes (Signed)
   Subjective:    Patient ID: Tiffany Juarez, female    DOB: 1981-07-22, 40 y.o.   MRN: [id number]  HPI Patient seen for intermittent lower pelvic pain that started 3 to 4 days ago.  Pain feels like moderate contractions that last for 2 to 3 minutes, then subsides.  She will have this periodically throughout the day.  She is uncomfortable with movement, but pain is not generated by movement.  She is sexually active with no new partners.  No birth control at this point.  She denies abnormal discharge, bleeding, dysuria, frequency.   Review of Systems  Constitutional:  Negative for chills, fatigue and fever.  Genitourinary:  Negative for difficulty urinating, frequency, urgency, vaginal bleeding, vaginal discharge and vaginal pain.      Objective:   Physical Exam Vitals reviewed. Exam conducted with a chaperone present.  Constitutional:      Appearance: Normal appearance.  HENT:     Head: Normocephalic and atraumatic.  Cardiovascular:     Rate and Rhythm: Normal rate and regular rhythm.  Pulmonary:     Effort: Pulmonary effort is normal.     Breath sounds: Normal breath sounds.  Abdominal:     General: Abdomen is flat.     Palpations: Abdomen is soft.     Hernia: There is no hernia in the left inguinal area or right inguinal area.  Genitourinary:    Labia:        Right: No rash, tenderness or lesion.        Left: No rash, tenderness or lesion.      Urethra: No prolapse or urethral swelling.     Vagina: Normal. No vaginal discharge, erythema, tenderness or bleeding.     Cervix: No cervical motion tenderness, discharge, friability, lesion or erythema.     Comments: There is a moderate bulge in the left vaginal wall with a 1.5-2cm defect in the mucosal tissue of the vaginal wall. The defect is non-communicating with the abdominal cavity and is non-tender. Lymphadenopathy:     Lower Body: No right inguinal adenopathy. No left inguinal adenopathy.  Skin:    General: Skin is warm and dry.      Capillary Refill: Capillary refill takes less than 2 seconds.  Neurological:     General: No focal deficit present.     Mental Status: She is alert.  Psychiatric:        Mood and Affect: Mood normal.        Behavior: Behavior normal.        Thought Content: Thought content normal.        Judgment: Judgment normal.       Assessment & Plan:  1. Pelvic pain Check [place] and cultures.  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 No communication with abdominal cavity. No pain or other problems. Will see if [place] can evaluate during exam.

## 2021-05-31 NOTE — Progress Notes (Signed)
Pt presents with lower abdominal pain x 3 days.  Tiffany Juarez l Tiffany Juarez, CMA

## 2021-06-01 LAB — CERVICOVAGINAL ANCILLARY ONLY
Bacterial Vaginitis (gardnerella): NEGATIVE
Candida Glabrata: NEGATIVE
Candida Vaginitis: NEGATIVE
Chlamydia: NEGATIVE
Comment: NEGATIVE
Comment: NEGATIVE
Comment: NEGATIVE
Comment: NEGATIVE
Comment: NEGATIVE
Comment: NORMAL
Neisseria Gonorrhea: NEGATIVE
Trichomonas: NEGATIVE

## 2021-06-06 ENCOUNTER — Ambulatory Visit (HOSPITAL_BASED_OUTPATIENT_CLINIC_OR_DEPARTMENT_OTHER): Payer: Medicare Other

## 2021-06-12 ENCOUNTER — Other Ambulatory Visit: Payer: Self-pay

## 2021-06-12 ENCOUNTER — Ambulatory Visit (HOSPITAL_BASED_OUTPATIENT_CLINIC_OR_DEPARTMENT_OTHER)
Admission: RE | Admit: 2021-06-12 | Discharge: 2021-06-12 | Disposition: A | Discharge status: Home / Self Care | Payer: Medicare Other | Source: Ambulatory Visit | Attending: Family Medicine | Admitting: Family Medicine

## 2021-06-12 DIAGNOSIS — R102 Pelvic and perineal pain: Secondary | ICD-10-CM | POA: Diagnosis not present

## 2021-06-12 DIAGNOSIS — D259 Leiomyoma of uterus, unspecified: Secondary | ICD-10-CM | POA: Diagnosis not present

## 2021-06-21 ENCOUNTER — Ambulatory Visit (INDEPENDENT_AMBULATORY_CARE_PROVIDER_SITE_OTHER): Payer: Medicare Other | Admitting: Family Medicine

## 2021-06-21 ENCOUNTER — Encounter: Payer: Self-pay | Admitting: Family Medicine

## 2021-06-21 ENCOUNTER — Other Ambulatory Visit: Payer: Self-pay

## 2021-06-21 VITALS — BP 123/80 | HR 87 | Resp 16 | Ht 60.0 in | Wt 171.0 lb

## 2021-06-21 DIAGNOSIS — L905 Scar conditions and fibrosis of skin: Secondary | ICD-10-CM | POA: Diagnosis not present

## 2021-06-21 DIAGNOSIS — R52 Pain, unspecified: Secondary | ICD-10-CM

## 2021-06-21 DIAGNOSIS — K469 Unspecified abdominal hernia without obstruction or gangrene: Secondary | ICD-10-CM | POA: Diagnosis not present

## 2021-06-21 MED ORDER — IBUPROFEN 600 MG PO TABS: 600.0000 mg | ORAL_TABLET | Freq: Four times a day (QID) | ORAL | 1 refills | Status: AC | PRN

## 2021-06-21 NOTE — Progress Notes (Signed)
   Subjective:    Patient ID: Tiffany Juarez, female    DOB: 08-Nov-1980, 40 y.o.   MRN: [id number]  HPI Patient seen for follow up of pelvic pain and ultrasound. The patient's pelvic pain has resolved. Now she reports pain on her abdominal wall around the right corner of the cesarean scar that started 10 days ago. Pain is constant and dull, increases when she brushes up against it. She did feel a lump there initially, but no redness of the skin over that area. The lump has decreased in size, but pain unchanged.   Review of Systems     Objective:   Physical Exam Vitals reviewed.  Constitutional:      Appearance: Normal appearance.  Cardiovascular:     Rate and Rhythm: Normal rate and regular rhythm.     Pulses: Normal pulses.  Pulmonary:     Effort: Pulmonary effort is normal.  Abdominal:     General: Abdomen is flat.     Palpations: Abdomen is soft.     Tenderness: There is abdominal tenderness (tender at right corner of cesaeran scar. No erythema to skin. Small 1cm nodule palpated there).  Skin:    General: Skin is warm and dry.     Capillary Refill: Capillary refill takes less than 2 seconds.  Neurological:     General: No focal deficit present.     Mental Status: She is alert.  Psychiatric:        Mood and Affect: Mood normal.        Behavior: Behavior normal.        Thought Content: Thought content normal.        Judgment: Judgment normal.       Assessment & Plan:  1. Pain in surgical scar Will do 10 day course of ibuprofen to see if this helps to decrease inflammation in the area.  2. Enterocele US shows complex fluid filled area about 3-4 cm in diameter. As it does not both patient, will watch area for now.

## 2021-08-08 DIAGNOSIS — F3131 Bipolar disorder, current episode depressed, mild: Secondary | ICD-10-CM | POA: Diagnosis not present

## 2021-08-22 DIAGNOSIS — F3131 Bipolar disorder, current episode depressed, mild: Secondary | ICD-10-CM | POA: Diagnosis not present

## 2021-09-04 DIAGNOSIS — F3131 Bipolar disorder, current episode depressed, mild: Secondary | ICD-10-CM | POA: Diagnosis not present

## 2021-10-04 DIAGNOSIS — F3131 Bipolar disorder, current episode depressed, mild: Secondary | ICD-10-CM | POA: Diagnosis not present

## 2021-10-11 DIAGNOSIS — F3131 Bipolar disorder, current episode depressed, mild: Secondary | ICD-10-CM | POA: Diagnosis not present

## 2021-11-10 ENCOUNTER — Ambulatory Visit (INDEPENDENT_AMBULATORY_CARE_PROVIDER_SITE_OTHER): Payer: Medicare Other

## 2021-11-10 VITALS — Ht 60.0 in | Wt 150.0 lb

## 2021-11-10 DIAGNOSIS — Z0000 Encounter for general adult medical examination without abnormal findings: Secondary | ICD-10-CM

## 2021-11-10 NOTE — Patient Instructions (Signed)
[name], ?Thank you for taking time to come for your Medicare Wellness Visit. I appreciate your ongoing commitment to your health goals. Please review the following plan we discussed and let me know if I can assist you in the future.  ? ?Screening recommendations/referrals: ?Colonoscopy: n/a ?Mammogram: n/a ?Bone Density: n/a ?Recommended yearly ophthalmology/optometry visit for glaucoma screening and checkup ?Recommended yearly dental visit for hygiene and checkup ? ?Vaccinations: ?Influenza vaccine: decline ?Pneumococcal vaccine: n/a ?Tdap vaccine: completed 04/22/2017, due 04/23/2027 ?Shingles vaccine: n/a  ?Covid-19: decline ? ?Advanced directives: Advance directive discussed with you today.  ? ?Conditions/risks identified: smoking ? ?Next appointment: Follow up in one year for your annual wellness visit.  ? ?Preventive Care 2-42 Years Old, Female ?Preventive care refers to lifestyle choices and visits with your health care provider that can promote health and wellness. Preventive care visits are also called wellness exams. ?What can I expect for my preventive care visit? ?Counseling ?During your preventive care visit, your health care provider may ask about your: ?Medical history, including: ?Past medical problems. ?Family medical history. ?Pregnancy history. ?Current health, including: ?Menstrual cycle. ?Method of birth control. ?Emotional well-being. ?Home life and relationship well-being. ?Sexual activity and sexual health. ?Lifestyle, including: ?Alcohol, nicotine or tobacco, and drug use. ?Access to firearms. ?Diet, exercise, and sleep habits. ?Work and work Statistician. ?Sunscreen use. ?Safety issues such as seatbelt and bike helmet use. ?Physical exam ?Your health care provider may check your: ?Height and weight. These may be used to calculate your BMI (body mass index). BMI is a measurement that tells if you are at a healthy weight. ?Waist circumference. This measures the distance around your  waistline. This measurement also tells if you are at a healthy weight and may help predict your risk of certain diseases, such as type 2 diabetes and high blood pressure. ?Heart rate and blood pressure. ?Body temperature. ?Skin for abnormal spots. ?What immunizations do I need? ?Vaccines are usually given at various ages, according to a schedule. Your health care provider will recommend vaccines for you based on your age, medical history, and lifestyle or other factors, such as travel or where you work. ?What tests do I need? ?Screening ?Your health care provider may recommend screening tests for certain conditions. This may include: ?Pelvic exam and Pap test. ?Lipid and cholesterol levels. ?Diabetes screening. This is done by checking your blood sugar (glucose) after you have not eaten for a while (fasting). ?Hepatitis B test. ?Hepatitis C test. ?HIV (human immunodeficiency virus) test. ?STI (sexually transmitted infection) testing, if you are at risk. ?BRCA-related cancer screening. This may be done if you have a family history of breast, ovarian, tubal, or peritoneal cancers. ?Talk with your health care provider about your test results, treatment options, and if necessary, the need for more tests. ?Follow these instructions at home: ?Eating and drinking ? ?Eat a healthy diet that includes fresh fruits and vegetables, whole grains, lean protein, and low-fat dairy products. ?Take vitamin and mineral supplements as recommended by your health care provider. ?Do not drink alcohol if: ?Your health care provider tells you not to drink. ?You are pregnant, may be pregnant, or are planning to become pregnant. ?If you drink alcohol: ?Limit how much you have to 0-1 drink a day. ?Know how much alcohol is in your drink. In the [place] one drink equals one 12 oz bottle of beer (355 mL), one 5 oz glass of wine (148 mL), or one 1? oz glass of hard liquor (44 mL). ?Lifestyle ?Brush your  teeth every morning and night with fluoride  toothpaste. Floss one time each day. ?Exercise for at least 30 minutes 5 or more days each week. ?Do not use any products that contain nicotine or tobacco. These products include cigarettes, chewing tobacco, and vaping devices, such as e-cigarettes. If you need help quitting, ask your health care provider. ?Do not use drugs. ?If you are sexually active, practice safe sex. Use a condom or other form of protection to prevent STIs. ?If you do not wish to become pregnant, use a form of birth control. If you plan to become pregnant, see your health care provider for a prepregnancy visit. ?Find healthy ways to manage stress, such as: ?Meditation, yoga, or listening to music. ?Journaling. ?Talking to a trusted person. ?Spending time with friends and family. ?Minimize exposure to UV radiation to reduce your risk of skin cancer. ?Safety ?Always wear your seat belt while driving or riding in a vehicle. ?Do not drive: ?If you have been drinking alcohol. Do not ride with someone who has been drinking. ?If you have been using any mind-altering substances or drugs. ?While texting. ?When you are tired or distracted. ?Wear a helmet and other protective equipment during sports activities. ?If you have firearms in your house, make sure you follow all gun safety procedures. ?Seek help if you have been physically or sexually abused. ?What's next? ?Go to your health care provider once a year for an annual wellness visit. ?Ask your health care provider how often you should have your eyes and teeth checked. ?Stay up to date on all vaccines. ?This information is not intended to replace advice given to you by your health care provider. Make sure you discuss any questions you have with your health care provider. ?Document Revised: 01/04/2021 Document Reviewed: 01/04/2021 ?Elsevier Patient Education ? [place]. ? ?

## 2021-11-10 NOTE — Progress Notes (Signed)
?I connected with Tiffany Juarez today by telephone and verified that I am speaking with the correct person using two identifiers. ?Location patient: home ?Location provider: work ?Persons participating in the virtual visit: Kaytlan Shabre, Kre[place] LPN. ?  ?I discussed the limitations, risks, security and privacy concerns of performing an evaluation and management service by telephone and the availability of in person appointments. I also discussed with the patient that there may be a patient responsible charge related to this service. The patient expressed understanding and verbally consented to this telephonic visit.  ?  ?Interactive audio and video telecommunications were attempted between this provider and patient, however failed, due to patient having technical difficulties OR patient did not have access to video capability.  We continued and completed visit with audio only. ? ?  ? ?Vital signs may be patient reported or missing. ? ?Subjective:  ? Tiffany Juarez is a 41 y.o. female who presents for an Initial Medicare Annual Wellness Visit. ? ?Review of Systems    ? ?Cardiac Risk Factors include: smoking/ tobacco exposure ? ?   ?Objective:  ?  ?Today's Vitals  ? 11/10/21 0956 11/10/21 0958  ?Weight: 150 lb (68 kg)   ?Height: 5' (1.524 m)   ?PainSc:  5   ? ?Body mass index is 29.29 kg/m?. ? ? ?  11/10/2021  ? 10:02 AM 01/19/2021  ?  7:55 AM 05/06/2017  ?  9:08 AM 04/22/2017  ?  8:49 AM 03/15/2017  ?  8:49 AM 03/01/2017  ? 11:15 AM 09/02/2016  ?  1:22 AM  ?Advanced Directives  ?Does Patient Have a Medical Advance Directive? No No No No No No No  ?Would patient like information on creating a medical advance directive?  No - Patient declined No - Patient declined No - Patient declined No - Patient declined No - Patient declined   ? ? ?Current Medications (verified) ?Outpatient Encounter Medications as of 11/10/2021  ?Medication Sig  ? busPIRone (BUSPAR) 15 MG tablet Take 15 mg by mouth 3 (three) times daily.  ? lurasidone  (LATUDA) 20 MG TABS tablet Take by mouth.  ? loratadine (CLARITIN) 10 MG tablet Take 10 mg by mouth daily. (Patient not taking: Reported on 11/10/2021)  ? ?No facility-administered encounter medications on file as of 11/10/2021.  ? ? ?Allergies (verified) ?Cephalosporins, Peanut-containing drug products, and Penicillins  ? ?History: ?Past Medical History:  ?Diagnosis Date  ? Anginal pain ([place])   ? Bipolar disorder ([place])   ? Depression   ? Fibroid   ? Fibromyalgia   ? Numbness and tingling   ? Prediabetes 11/06/2018  ? ?Past Surgical History:  ?Procedure Laterality Date  ? CESAREAN SECTION    ? CESAREAN SECTION N/A 01/19/2021  ? Procedure: CESAREAN SECTION;  Surgeon: Janyth Pupa, DO;  Location: [facility];  Service: Obstetrics;  Laterality: N/A;  ? wisdom teeth    ? ?Family History  ?Problem Relation Age of Onset  ? Depression Father   ? Schizophrenia Father   ? Depression Brother   ? Healthy Mother   ? Hypertension Maternal Grandmother   ? Cancer Maternal Grandmother   ? Diabetes Maternal Aunt   ? ?Social History  ? ?Socioeconomic History  ? Marital status: Single  ?  Spouse name: Not on file  ? Number of children: 3  ? Years of education: 63  ? Highest education level: High school graduate  ?Occupational History  ? Occupation: Unemployed  ?Tobacco Use  ? Smoking status: Some Days  ?  Packs/day: 0.50  ?  Types: Cigarettes  ? Smokeless tobacco: Never  ?Vaping Use  ? Vaping Use: Some days  ? Substances: Nicotine, CBD, Flavoring  ?Substance and Sexual Activity  ? Alcohol use: No  ?  Comment: quit 2014  ? Drug use: No  ? Sexual activity: Yes  ?  Birth control/protection: None  ?Other Topics Concern  ? Not on file  ?Social History Narrative  ? Lives at home with family.  ? Right-handed.  ? 2 cups caffeine per day.  ? ?Social Determinants of Health  ? ?Financial Resource Strain: Low Risk   ? Difficulty of Paying Living Expenses: Not hard at all  ?Food Insecurity: No Food Insecurity  ? Worried About Charity fundraiser in  the Last Year: Never true  ? Ran Out of Food in the Last Year: Never true  ?Transportation Needs: No Transportation Needs  ? Lack of Transportation (Medical): No  ? Lack of Transportation (Non-Medical): No  ?Physical Activity: Inactive  ? Days of Exercise per Week: 0 days  ? Minutes of Exercise per Session: 0 min  ?Stress: No Stress Concern Present  ? Feeling of Stress : Not at all  ?Social Connections: Not on file  ? ? ?Tobacco Counseling ?Ready to quit: No ?Counseling given: Not Answered ? ? ?Clinical Intake: ? ?Pre-visit preparation completed: Yes ? ?Pain : 0-10 ?Pain Score: 5  ?Pain Type: Chronic pain ?Pain Location: Generalized ?Pain Descriptors / Indicators: Aching, Stabbing ?Pain Onset: More than a month ago ?Pain Frequency: Constant ? ?  ? ?Nutritional Status: BMI 25 -29 Overweight ?Nutritional Risks: None ?Diabetes: No ? ?How often do you need to have someone help you when you read instructions, pamphlets, or other written materials from your doctor or pharmacy?: 1 - Never ?What is the last grade level you completed in school?: 12th grade ? ?Diabetic? no ? ?Interpreter Needed?: No ? ?Information entered by :: NAllen LPN ? ? ?Activities of Daily Living ? ?  11/10/2021  ? 10:04 AM 01/19/2021  ?  7:57 AM  ?In your present state of health, do you have any difficulty performing the following activities:  ?Hearing? 0 0  ?Vision? 0 0  ?Difficulty concentrating or making decisions? 1 0  ?Walking or climbing stairs? 0 0  ?Dressing or bathing? 0 0  ?Doing errands, shopping? 0   ?Preparing Food and eating ? N   ?Using the Toilet? N   ?In the past six months, have you accidently leaked urine? Y   ?Do you have problems with loss of bowel control? N   ?Managing your Medications? N   ?Managing your Finances? N   ?Housekeeping or managing your Housekeeping? N   ? ? ?Patient Care Team: ?Marcellina Millin as PCP - General (Physician Assistant) ? ?Indicate any recent Medical Services you may have received from other than  [facility] providers in the past year (date may be approximate). ? ?   ?Assessment:  ? This is a routine wellness examination for Tiffany Juarez. ? ?Hearing/Vision screen ?Vision Screening - Comments:: No regular eye exams ? ?Dietary issues and exercise activities discussed: ?Current Exercise Habits: The patient does not participate in regular exercise at present ? ? Goals Addressed   ? ?  ?  ?  ?  ? This Visit's Progress  ?  Patient Stated     ?  11/10/2021, no goals ?  ? ?  ? ?Depression Screen ? ?  11/10/2021  ? 10:02 AM 11/17/2020  ?  9:15 AM 11/17/2019  ?  9:37 AM 03/18/2018  ?  2:11 PM 11/13/2017  ? 11:04 AM 06/17/2017  ?  8:56 AM 05/06/2017  ?  9:08 AM  ?PHQ 2/9 Scores  ?PHQ - 2 Score 6 6 0 0 0 5 0  ?PHQ- 9 Score '16 21    19   '$ ?  ?Fall Risk ? ?  11/10/2021  ? 10:02 AM 11/17/2020  ?  9:15 AM 11/17/2019  ?  9:37 AM 03/18/2018  ?  2:11 PM  ?Fall Risk   ?Falls in the past year? 0 0 0 No  ?Number falls in past yr: 0 0 0   ?Injury with Fall? 0 0 0   ?Risk for fall due to : Medication side effect No Fall Risks    ?Follow up Falls evaluation completed;Education provided;Falls prevention discussed Falls evaluation completed    ? ? ?FALL RISK PREVENTION PERTAINING TO THE HOME: ? ?Any stairs in or around the home? Yes  ?If so, are there any without handrails? No  ?Home free of loose throw rugs in walkways, pet beds, electrical cords, etc? Yes  ?Adequate lighting in your home to reduce risk of falls? Yes  ? ?ASSISTIVE DEVICES UTILIZED TO PREVENT FALLS: ? ?Life alert? No  ?Use of a cane, walker or w/c? No  ?Grab bars in the bathroom? No  ?Shower chair or bench in shower? No  ?Elevated toilet seat or a handicapped toilet? No  ? ?TIMED UP AND GO: ? ?Was the test performed? No .  ? ? ? ? ?Cognitive Function: ?  ?  ? ?  11/10/2021  ? 10:06 AM  ?6CIT Screen  ?What Year? 0 points  ?What month? 0 points  ?What time? 0 points  ?Count back from 20 0 points  ?Months in reverse 0 points  ?Repeat phrase 0 points  ?Total Score 0 points   ? ? ?Immunizations ?Immunization History  ?Administered Date(s) Administered  ? Tdap 04/22/2017  ? ? ?TDAP status: Up to date ? ?Flu Vaccine status: Declined, Education has been provided regarding the importance of this vaccine

## 2021-11-15 DIAGNOSIS — F3131 Bipolar disorder, current episode depressed, mild: Secondary | ICD-10-CM | POA: Diagnosis not present

## 2021-11-20 ENCOUNTER — Ambulatory Visit: Payer: Medicare Other | Admitting: Family Medicine

## 2021-11-27 ENCOUNTER — Ambulatory Visit (INDEPENDENT_AMBULATORY_CARE_PROVIDER_SITE_OTHER): Payer: Medicare Other | Admitting: Physician Assistant

## 2021-11-27 ENCOUNTER — Encounter: Payer: Self-pay | Admitting: Physician Assistant

## 2021-11-27 VITALS — BP 110/70 | HR 66 | Ht 60.0 in | Wt 160.0 lb

## 2021-11-27 DIAGNOSIS — Z0000 Encounter for general adult medical examination without abnormal findings: Secondary | ICD-10-CM | POA: Diagnosis not present

## 2021-11-27 DIAGNOSIS — Z6831 Body mass index (BMI) 31.0-31.9, adult: Secondary | ICD-10-CM

## 2021-11-27 DIAGNOSIS — R7303 Prediabetes: Secondary | ICD-10-CM

## 2021-11-27 DIAGNOSIS — E669 Obesity, unspecified: Secondary | ICD-10-CM

## 2021-11-27 DIAGNOSIS — R634 Abnormal weight loss: Secondary | ICD-10-CM

## 2021-11-27 DIAGNOSIS — E559 Vitamin D deficiency, unspecified: Secondary | ICD-10-CM | POA: Diagnosis not present

## 2021-11-27 DIAGNOSIS — E782 Mixed hyperlipidemia: Secondary | ICD-10-CM | POA: Diagnosis not present

## 2021-11-27 HISTORY — DX: Obesity, unspecified: E66.9

## 2021-11-27 HISTORY — DX: Body mass index (BMI) 31.0-31.9, adult: Z68.31

## 2021-11-27 NOTE — Assessment & Plan Note (Signed)
Will check level today, has a history of multiple joint pains, will prescribe supplements if needed ?

## 2021-11-27 NOTE — Assessment & Plan Note (Signed)
controlled, eat a low sugar diet, avoid starchy food with a lot of carbohydrates, avoid fried and processed foods; last hgb a1c 6.0 06/2020 ?

## 2021-11-27 NOTE — Assessment & Plan Note (Signed)
controlled, eat a low fat diet, increase fiber intake (Benefiber or Metamucil, Cherrios,  oatmeal, beans, nuts, fruits and vegetables), limit saturated fats (in fried foods, red meat), can add OTC fish oil supplement, eat fish with Omega-3 fatty acids like salmon and tuna, exercise for 30 minutes 3 - 5 times a week, drink 8 - 10 glasses of water a day ? ? ?

## 2021-11-27 NOTE — Assessment & Plan Note (Signed)
Stable, drink 8 - 10 glasses of water daily, limit sugar intake and eat a low fat diet, exercise 3 - 5 days a week, example walking 1 - 2 miles  ? ?

## 2021-11-27 NOTE — Patient Instructions (Signed)

## 2021-11-27 NOTE — Progress Notes (Signed)
? ?Complete physical exam ? ? ?Patient: Tiffany Juarez   DOB: May 26, 1981   41 y.o. Female  MRN: [id number] ?Visit Date: 11/27/2021 ? ?Chief Complaint  ?Patient presents with  ? Annual Exam  ?  Fasting CPE- Weight loss concerns  ? ?Subjective  ?  ?Tiffany Juarez is a 41 y.o. female who presents today for a complete physical exam.  ? ?Reports is generally feeling well, fairly well, poorly; is eating once a day, a regular meal, healthy diet; is not sleeping well 2 hours at a time, broken sleep; has 4 children, baby sometimes wakes up 1 time a night; drinks some water every day; is not exercising ? ?Reports that she used to weigh about 100 pounds and since being pregnant last year her weight was up now states she has been losing weight since was pregnant last year ? ?Psychiatrist every 4 weeks to 12 weeks [facility] ?Therapist every 4 weeks, last visit last week or so ? ?HPI ?HPI   ? ? Annual Exam   ? Additional comments: Fasting CPE- Weight loss concerns ? ?  ?  ?Last edited by Irene Pap, PA-C on 11/27/2021 11:10 AM.  ?  ?  ? ? ?Past Medical History:  ?Diagnosis Date  ? Anginal pain ([place])   ? Bipolar disorder ([place])   ? Depression   ? Fibroid   ? Fibromyalgia   ? Numbness and tingling   ? Obesity (BMI 30-39.9) 11/17/2019  ? Prediabetes 11/06/2018  ? Pure hypercholesterolemia 11/17/2019  ? ?Past Surgical History:  ?Procedure Laterality Date  ? CESAREAN SECTION    ? CESAREAN SECTION N/A 01/19/2021  ? Procedure: CESAREAN SECTION;  Surgeon: Janyth Pupa, DO;  Location: [facility];  Service: Obstetrics;  Laterality: N/A;  ? wisdom teeth    ? ?Social History  ? ?Socioeconomic History  ? Marital status: Single  ?  Spouse name: Not on file  ? Number of children: 3  ? Years of education: 66  ? Highest education level: High school graduate  ?Occupational History  ? Occupation: Unemployed  ?Tobacco Use  ? Smoking status: Some Days  ?  Packs/day: 0.50  ?  Types: Cigarettes  ? Smokeless tobacco: Never  ?Vaping Use  ? Vaping Use:  Some days  ? Substances: Nicotine, CBD, Flavoring  ?Substance and Sexual Activity  ? Alcohol use: No  ?  Comment: quit 2014  ? Drug use: No  ? Sexual activity: Yes  ?  Birth control/protection: None  ?Other Topics Concern  ? Not on file  ?Social History Narrative  ? Lives at home with family.  ? Right-handed.  ? 2 cups caffeine per day.  ? ?Social Determinants of Health  ? ?Financial Resource Strain: Low Risk   ? Difficulty of Paying Living Expenses: Not hard at all  ?Food Insecurity: No Food Insecurity  ? Worried About Charity fundraiser in the Last Year: Never true  ? Ran Out of Food in the Last Year: Never true  ?Transportation Needs: No Transportation Needs  ? Lack of Transportation (Medical): No  ? Lack of Transportation (Non-Medical): No  ?Physical Activity: Inactive  ? Days of Exercise per Week: 0 days  ? Minutes of Exercise per Session: 0 min  ?Stress: No Stress Concern Present  ? Feeling of Stress : Not at all  ?Social Connections: Not on file  ?Intimate Partner Violence: Not on file  ? ?Family Status  ?Relation Name Status  ? Father  Alive  ? Brother  Deceased  ? Mother  Alive  ? MGM  Deceased  ? Mat Aunt  (Not Specified)  ? Sister  Alive  ? Sister  Alive  ? ?Family History  ?Problem Relation Age of Onset  ? Depression Father   ? Schizophrenia Father   ? Depression Brother   ? Healthy Mother   ? Hypertension Maternal Grandmother   ? Cancer Maternal Grandmother   ? Diabetes Maternal Aunt   ? ?Allergies  ?Allergen Reactions  ? Cephalosporins Anaphylaxis  ? Peanut-Containing Drug Products Anaphylaxis  ? Penicillins Anaphylaxis  ?  ?Patient Care Team: ?Marcellina Millin as PCP - General (Physician Assistant)  ? ?Medications: ?Outpatient Medications Prior to Visit  ?Medication Sig  ? busPIRone (BUSPAR) 15 MG tablet Take 15 mg by mouth 3 (three) times daily.  ? lurasidone (LATUDA) 20 MG TABS tablet Take by mouth.  ? [DISCONTINUED] loratadine (CLARITIN) 10 MG tablet Take 10 mg by mouth daily. (Patient not  taking: Reported on 11/10/2021)  ? ?No facility-administered medications prior to visit.  ? ? ?Review of Systems  ?Constitutional:  Positive for unexpected weight change. Negative for activity change and fever.  ?HENT:  Negative for congestion, ear pain and voice change.   ?Eyes:  Negative for redness.  ?Respiratory:  Negative for cough.   ?Cardiovascular:  Negative for chest pain.  ?Gastrointestinal:  Negative for constipation and diarrhea.  ?Endocrine: Negative for polyuria.  ?Genitourinary:  Negative for flank pain.  ?Musculoskeletal:  Negative for gait problem and neck stiffness.  ?Skin:  Negative for color change and rash.  ?Neurological:  Negative for dizziness.  ?Hematological:  Negative for adenopathy.  ?Psychiatric/Behavioral:  Positive for sleep disturbance. Negative for agitation, behavioral problems, confusion and suicidal ideas. The patient is nervous/anxious.   ? ?Last CBC ?Lab Results  ?Component Value Date  ? WBC 24.8 (H) 01/20/2021  ? HGB 8.8 (L) 01/20/2021  ? HCT 27.1 (L) 01/20/2021  ? MCV 83.1 01/20/2021  ? MCH 27.0 01/20/2021  ? RDW 16.2 (H) 01/20/2021  ? PLT 174 01/20/2021  ? ?Last metabolic panel ?Lab Results  ?Component Value Date  ? GLUCOSE 95 11/17/2019  ? NA 137 11/17/2019  ? K 5.0 11/17/2019  ? CL 101 11/17/2019  ? CO2 23 11/17/2019  ? BUN 14 11/17/2019  ? CREATININE 0.92 11/17/2019  ? GFRNONAA 80 11/17/2019  ? CALCIUM 9.5 11/17/2019  ? PROT 6.8 11/17/2019  ? ALBUMIN 4.4 11/17/2019  ? LABGLOB 2.4 11/17/2019  ? AGRATIO 1.8 11/17/2019  ? BILITOT <0.2 11/17/2019  ? ALKPHOS 110 11/17/2019  ? AST 15 11/17/2019  ? ALT 14 11/17/2019  ? ?Last lipids ?Lab Results  ?Component Value Date  ? CHOL 251 (H) 11/17/2019  ? HDL 88 11/17/2019  ? LDLCALC 153 (H) 11/17/2019  ? TRIG 62 11/17/2019  ? CHOLHDL 2.9 11/17/2019  ? ?Last hemoglobin A1c ?Lab Results  ?Component Value Date  ? HGBA1C 6.0 (H) 06/28/2020  ? ?  ? ?The ASCVD Risk score (Arnett DK, et al., 2019) failed to calculate for the following reasons: ?   The 2019 ASCVD risk score is only valid for ages 38 to 16 ? ? Objective  ?  ?BP 110/70   Pulse 66   Ht 5' (1.524 m)   Wt 160 lb (72.6 kg)   LMP 10/25/2021   SpO2 98%   Breastfeeding No   BMI 31.25 kg/m?  ? ?  ? ? ?Physical Exam ?Vitals and nursing note reviewed.  ?Constitutional:   ?  General: She is not in acute distress. ?   Appearance: Normal appearance. She is not ill-appearing.  ?HENT:  ?   Head: Normocephalic and atraumatic.  ?   Right Ear: Tympanic membrane, ear canal and external ear normal.  ?   Left Ear: Tympanic membrane, ear canal and external ear normal.  ?   Nose: No congestion.  ?Eyes:  ?   Extraocular Movements: Extraocular movements intact.  ?   Conjunctiva/sclera: Conjunctivae normal.  ?   Pupils: Pupils are equal, round, and reactive to light.  ?Neck:  ?   Vascular: No carotid bruit.  ?Cardiovascular:  ?   Rate and Rhythm: Normal rate and regular rhythm.  ?   Pulses: Normal pulses.  ?   Heart sounds: Normal heart sounds.  ?Pulmonary:  ?   Effort: Pulmonary effort is normal.  ?   Breath sounds: Normal breath sounds. No wheezing.  ?Abdominal:  ?   General: Bowel sounds are normal.  ?   Palpations: Abdomen is soft.  ?Musculoskeletal:     ?   General: Normal range of motion.  ?   Cervical back: Normal range of motion and neck supple.  ?   Right lower leg: No edema.  ?   Left lower leg: No edema.  ?Skin: ?   General: Skin is warm and dry.  ?   Findings: No bruising.  ?Neurological:  ?   General: No focal deficit present.  ?   Mental Status: She is alert and oriented to person, place, and time.  ?Psychiatric:     ?   Mood and Affect: Mood normal.     ?   Behavior: Behavior normal.     ?   Thought Content: Thought content normal.  ?  ? ? ?Last depression screening scores ? ?  11/27/2021  ? 10:02 AM 11/10/2021  ? 10:02 AM 11/17/2020  ?  9:15 AM  ?PHQ 2/9 Scores  ?PHQ - 2 Score '6 6 6  '$ ?PHQ- 9 Score '18 16 21  '$ ? ?Last fall risk screening ? ?  11/27/2021  ? 10:01 AM  ?Fall Risk   ?Falls in the past year? 0   ?Number falls in past yr: 0  ?Injury with Fall? 0  ?Risk for fall due to : No Fall Risks  ?Follow up Falls evaluation completed  ? ? ? ?No results found for any visits on 11/27/21. ? Assessment & Plan  ?  ?

## 2021-11-28 LAB — THYROID PANEL WITH TSH
Free Thyroxine Index: 1.8 (ref 1.2–4.9)
T3 Uptake Ratio: 24 % (ref 24–39)
T4, Total: 7.7 ug/dL (ref 4.5–12.0)
TSH: 0.834 u[IU]/mL (ref 0.450–4.500)

## 2021-11-28 LAB — CBC WITH DIFFERENTIAL/PLATELET
Basophils Absolute: 0 10*3/uL (ref 0.0–0.2)
Basos: 1 %
EOS (ABSOLUTE): 0.2 10*3/uL (ref 0.0–0.4)
Eos: 3 %
Hematocrit: 41.8 % (ref 34.0–46.6)
Hemoglobin: 13.7 g/dL (ref 11.1–15.9)
Immature Grans (Abs): 0 10*3/uL (ref 0.0–0.1)
Immature Granulocytes: 0 %
Lymphocytes Absolute: 2.5 10*3/uL (ref 0.7–3.1)
Lymphs: 43 %
MCH: 28.1 pg (ref 26.6–33.0)
MCHC: 32.8 g/dL (ref 31.5–35.7)
MCV: 86 fL (ref 79–97)
Monocytes Absolute: 0.4 10*3/uL (ref 0.1–0.9)
Monocytes: 6 %
Neutrophils Absolute: 2.7 10*3/uL (ref 1.4–7.0)
Neutrophils: 47 %
Platelets: 257 10*3/uL (ref 150–450)
RBC: 4.87 x10E6/uL (ref 3.77–5.28)
RDW: 14.9 % (ref 11.7–15.4)
WBC: 5.7 10*3/uL (ref 3.4–10.8)

## 2021-11-28 LAB — COMPREHENSIVE METABOLIC PANEL
ALT: 11 IU/L (ref 0–32)
AST: 10 IU/L (ref 0–40)
Albumin/Globulin Ratio: 2 (ref 1.2–2.2)
Albumin: 4.5 g/dL (ref 3.8–4.8)
Alkaline Phosphatase: 76 IU/L (ref 44–121)
BUN/Creatinine Ratio: 10 (ref 9–23)
BUN: 10 mg/dL (ref 6–20)
Bilirubin Total: <0.2 mg/dL (ref 0.0–1.2)
CO2: 23 mmol/L (ref 20–29)
Calcium: 9.7 mg/dL (ref 8.7–10.2)
Chloride: 105 mmol/L (ref 96–106)
Creatinine, Ser: 0.98 mg/dL (ref 0.57–1.00)
Globulin, Total: 2.2 g/dL (ref 1.5–4.5)
Glucose: 89 mg/dL (ref 70–99)
Potassium: 4.9 mmol/L (ref 3.5–5.2)
Sodium: 141 mmol/L (ref 134–144)
Total Protein: 6.7 g/dL (ref 6.0–8.5)
eGFR: 75 mL/min/{1.73_m2} (ref >59)

## 2021-11-28 LAB — HEMOGLOBIN A1C
Est. average glucose Bld gHb Est-mCnc: 114 mg/dL
Hgb A1c MFr Bld: 5.6 % (ref 4.8–5.6)

## 2021-11-28 LAB — LIPID PANEL
Chol/HDL Ratio: 4 ratio (ref 0.0–4.4)
Cholesterol, Total: 211 mg/dL — ABNORMAL HIGH (ref 100–199)
HDL: 53 mg/dL (ref >39)
LDL Chol Calc (NIH): 144 mg/dL — ABNORMAL HIGH (ref 0–99)
Triglycerides: 79 mg/dL (ref 0–149)
VLDL Cholesterol Cal: 14 mg/dL (ref 5–40)

## 2021-11-29 LAB — SPECIMEN STATUS REPORT

## 2021-11-29 LAB — VITAMIN D 25 HYDROXY (VIT D DEFICIENCY, FRACTURES): Vit D, 25-Hydroxy: 22.3 ng/mL — ABNORMAL LOW (ref 30.0–100.0)

## 2021-12-01 ENCOUNTER — Other Ambulatory Visit: Payer: Self-pay | Admitting: Physician Assistant

## 2021-12-01 DIAGNOSIS — E559 Vitamin D deficiency, unspecified: Secondary | ICD-10-CM

## 2021-12-01 MED ORDER — VITAMIN D (ERGOCALCIFEROL) 1.25 MG (50000 UNIT) PO CAPS: 50000.0000 [IU] | ORAL_CAPSULE | ORAL | 3 refills | Status: DC

## 2021-12-13 DIAGNOSIS — F3131 Bipolar disorder, current episode depressed, mild: Secondary | ICD-10-CM | POA: Diagnosis not present

## 2021-12-20 DIAGNOSIS — F3131 Bipolar disorder, current episode depressed, mild: Secondary | ICD-10-CM | POA: Diagnosis not present

## 2022-01-16 DIAGNOSIS — F3131 Bipolar disorder, current episode depressed, mild: Secondary | ICD-10-CM | POA: Diagnosis not present

## 2022-02-19 DIAGNOSIS — F3131 Bipolar disorder, current episode depressed, mild: Secondary | ICD-10-CM | POA: Diagnosis not present

## 2022-02-22 ENCOUNTER — Encounter: Payer: Self-pay | Admitting: General Practice

## 2022-03-19 DIAGNOSIS — F3131 Bipolar disorder, current episode depressed, mild: Secondary | ICD-10-CM | POA: Diagnosis not present

## 2022-03-20 DIAGNOSIS — F3131 Bipolar disorder, current episode depressed, mild: Secondary | ICD-10-CM | POA: Diagnosis not present

## 2022-03-28 ENCOUNTER — Encounter: Payer: Self-pay | Admitting: Internal Medicine

## 2022-04-16 DIAGNOSIS — F3131 Bipolar disorder, current episode depressed, mild: Secondary | ICD-10-CM | POA: Diagnosis not present

## 2022-05-01 ENCOUNTER — Encounter: Payer: Self-pay | Admitting: Internal Medicine

## 2022-05-21 DIAGNOSIS — F3131 Bipolar disorder, current episode depressed, mild: Secondary | ICD-10-CM | POA: Diagnosis not present

## 2022-06-05 ENCOUNTER — Encounter: Payer: Self-pay | Admitting: Internal Medicine

## 2022-06-18 DIAGNOSIS — F3131 Bipolar disorder, current episode depressed, mild: Secondary | ICD-10-CM | POA: Diagnosis not present

## 2022-08-16 DIAGNOSIS — F3131 Bipolar disorder, current episode depressed, mild: Secondary | ICD-10-CM | POA: Diagnosis not present

## 2022-09-05 DIAGNOSIS — F3131 Bipolar disorder, current episode depressed, mild: Secondary | ICD-10-CM | POA: Diagnosis not present

## 2022-09-14 DIAGNOSIS — F3131 Bipolar disorder, current episode depressed, mild: Secondary | ICD-10-CM | POA: Diagnosis not present

## 2022-09-27 DIAGNOSIS — S0501XA Injury of conjunctiva and corneal abrasion without foreign body, right eye, initial encounter: Secondary | ICD-10-CM | POA: Diagnosis not present

## 2022-09-27 DIAGNOSIS — J0100 Acute maxillary sinusitis, unspecified: Secondary | ICD-10-CM | POA: Diagnosis not present

## 2022-10-10 DIAGNOSIS — K080 Exfoliation of teeth due to systemic causes: Secondary | ICD-10-CM | POA: Diagnosis not present

## 2022-10-29 DIAGNOSIS — K080 Exfoliation of teeth due to systemic causes: Secondary | ICD-10-CM | POA: Diagnosis not present

## 2022-11-05 ENCOUNTER — Encounter: Payer: Self-pay | Admitting: *Deleted

## 2022-11-07 ENCOUNTER — Telehealth: Payer: Self-pay | Admitting: Physician Assistant

## 2022-11-07 NOTE — Telephone Encounter (Signed)
Contacted [place] to schedule their annual wellness visit. Appointment made for 4/30.  Rudell Cobb AWV direct phone # [phone]     Due to a schedule change, we have had to reschedule your medicare wellness visit from  11/16/22  to 11/20/22  Patient aware

## 2022-11-16 ENCOUNTER — Ambulatory Visit: Payer: Medicare Other

## 2022-11-20 ENCOUNTER — Ambulatory Visit (INDEPENDENT_AMBULATORY_CARE_PROVIDER_SITE_OTHER): Payer: Medicare Other

## 2022-11-20 VITALS — Ht 60.0 in | Wt 150.0 lb

## 2022-11-20 DIAGNOSIS — Z0000 Encounter for general adult medical examination without abnormal findings: Secondary | ICD-10-CM | POA: Diagnosis not present

## 2022-11-20 NOTE — Progress Notes (Signed)
I connected with  Tiffany Juarez on 11/20/22 by a audio enabled telemedicine application and verified that I am speaking with the correct person using two identifiers.  Patient Location: Home  Provider Location: Office/Clinic  I discussed the limitations of evaluation and management by telemedicine. The patient expressed understanding and agreed to proceed.  Subjective:   Tiffany Juarez is a 42 y.o. female who presents for Medicare Annual (Subsequent) preventive examination.  Review of Systems     Cardiac Risk Factors include: smoking/ tobacco exposure     Objective:    Today's Vitals   11/20/22 1026 11/20/22 1027  Weight: 150 lb (68 kg)   Height: 5' (1.524 m)   PainSc:  5    Body mass index is 29.29 kg/m.     11/20/2022   10:30 AM 11/10/2021   10:02 AM 01/19/2021    7:55 AM 05/06/2017    9:08 AM 04/22/2017    8:49 AM 03/15/2017    8:49 AM 03/01/2017   11:15 AM  Advanced Directives  Does Patient Have a Medical Advance Directive? No No No No No No No  Would patient like information on creating a medical advance directive?   No - Patient declined No - Patient declined No - Patient declined No - Patient declined No - Patient declined    Current Medications (verified) Outpatient Encounter Medications as of 11/20/2022  Medication Sig   busPIRone (BUSPAR) 15 MG tablet Take 15 mg by mouth 3 (three) times daily.   lurasidone (LATUDA) 20 MG TABS tablet Take by mouth.   Vitamin D, Ergocalciferol, (DRISDOL) 1.25 MG (50000 UNIT) CAPS capsule Take 1 capsule (50,000 Units total) by mouth every 7 (seven) days.   No facility-administered encounter medications on file as of 11/20/2022.    Allergies (verified) Cephalosporins, Peanut-containing drug products, and Penicillins   History: Past Medical History:  Diagnosis Date   Anginal pain (HCC)    Bipolar disorder (HCC)    Depression    Fibroid    Fibromyalgia    Numbness and tingling    Obesity (BMI 30-39.9) 11/17/2019   Prediabetes  11/06/2018   Pure hypercholesterolemia 11/17/2019   Past Surgical History:  Procedure Laterality Date   CESAREAN SECTION     CESAREAN SECTION N/A 01/19/2021   Procedure: CESAREAN SECTION;  Surgeon: Myna Hidalgo, DO;  Location: [facility];  Service: Obstetrics;  Laterality: N/A;   wisdom teeth     Family History  Problem Relation Age of Onset   Depression Father    Schizophrenia Father    Depression Brother    Healthy Mother    Hypertension Maternal Grandmother    Cancer Maternal Grandmother    Diabetes Maternal Aunt    Social History   Socioeconomic History   Marital status: Single    Spouse name: Not on file   Number of children: 3   Years of education: 12   Highest education level: High school graduate  Occupational History   Occupation: Unemployed  Tobacco Use   Smoking status: Some Days    Packs/day: .5    Types: Cigarettes   Smokeless tobacco: Never  Vaping Use   Vaping Use: Some days   Substances: Nicotine, CBD, Flavoring  Substance and Sexual Activity   Alcohol use: No    Comment: quit 2014   Drug use: No   Sexual activity: Yes    Birth control/protection: None  Other Topics Concern   Not on file  Social History Narrative   Lives at home  with family.   Right-handed.   2 cups caffeine per day.   Social Determinants of Health   Financial Resource Strain: Low Risk  (11/20/2022)   Overall Financial Resource Strain (CARDIA)    Difficulty of Paying Living Expenses: Not hard at all  Food Insecurity: No Food Insecurity (11/20/2022)   Hunger Vital Sign    Worried About Running Out of Food in the Last Year: Never true    Ran Out of Food in the Last Year: Never true  Transportation Needs: No Transportation Needs (11/20/2022)   PRAPARE - Administrator, Civil Service (Medical): No    Lack of Transportation (Non-Medical): No  Physical Activity: Inactive (11/20/2022)   Exercise Vital Sign    Days of Exercise per Week: 0 days    Minutes of Exercise per  Session: 0 min  Stress: Stress Concern Present (11/20/2022)   [employer] of Occupational Health - Occupational Stress Questionnaire    Feeling of Stress : To some extent  Social Connections: Not on file    Tobacco Counseling Ready to quit: Not Answered Counseling given: Not Answered   Clinical Intake:  Pre-visit preparation completed: Yes  Pain : 0-10 Pain Score: 5  Pain Type: Chronic pain Pain Location: Generalized Pain Descriptors / Indicators: Aching Pain Onset: More than a month ago Pain Frequency: Constant     Nutritional Status: BMI 25 -29 Overweight Nutritional Risks: None Diabetes: No  How often do you need to have someone help you when you read instructions, pamphlets, or other written materials from your doctor or pharmacy?: 1 - Never  Diabetic? no  Interpreter Needed?: No  Information entered by :: NAllen LPN   Activities of Daily Living    11/20/2022   10:32 AM  In your present state of health, do you have any difficulty performing the following activities:  Hearing? 0  Vision? 0  Difficulty concentrating or making decisions? 0  Walking or climbing stairs? 0  Dressing or bathing? 0  Doing errands, shopping? 0  Preparing Food and eating ? N  Using the Toilet? N  In the past six months, have you accidently leaked urine? Y  Do you have problems with loss of bowel control? N  Managing your Medications? N  Managing your Finances? N  Housekeeping or managing your Housekeeping? N    Patient Care Team: Lexine Baton (Inactive) as PCP - General (Physician Assistant)  Indicate any recent Medical Services you may have received from other than [facility] providers in the past year (date may be approximate).     Assessment:   This is a routine wellness examination for Tiffany Juarez.  Hearing/Vision screen Vision Screening - Comments:: No regular eye exam  Dietary issues and exercise activities discussed: Current Exercise Habits: The patient does  not participate in regular exercise at present   Goals Addressed             This Visit's Progress    Patient Stated       11/20/2022, no goals       Depression Screen    11/20/2022   10:31 AM 11/27/2021   10:02 AM 11/10/2021   10:02 AM 11/17/2020    9:15 AM 11/17/2019    9:37 AM 03/18/2018    2:11 PM 11/13/2017   11:04 AM  PHQ 2/9 Scores  PHQ - 2 Score 4 6 6 6  0 0 0  PHQ- 9 Score 11 18 16 21        Fall Risk  11/20/2022   10:30 AM 11/27/2021   10:01 AM 11/10/2021   10:02 AM 11/17/2020    9:15 AM 11/17/2019    9:37 AM  Fall Risk   Falls in the past year? 0 0 0 0 0  Number falls in past yr: 0 0 0 0 0  Injury with Fall? 0 0 0 0 0  Risk for fall due to : Medication side effect No Fall Risks Medication side effect No Fall Risks   Follow up Falls prevention discussed;Education provided;Falls evaluation completed Falls evaluation completed Falls evaluation completed;Education provided;Falls prevention discussed Falls evaluation completed     FALL RISK PREVENTION PERTAINING TO THE HOME:  Any stairs in or around the home? Yes  If so, are there any without handrails? No  Home free of loose throw rugs in walkways, pet beds, electrical cords, etc? Yes  Adequate lighting in your home to reduce risk of falls? Yes   ASSISTIVE DEVICES UTILIZED TO PREVENT FALLS:  Life alert? No  Use of a cane, walker or w/c? No  Grab bars in the bathroom? No  Shower chair or bench in shower? No  Elevated toilet seat or a handicapped toilet? No   TIMED UP AND GO:  Was the test performed? No .      Cognitive Function:        11/20/2022   10:32 AM 11/10/2021   10:06 AM  6CIT Screen  What Year? 0 points 0 points  What month? 0 points 0 points  What time? 0 points 0 points  Count back from 20 0 points 0 points  Months in reverse 0 points 0 points  Repeat phrase 2 points 0 points  Total Score 2 points 0 points    Immunizations Immunization History  Administered Date(s) Administered    Tdap 04/22/2017    TDAP status: Up to date  Flu Vaccine status: Declined, Education has been provided regarding the importance of this vaccine but patient still declined. Advised may receive this vaccine at local pharmacy or Health Dept. Aware to provide a copy of the vaccination record if obtained from local pharmacy or Health Dept. Verbalized acceptance and understanding.  Pneumococcal vaccine status: Up to date  Covid-19 vaccine status: Declined, Education has been provided regarding the importance of this vaccine but patient still declined. Advised may receive this vaccine at local pharmacy or Health Dept.or vaccine clinic. Aware to provide a copy of the vaccination record if obtained from local pharmacy or Health Dept. Verbalized acceptance and understanding.  Qualifies for Shingles Vaccine? No   Zostavax completed  n/a   Shingrix Completed?: n/a  Screening Tests Health Maintenance  Topic Date Due   Medicare Annual Wellness (AWV)  11/11/2022   COVID-19 Vaccine (1) 12/03/2022 (Originally 05/31/1981)   INFLUENZA VACCINE  02/21/2023   PAP SMEAR-Modifier  06/29/2023   DTaP/Tdap/Td (2 - Td or Tdap) 04/23/2027   Hepatitis C Screening  Completed   HIV Screening  Completed   HPV VACCINES  Aged Out    Health Maintenance  Health Maintenance Due  Topic Date Due   Medicare Annual Wellness (AWV)  11/11/2022    Colorectal cancer screening: No longer required.   Mammogram status: No longer required due to age.  Bone Density status: n/a  Lung Cancer Screening: (Low Dose CT Chest recommended if Age 28-80 years, 30 pack-year currently smoking OR have quit w/in 15years.) does not qualify.   Lung Cancer Screening Referral: no  Additional Screening:  Hepatitis C Screening: does qualify; Completed  06/28/2020  Vision Screening: Recommended annual ophthalmology exams for early detection of glaucoma and other disorders of the eye. Is the patient up to date with their annual eye exam?  No   Who is the provider or what is the name of the office in which the patient attends annual eye exams? none If pt is not established with a provider, would they like to be referred to a provider to establish care? No .   Dental Screening: Recommended annual dental exams for proper oral hygiene  Community Resource Referral / Chronic Care Management: CRR required this visit?  No   CCM required this visit?  No      Plan:     I have personally reviewed and noted the following in the patient's chart:   Medical and social history Use of alcohol, tobacco or illicit drugs  Current medications and supplements including opioid prescriptions. Patient is not currently taking opioid prescriptions. Functional ability and status Nutritional status Physical activity Advanced directives List of other physicians Hospitalizations, surgeries, and ER visits in previous 12 months Vitals Screenings to include cognitive, depression, and falls Referrals and appointments  In addition, I have reviewed and discussed with patient certain preventive protocols, quality metrics, and best practice recommendations. A written personalized care plan for preventive services as well as general preventive health recommendations were provided to patient.     Barb Merino, LPN   1/61/0960   Nurse Notes: none  Due to this being a virtual visit, the after visit summary with patients personalized plan was offered to patient via mail or my-chart.  Patient would like to access on my-chart

## 2022-11-20 NOTE — Patient Instructions (Signed)
Tiffany Juarez , Thank you for taking time to come for your Medicare Wellness Visit. I appreciate your ongoing commitment to your health goals. Please review the following plan we discussed and let me know if I can assist you in the future.   These are the goals we discussed:  Goals      Patient Stated     11/10/2021, no goals     Patient Stated     11/20/2022, no goals        This is a list of the screening recommended for you and due dates:  Health Maintenance  Topic Date Due   COVID-19 Vaccine (1) 12/03/2022*   Flu Shot  02/21/2023   Pap Smear  06/29/2023   Medicare Annual Wellness Visit  11/20/2023   DTaP/Tdap/Td vaccine (2 - Td or Tdap) 04/23/2027   Hepatitis C Screening: USPSTF Recommendation to screen - Ages 53-79 yo.  Completed   HIV Screening  Completed   HPV Vaccine  Aged Out  *Topic was postponed. The date shown is not the original due date.    Advanced directives: Advance directive discussed with you today.   Conditions/risks identified: smoking  Next appointment: Follow up in one year for your annual wellness visit.   Preventive Care 40-64 Years, Female Preventive care refers to lifestyle choices and visits with your health care provider that can promote health and wellness. What does preventive care include? A yearly physical exam. This is also called an annual well check. Dental exams once or twice a year. Routine eye exams. Ask your health care provider how often you should have your eyes checked. Personal lifestyle choices, including: Daily care of your teeth and gums. Regular physical activity. Eating a healthy diet. Avoiding tobacco and drug use. Limiting alcohol use. Practicing safe sex. Taking low-dose aspirin daily starting at age 40. Taking vitamin and mineral supplements as recommended by your health care provider. What happens during an annual well check? The services and screenings done by your health care provider during your annual well check  will depend on your age, overall health, lifestyle risk factors, and family history of disease. Counseling  Your health care provider may ask you questions about your: Alcohol use. Tobacco use. Drug use. Emotional well-being. Home and relationship well-being. Sexual activity. Eating habits. Work and work Astronomer. Method of birth control. Menstrual cycle. Pregnancy history. Screening  You may have the following tests or measurements: Height, weight, and BMI. Blood pressure. Lipid and cholesterol levels. These may be checked every 5 years, or more frequently if you are over 36 years old. Skin check. Lung cancer screening. You may have this screening every year starting at age 30 if you have a 30-pack-year history of smoking and currently smoke or have quit within the past 15 years. Fecal occult blood test (FOBT) of the stool. You may have this test every year starting at age 92. Flexible sigmoidoscopy or colonoscopy. You may have a sigmoidoscopy every 5 years or a colonoscopy every 10 years starting at age 67. Hepatitis C blood test. Hepatitis B blood test. Sexually transmitted disease (STD) testing. Diabetes screening. This is done by checking your blood sugar (glucose) after you have not eaten for a while (fasting). You may have this done every 1-3 years. Mammogram. This may be done every 1-2 years. Talk to your health care provider about when you should start having regular mammograms. This may depend on whether you have a family history of breast cancer. BRCA-related cancer screening. This may  be done if you have a family history of breast, ovarian, tubal, or peritoneal cancers. Pelvic exam and Pap test. This may be done every 3 years starting at age 41. Starting at age 41, this may be done every 5 years if you have a Pap test in combination with an HPV test. Bone density scan. This is done to screen for osteoporosis. You may have this scan if you are at high risk for  osteoporosis. Discuss your test results, treatment options, and if necessary, the need for more tests with your health care provider. Vaccines  Your health care provider may recommend certain vaccines, such as: Influenza vaccine. This is recommended every year. Tetanus, diphtheria, and acellular pertussis (Tdap, Td) vaccine. You may need a Td booster every 10 years. Zoster vaccine. You may need this after age 560. Pneumococcal 13-valent conjugate (PCV13) vaccine. You may need this if you have certain conditions and were not previously vaccinated. Pneumococcal polysaccharide (PPSV23) vaccine. You may need one or two doses if you smoke cigarettes or if you have certain conditions. Talk to your health care provider about which screenings and vaccines you need and how often you need them. This information is not intended to replace advice given to you by your health care provider. Make sure you discuss any questions you have with your health care provider. Document Released: 08/05/2015 Document Revised: 03/28/2016 Document Reviewed: 05/10/2015 Elsevier Interactive Patient Education  2017 [employer]    Fall Prevention in the Home Falls can cause injuries. They can happen to people of all ages. There are many things you can do to make your home safe and to help prevent falls. What can I do on the outside of my home? Regularly fix the edges of walkways and driveways and fix any cracks. Remove anything that might make you trip as you walk through a door, such as a raised step or threshold. Trim any bushes or trees on the path to your home. Use bright outdoor lighting. Clear any walking paths of anything that might make someone trip, such as rocks or tools. Regularly check to see if handrails are loose or broken. Make sure that both sides of any steps have handrails. Any raised decks and porches should have guardrails on the edges. Have any leaves, snow, or ice cleared regularly. Use sand or  salt on walking paths during winter. Clean up any spills in your garage right away. This includes oil or grease spills. What can I do in the bathroom? Use night lights. Install grab bars by the toilet and in the tub and shower. Do not use towel bars as grab bars. Use non-skid mats or decals in the tub or shower. If you need to sit down in the shower, use a plastic, non-slip stool. Keep the floor dry. Clean up any water that spills on the floor as soon as it happens. Remove soap buildup in the tub or shower regularly. Attach bath mats securely with double-sided non-slip rug tape. Do not have throw rugs and other things on the floor that can make you trip. What can I do in the bedroom? Use night lights. Make sure that you have a light by your bed that is easy to reach. Do not use any sheets or blankets that are too big for your bed. They should not hang down onto the floor. Have a firm chair that has side arms. You can use this for support while you get dressed. Do not have throw rugs and other things  on the floor that can make you trip. What can I do in the kitchen? Clean up any spills right away. Avoid walking on wet floors. Keep items that you use a lot in easy-to-reach places. If you need to reach something above you, use a strong step stool that has a grab bar. Keep electrical cords out of the way. Do not use floor polish or wax that makes floors slippery. If you must use wax, use non-skid floor wax. Do not have throw rugs and other things on the floor that can make you trip. What can I do with my stairs? Do not leave any items on the stairs. Make sure that there are handrails on both sides of the stairs and use them. Fix handrails that are broken or loose. Make sure that handrails are as long as the stairways. Check any carpeting to make sure that it is firmly attached to the stairs. Fix any carpet that is loose or worn. Avoid having throw rugs at the top or bottom of the stairs. If  you do have throw rugs, attach them to the floor with carpet tape. Make sure that you have a light switch at the top of the stairs and the bottom of the stairs. If you do not have them, ask someone to add them for you. What else can I do to help prevent falls? Wear shoes that: Do not have high heels. Have rubber bottoms. Are comfortable and fit you well. Are closed at the toe. Do not wear sandals. If you use a stepladder: Make sure that it is fully opened. Do not climb a closed stepladder. Make sure that both sides of the stepladder are locked into place. Ask someone to hold it for you, if possible. Clearly mark and make sure that you can see: Any grab bars or handrails. First and last steps. Where the edge of each step is. Use tools that help you move around (mobility aids) if they are needed. These include: Canes. Walkers. Scooters. Crutches. Turn on the lights when you go into a dark area. Replace any light bulbs as soon as they burn out. Set up your furniture so you have a clear path. Avoid moving your furniture around. If any of your floors are uneven, fix them. If there are any pets around you, be aware of where they are. Review your medicines with your doctor. Some medicines can make you feel dizzy. This can increase your chance of falling. Ask your doctor what other things that you can do to help prevent falls. This information is not intended to replace advice given to you by your health care provider. Make sure you discuss any questions you have with your health care provider. Document Released: 05/05/2009 Document Revised: 12/15/2015 Document Reviewed: 08/13/2014 Elsevier Interactive Patient Education  2017 [employer].

## 2022-11-30 ENCOUNTER — Encounter: Payer: Medicare Other | Admitting: Physician Assistant

## 2022-12-03 ENCOUNTER — Ambulatory Visit (INDEPENDENT_AMBULATORY_CARE_PROVIDER_SITE_OTHER): Payer: Medicare Other | Admitting: Nurse Practitioner

## 2022-12-03 ENCOUNTER — Encounter: Payer: Self-pay | Admitting: Nurse Practitioner

## 2022-12-03 VITALS — BP 122/80 | HR 73 | Wt 155.6 lb

## 2022-12-03 DIAGNOSIS — M2550 Pain in unspecified joint: Secondary | ICD-10-CM | POA: Diagnosis not present

## 2022-12-03 DIAGNOSIS — T783XXA Angioneurotic edema, initial encounter: Secondary | ICD-10-CM

## 2022-12-03 DIAGNOSIS — R7303 Prediabetes: Secondary | ICD-10-CM | POA: Diagnosis not present

## 2022-12-03 DIAGNOSIS — M2540 Effusion, unspecified joint: Secondary | ICD-10-CM | POA: Insufficient documentation

## 2022-12-03 DIAGNOSIS — R519 Headache, unspecified: Secondary | ICD-10-CM

## 2022-12-03 DIAGNOSIS — R229 Localized swelling, mass and lump, unspecified: Secondary | ICD-10-CM

## 2022-12-03 DIAGNOSIS — E559 Vitamin D deficiency, unspecified: Secondary | ICD-10-CM | POA: Diagnosis not present

## 2022-12-03 DIAGNOSIS — G8929 Other chronic pain: Secondary | ICD-10-CM

## 2022-12-03 DIAGNOSIS — R202 Paresthesia of skin: Secondary | ICD-10-CM | POA: Diagnosis not present

## 2022-12-03 DIAGNOSIS — E782 Mixed hyperlipidemia: Secondary | ICD-10-CM | POA: Diagnosis not present

## 2022-12-03 DIAGNOSIS — T783XXS Angioneurotic edema, sequela: Secondary | ICD-10-CM

## 2022-12-03 DIAGNOSIS — G5603 Carpal tunnel syndrome, bilateral upper limbs: Secondary | ICD-10-CM

## 2022-12-03 DIAGNOSIS — R234 Changes in skin texture: Secondary | ICD-10-CM | POA: Diagnosis not present

## 2022-12-03 DIAGNOSIS — L659 Nonscarring hair loss, unspecified: Secondary | ICD-10-CM

## 2022-12-03 HISTORY — DX: Other chronic pain: G89.29

## 2022-12-03 HISTORY — DX: Angioneurotic edema, initial encounter: T78.3XXA

## 2022-12-03 HISTORY — DX: Changes in skin texture: R23.4

## 2022-12-03 HISTORY — DX: Localized swelling, mass and lump, unspecified: R22.9

## 2022-12-03 HISTORY — DX: Headache, unspecified: R51.9

## 2022-12-03 MED ORDER — MELOXICAM 15 MG PO TABS: 15.0000 mg | ORAL_TABLET | Freq: Every day | ORAL | 3 refills | Status: DC

## 2022-12-03 MED ORDER — CYCLOBENZAPRINE HCL 5 MG PO TABS: 5.0000 mg | ORAL_TABLET | Freq: Every day | ORAL | 2 refills | Status: DC

## 2022-12-03 NOTE — Patient Instructions (Addendum)
We will check your labs today to see if we can find an autoimmune or illness that could be contributing. I will do some research to see if I can find anything else that this could be.   I would like to try if meloxicam or flexeril can help with the pain you are experiencing to at least help you through your day.

## 2022-12-03 NOTE — Progress Notes (Signed)
Tiffany Clamp, DNP, AGNP-c [facility]  [address] [place], [place] [address] [phone]  ESTABLISHED PATIENT- Chronic Health and/or Follow-Up Visit  Blood pressure 122/80, pulse 73, weight 155 lb 9.6 oz (70.6 kg), last menstrual period 11/30/2022, not currently breastfeeding.    Tiffany Juarez is a 42 y.o. year old female presenting today for evaluation and management of chronic conditions.   Tiffany Juarez presents today with chief complaints of generalized pain in her hips, feet, and midsection, which began during her pregnancy nine years ago. The pain had subsided for about six months but has recently returned with increased severity. She describes difficulty with activities such as walking up steps and sitting for extended periods due to the pain. Tiffany Juarez also mentions severe sleep disruption due to discomfort.  She has developed itchy spots on her feet and hands approximately five to six years ago, which she describes as feeling like bug bites before turning into hard knots. Despite consulting a neurologist and trying gabapentin, she found no relief from this symptom.  Tiffany Juarez has undergone testing for Lyme disease and had an ANA test, both yielding negative results. She is currently experiencing headaches, constant neck pain, and skin peeling on her hands and feet. She also has carpal tunnel syndrome, contributing to weakness and numbness in her hands.  Tiffany Juarez mentions having joint pain and swelling, particularly in her knees and ankles, with the pain usually being symmetrical. She has not noticed any changes in her vision but reports difficulty in dressing and showering due to pain. She has tried various home remedies like heating pads, ice packs, and Epsom salt baths without significant relief. Ibuprofen provides some reduction in swelling but does not alleviate itching.  She has a family history suggestive of similar symptoms in her younger sister and Lyme disease in her father.  Tiffany Juarez describes her hip pain as feeling like "bone rubbing on bone," with the pain radiating into her buttocks. She has tried using mattress pads, which only offered temporary relief.  Tiffany Juarez also expresses significant concern about her hair loss, which has accelerated in the past three months, and changes in the growth of body hair. She is distressed by her appearance and the impact of her symptoms on her daily life, including extreme fatigue that was particularly severe during and after her pregnancy. She describes this fatigue as debilitating, limiting her ability to perform daily activities beyond basic necessities.  All ROS negative with exception of what is listed above.   PHYSICAL EXAM Physical Exam Vitals and nursing note reviewed.  Constitutional:      Appearance: Normal appearance.  HENT:     Head: Normocephalic.  Eyes:     General: No scleral icterus.    Conjunctiva/sclera: Conjunctivae normal.  Neck:     Vascular: No carotid bruit.  Cardiovascular:     Rate and Rhythm: Normal rate and regular rhythm.     Pulses: Normal pulses.     Heart sounds: Normal heart sounds.  Pulmonary:     Effort: Pulmonary effort is normal.     Breath sounds: Normal breath sounds.  Abdominal:     General: Bowel sounds are normal.     Palpations: Abdomen is soft.  Musculoskeletal:        General: Swelling present.     Cervical back: Normal range of motion.     Right lower leg: No edema.     Left lower leg: No edema.     Comments: Bilateral knees and ankle joints  Lymphadenopathy:  Cervical: No cervical adenopathy.  Skin:    General: Skin is warm and dry.     Capillary Refill: Capillary refill takes less than 2 seconds.     Findings: Erythema present.     Comments: Peeling skin noted on hands bilaterally.   Neurological:     General: No focal deficit present.     Mental Status: She is alert and oriented to person, place, and time.  Psychiatric:        Mood and Affect: Mood normal.      PLAN Problem List Items Addressed This Visit     Bilateral carpal tunnel syndrome    Reports symptoms consistent with carpal tunnel syndrome, including weakness and numbness in hands.  Plan: - Recommend wrist splints to be worn during the night and during activities that exacerbate symptoms. - Consider referral to a specialist for further evaluation and possible corticosteroid injections if symptoms persist or worsen.      Relevant Medications   cyclobenzaprine (FLEXERIL) 5 MG tablet   Mixed hyperlipidemia    History of elevated lipids with no current medication management. Plan: - Monitor labs today for evaluation - Recommendations for diet and exercise including at least 150 minutes of moderate intensity physical activity and low carbohydrate, low-fat food choices recommended - Consider statin therapy based on labs.      Relevant Medications   meloxicam (MOBIC) 15 MG tablet   cyclobenzaprine (FLEXERIL) 5 MG tablet   Other Relevant Orders   Vitamin B12 (Completed)   CBC with Differential/Platelet (Completed)   Comprehensive metabolic panel (Completed)   VITAMIN D 25 Hydroxy (Vit-D Deficiency, Fractures) (Completed)   TSH (Completed)   ANA, IFA (with reflex) (Completed)   CMV abs, IgG+IgM (cytomegalovirus) (Completed)   C-reactive protein (Completed)   Sedimentation Rate (Completed)   Rheumatoid factor (Completed)   CYCLIC CITRUL PEPTIDE ANTIBODY, IGG/IGA (Completed)   Lyme Disease Serology w/Reflex (Completed)   Peeling skin    Experiences skin peeling on hands and feet, and rashes that are painful and itchy. This condition impacts quality of life and daily activities.  Plan: - Refer to a dermatologist for a detailed assessment and targeted treatment options. - Advise to continue using moisturizers to manage peeling and to avoid any known irritants that may exacerbate the condition.      Relevant Medications   meloxicam (MOBIC) 15 MG tablet   cyclobenzaprine  (FLEXERIL) 5 MG tablet   Other Relevant Orders   Vitamin B12 (Completed)   CBC with Differential/Platelet (Completed)   Comprehensive metabolic panel (Completed)   VITAMIN D 25 Hydroxy (Vit-D Deficiency, Fractures) (Completed)   TSH (Completed)   ANA, IFA (with reflex) (Completed)   CMV abs, IgG+IgM (cytomegalovirus) (Completed)   C-reactive protein (Completed)   Sedimentation Rate (Completed)   Rheumatoid factor (Completed)   CYCLIC CITRUL PEPTIDE ANTIBODY, IGG/IGA (Completed)   Lyme Disease Serology w/Reflex (Completed)   Arthralgia - Primary    Presents with chronic, generalized pain affecting multiple areas including hips, feet, and midsection, which began during pregnancy and has recently intensified. Reports joint pain and swelling, particularly in the knees and ankles, with pain described as bone rubbing on bone. The pain is symmetrical and has been unresponsive to previous treatments including gabapentin. Also experiences skin issues, including peeling and rashes, and significant fatigue. Negative tests for Lyme disease and ANA suggest ruling out some common autoimmune conditions, but the possibility of other autoimmune disorders remains.  Plan: - Initiate treatment with Meloxicam for anti-inflammatory effects. Start  with Meloxicam 15 mg once daily. - Prescribe Cyclobenzaprine as a muscle relaxant to help manage muscle spasms and related discomfort. Start with Cyclobenzaprine 10 mg at bedtime. - Conduct comprehensive lab tests including CBC, ESR, CRP, Rheumatoid Factor, and anti-CCP to further investigate potential autoimmune etiology. - Monitor symptoms and report back, particularly in response to the new medication regimen.      Relevant Medications   meloxicam (MOBIC) 15 MG tablet   cyclobenzaprine (FLEXERIL) 5 MG tablet   Other Relevant Orders   Vitamin B12 (Completed)   CBC with Differential/Platelet (Completed)   Comprehensive metabolic panel (Completed)   VITAMIN D 25  Hydroxy (Vit-D Deficiency, Fractures) (Completed)   TSH (Completed)   ANA, IFA (with reflex) (Completed)   CMV abs, IgG+IgM (cytomegalovirus) (Completed)   C-reactive protein (Completed)   Sedimentation Rate (Completed)   Rheumatoid factor (Completed)   CYCLIC CITRUL PEPTIDE ANTIBODY, IGG/IGA (Completed)   Lyme Disease Serology w/Reflex (Completed)   Hair loss    Reports significant hair loss, which is causing distress. This may be related to overall health issues, including potential nutritional deficiencies or underlying disease processes.  Plan: - Order lab tests to assess thyroid function (TSH, Free T4) and nutritional status including iron levels, vitamin D, and B12 to identify any deficiencies that may be contributing to hair loss. - Depending on lab results, consider appropriate supplementation or further interventions.      Relevant Medications   meloxicam (MOBIC) 15 MG tablet   cyclobenzaprine (FLEXERIL) 5 MG tablet   Other Relevant Orders   Vitamin B12 (Completed)   CBC with Differential/Platelet (Completed)   Comprehensive metabolic panel (Completed)   VITAMIN D 25 Hydroxy (Vit-D Deficiency, Fractures) (Completed)   TSH (Completed)   ANA, IFA (with reflex) (Completed)   CMV abs, IgG+IgM (cytomegalovirus) (Completed)   C-reactive protein (Completed)   Sedimentation Rate (Completed)   Rheumatoid factor (Completed)   CYCLIC CITRUL PEPTIDE ANTIBODY, IGG/IGA (Completed)   Lyme Disease Serology w/Reflex (Completed)   Chronic intractable headache   Relevant Medications   meloxicam (MOBIC) 15 MG tablet   cyclobenzaprine (FLEXERIL) 5 MG tablet   Other Relevant Orders   Vitamin B12 (Completed)   CBC with Differential/Platelet (Completed)   Comprehensive metabolic panel (Completed)   VITAMIN D 25 Hydroxy (Vit-D Deficiency, Fractures) (Completed)   TSH (Completed)   ANA, IFA (with reflex) (Completed)   CMV abs, IgG+IgM (cytomegalovirus) (Completed)   C-reactive protein  (Completed)   Sedimentation Rate (Completed)   Rheumatoid factor (Completed)   CYCLIC CITRUL PEPTIDE ANTIBODY, IGG/IGA (Completed)   Lyme Disease Serology w/Reflex (Completed)   Multiple skin nodules   Relevant Medications   meloxicam (MOBIC) 15 MG tablet   cyclobenzaprine (FLEXERIL) 5 MG tablet   Other Relevant Orders   Vitamin B12 (Completed)   CBC with Differential/Platelet (Completed)   Comprehensive metabolic panel (Completed)   VITAMIN D 25 Hydroxy (Vit-D Deficiency, Fractures) (Completed)   TSH (Completed)   ANA, IFA (with reflex) (Completed)   CMV abs, IgG+IgM (cytomegalovirus) (Completed)   C-reactive protein (Completed)   Sedimentation Rate (Completed)   Rheumatoid factor (Completed)   CYCLIC CITRUL PEPTIDE ANTIBODY, IGG/IGA (Completed)   Lyme Disease Serology w/Reflex (Completed)   Angio-edema   Relevant Medications   meloxicam (MOBIC) 15 MG tablet   cyclobenzaprine (FLEXERIL) 5 MG tablet   Other Relevant Orders   Vitamin B12 (Completed)   CBC with Differential/Platelet (Completed)   Comprehensive metabolic panel (Completed)   VITAMIN D 25 Hydroxy (Vit-D Deficiency, Fractures) (Completed)  TSH (Completed)   ANA, IFA (with reflex) (Completed)   CMV abs, IgG+IgM (cytomegalovirus) (Completed)   C-reactive protein (Completed)   Sedimentation Rate (Completed)   Rheumatoid factor (Completed)   CYCLIC CITRUL PEPTIDE ANTIBODY, IGG/IGA (Completed)   Lyme Disease Serology w/Reflex (Completed)   Prediabetes   Relevant Medications   meloxicam (MOBIC) 15 MG tablet   cyclobenzaprine (FLEXERIL) 5 MG tablet   Other Relevant Orders   Vitamin B12 (Completed)   CBC with Differential/Platelet (Completed)   Comprehensive metabolic panel (Completed)   VITAMIN D 25 Hydroxy (Vit-D Deficiency, Fractures) (Completed)   TSH (Completed)   ANA, IFA (with reflex) (Completed)   CMV abs, IgG+IgM (cytomegalovirus) (Completed)   C-reactive protein (Completed)   Sedimentation Rate  (Completed)   Rheumatoid factor (Completed)   CYCLIC CITRUL PEPTIDE ANTIBODY, IGG/IGA (Completed)   Lyme Disease Serology w/Reflex (Completed)   Vitamin D deficiency   Relevant Medications   meloxicam (MOBIC) 15 MG tablet   cyclobenzaprine (FLEXERIL) 5 MG tablet   Other Relevant Orders   Vitamin B12 (Completed)   CBC with Differential/Platelet (Completed)   Comprehensive metabolic panel (Completed)   VITAMIN D 25 Hydroxy (Vit-D Deficiency, Fractures) (Completed)   TSH (Completed)   ANA, IFA (with reflex) (Completed)   CMV abs, IgG+IgM (cytomegalovirus) (Completed)   C-reactive protein (Completed)   Sedimentation Rate (Completed)   Rheumatoid factor (Completed)   CYCLIC CITRUL PEPTIDE ANTIBODY, IGG/IGA (Completed)   Lyme Disease Serology w/Reflex (Completed)   Paresthesia   Relevant Medications   meloxicam (MOBIC) 15 MG tablet   cyclobenzaprine (FLEXERIL) 5 MG tablet   Other Relevant Orders   Vitamin B12 (Completed)   CBC with Differential/Platelet (Completed)   Comprehensive metabolic panel (Completed)   VITAMIN D 25 Hydroxy (Vit-D Deficiency, Fractures) (Completed)   TSH (Completed)   ANA, IFA (with reflex) (Completed)   CMV abs, IgG+IgM (cytomegalovirus) (Completed)   C-reactive protein (Completed)   Sedimentation Rate (Completed)   Rheumatoid factor (Completed)   CYCLIC CITRUL PEPTIDE ANTIBODY, IGG/IGA (Completed)   Lyme Disease Serology w/Reflex (Completed)    Return in about 6 months (around 06/05/2023) for CPE.   Tiffany Clamp, DNP, AGNP-c 12/03/2022 11:37 AM

## 2022-12-05 ENCOUNTER — Other Ambulatory Visit: Payer: Self-pay | Admitting: Nurse Practitioner

## 2022-12-05 DIAGNOSIS — E559 Vitamin D deficiency, unspecified: Secondary | ICD-10-CM

## 2022-12-05 MED ORDER — VITAMIN D (ERGOCALCIFEROL) 1.25 MG (50000 UNIT) PO CAPS: 50000.0000 [IU] | ORAL_CAPSULE | ORAL | 3 refills | Status: DC

## 2022-12-06 LAB — COMPREHENSIVE METABOLIC PANEL
ALT: 14 IU/L (ref 0–32)
AST: 15 IU/L (ref 0–40)
Albumin/Globulin Ratio: 2 (ref 1.2–2.2)
Albumin: 4.5 g/dL (ref 3.9–4.9)
Alkaline Phosphatase: 74 IU/L (ref 44–121)
BUN/Creatinine Ratio: 11 (ref 9–23)
BUN: 10 mg/dL (ref 6–24)
Bilirubin Total: 0.3 mg/dL (ref 0.0–1.2)
CO2: 20 mmol/L (ref 20–29)
Calcium: 9.5 mg/dL (ref 8.7–10.2)
Chloride: 103 mmol/L (ref 96–106)
Creatinine, Ser: 0.88 mg/dL (ref 0.57–1.00)
Globulin, Total: 2.2 g/dL (ref 1.5–4.5)
Glucose: 83 mg/dL (ref 70–99)
Potassium: 4.7 mmol/L (ref 3.5–5.2)
Sodium: 140 mmol/L (ref 134–144)
Total Protein: 6.7 g/dL (ref 6.0–8.5)
eGFR: 85 mL/min/{1.73_m2} (ref >59)

## 2022-12-06 LAB — CBC WITH DIFFERENTIAL/PLATELET
Basophils Absolute: 0 10*3/uL (ref 0.0–0.2)
Basos: 0 %
EOS (ABSOLUTE): 0.4 10*3/uL (ref 0.0–0.4)
Eos: 7 %
Hematocrit: 40.1 % (ref 34.0–46.6)
Hemoglobin: 12.9 g/dL (ref 11.1–15.9)
Immature Grans (Abs): 0 10*3/uL (ref 0.0–0.1)
Immature Granulocytes: 0 %
Lymphocytes Absolute: 2.4 10*3/uL (ref 0.7–3.1)
Lymphs: 41 %
MCH: 27 pg (ref 26.6–33.0)
MCHC: 32.2 g/dL (ref 31.5–35.7)
MCV: 84 fL (ref 79–97)
Monocytes Absolute: 0.3 10*3/uL (ref 0.1–0.9)
Monocytes: 5 %
Neutrophils Absolute: 2.7 10*3/uL (ref 1.4–7.0)
Neutrophils: 47 %
Platelets: 243 10*3/uL (ref 150–450)
RBC: 4.77 x10E6/uL (ref 3.77–5.28)
RDW: 15.6 % — ABNORMAL HIGH (ref 11.7–15.4)
WBC: 5.9 10*3/uL (ref 3.4–10.8)

## 2022-12-06 LAB — CYCLIC CITRUL PEPTIDE ANTIBODY, IGG/IGA: Cyclic Citrullin Peptide Ab: 7 units (ref 0–19)

## 2022-12-06 LAB — C-REACTIVE PROTEIN: CRP: 7 mg/L (ref 0–10)

## 2022-12-06 LAB — CMV ABS, IGG+IGM (CYTOMEGALOVIRUS)
CMV Ab - IgG: 1.8 U/mL — ABNORMAL HIGH (ref 0.00–0.59)
CMV IgM Ser EIA-aCnc: <30 AU/mL (ref 0.0–29.9)

## 2022-12-06 LAB — ANTINUCLEAR ANTIBODIES, IFA: ANA Titer 1: NEGATIVE

## 2022-12-06 LAB — VITAMIN D 25 HYDROXY (VIT D DEFICIENCY, FRACTURES): Vit D, 25-Hydroxy: 16.6 ng/mL — ABNORMAL LOW (ref 30.0–100.0)

## 2022-12-06 LAB — RHEUMATOID FACTOR: Rheumatoid fact SerPl-aCnc: <10 IU/mL (ref <14.0)

## 2022-12-06 LAB — LYME DISEASE SEROLOGY W/REFLEX: Lyme Total Antibody EIA: NEGATIVE

## 2022-12-06 LAB — SEDIMENTATION RATE: Sed Rate: 5 mm/hr (ref 0–32)

## 2022-12-06 LAB — TSH: TSH: 1.64 u[IU]/mL (ref 0.450–4.500)

## 2022-12-06 LAB — VITAMIN B12: Vitamin B-12: 693 pg/mL (ref 232–1245)

## 2022-12-19 ENCOUNTER — Encounter: Payer: Self-pay | Admitting: Nurse Practitioner

## 2022-12-19 NOTE — Assessment & Plan Note (Signed)
Reports significant hair loss, which is causing distress. This may be related to overall health issues, including potential nutritional deficiencies or underlying disease processes.  Plan: - Order lab tests to assess thyroid function (TSH, Free T4) and nutritional status including iron levels, vitamin D, and B12 to identify any deficiencies that may be contributing to hair loss. - Depending on lab results, consider appropriate supplementation or further interventions.

## 2022-12-19 NOTE — Assessment & Plan Note (Signed)
Reports symptoms consistent with carpal tunnel syndrome, including weakness and numbness in hands.  Plan: - Recommend wrist splints to be worn during the night and during activities that exacerbate symptoms. - Consider referral to a specialist for further evaluation and possible corticosteroid injections if symptoms persist or worsen.

## 2022-12-19 NOTE — Assessment & Plan Note (Signed)
Presents with chronic, generalized pain affecting multiple areas including hips, feet, and midsection, which began during pregnancy and has recently intensified. Reports joint pain and swelling, particularly in the knees and ankles, with pain described as bone rubbing on bone. The pain is symmetrical and has been unresponsive to previous treatments including gabapentin. Also experiences skin issues, including peeling and rashes, and significant fatigue. Negative tests for Lyme disease and ANA suggest ruling out some common autoimmune conditions, but the possibility of other autoimmune disorders remains.  Plan: - Initiate treatment with Meloxicam for anti-inflammatory effects. Start with Meloxicam 15 mg once daily. - Prescribe Cyclobenzaprine as a muscle relaxant to help manage muscle spasms and related discomfort. Start with Cyclobenzaprine 10 mg at bedtime. - Conduct comprehensive lab tests including CBC, ESR, CRP, Rheumatoid Factor, and anti-CCP to further investigate potential autoimmune etiology. - Monitor symptoms and report back, particularly in response to the new medication regimen.

## 2022-12-19 NOTE — Assessment & Plan Note (Signed)
Experiences skin peeling on hands and feet, and rashes that are painful and itchy. This condition impacts quality of life and daily activities.  Plan: - Refer to a dermatologist for a detailed assessment and targeted treatment options. - Advise to continue using moisturizers to manage peeling and to avoid any known irritants that may exacerbate the condition.

## 2022-12-19 NOTE — Assessment & Plan Note (Signed)
History of elevated lipids with no current medication management. Plan: - Monitor labs today for evaluation - Recommendations for diet and exercise including at least 150 minutes of moderate intensity physical activity and low carbohydrate, low-fat food choices recommended - Consider statin therapy based on labs.

## 2022-12-27 DIAGNOSIS — F3131 Bipolar disorder, current episode depressed, mild: Secondary | ICD-10-CM | POA: Diagnosis not present

## 2023-01-04 DIAGNOSIS — F3131 Bipolar disorder, current episode depressed, mild: Secondary | ICD-10-CM | POA: Diagnosis not present

## 2023-03-08 DIAGNOSIS — F3131 Bipolar disorder, current episode depressed, mild: Secondary | ICD-10-CM | POA: Diagnosis not present

## 2023-03-21 ENCOUNTER — Ambulatory Visit (INDEPENDENT_AMBULATORY_CARE_PROVIDER_SITE_OTHER): Payer: Medicare Other | Admitting: Nurse Practitioner

## 2023-03-21 ENCOUNTER — Encounter: Payer: Self-pay | Admitting: Nurse Practitioner

## 2023-03-21 VITALS — BP 122/82 | HR 78 | Ht 61.0 in | Wt 161.8 lb

## 2023-03-21 DIAGNOSIS — L509 Urticaria, unspecified: Secondary | ICD-10-CM

## 2023-03-21 DIAGNOSIS — M2540 Effusion, unspecified joint: Secondary | ICD-10-CM

## 2023-03-21 DIAGNOSIS — R7303 Prediabetes: Secondary | ICD-10-CM | POA: Diagnosis not present

## 2023-03-21 DIAGNOSIS — Z0000 Encounter for general adult medical examination without abnormal findings: Secondary | ICD-10-CM

## 2023-03-21 DIAGNOSIS — E559 Vitamin D deficiency, unspecified: Secondary | ICD-10-CM

## 2023-03-21 DIAGNOSIS — E782 Mixed hyperlipidemia: Secondary | ICD-10-CM | POA: Diagnosis not present

## 2023-03-21 MED ORDER — METHOCARBAMOL 750 MG PO TABS: 750.0000 mg | ORAL_TABLET | Freq: Four times a day (QID) | ORAL | 2 refills | Status: AC | PRN

## 2023-03-21 MED ORDER — DULOXETINE HCL 30 MG PO CPEP
30.0000 mg | ORAL_CAPSULE | Freq: Every day | ORAL | 12 refills | Status: DC
Start: 2023-03-21 — End: 2024-03-24

## 2023-03-21 NOTE — Progress Notes (Signed)
[name], DNP, AGNP-c [facility] [address] [place], [place] [address] Main Office [phone]  BP 122/82   Pulse 78   Ht 5\' 1"  (1.549 m)   Wt 161 lb 12.8 oz (73.4 kg)   LMP 03/02/2023   BMI 30.57 kg/m    Subjective:    Patient ID: Tiffany Juarez, female    DOB: [birth year], 42 y.o.   MRN: [id number]  HPI: Tiffany Juarez is a 42 y.o. female presenting on 03/21/2023 for comprehensive medical examination.   Current medical concerns include: She reports ongoing concerns with swelling in random areas of the body including her mouth, thumb, knee, toe, and other locations. She reports the swelling will start randomly and resolves on it's own. The swelling can occur on the skin surface as hives or in the tissues. She reports typically she will note itching in the area before the swelling begins.  She tells me she will get swelling in her lymph nodes in her neck and this resolves without intervention. She has also had incidences of sore throat that come and go and seem to correlate with the swelling.  She first noticed this about 6.5 years ago, shortly after moving to [place]. She reports the symptoms typically last 1-3 days and then resolve spontaneously. She reports there is no correlation with food, activity, lotion, body wash, etc. She has not found anything to make it better and does not know what triggers it. She reports ongoing pain and soft tissue tenderness present around the time of the flairs. She has not lived in the same location since moving to [facility]. She does have pets, but is around these daily without daily occurrences. She tells me the month of August the outbreaks were continuous. She was unable to wear anything other than baggy clothes due to pain.  She tells me the pain is primarily in her lower extremities. She has been seen by neurology and has tried lyrica before and there was no help to the symptoms.     Pertinent items are noted in HPI.  IMMUNIZATIONS:   Flu:  Flu vaccine postponed until flu season Prevnar 13: Prevnar 13 N/A for this patient Prevnar 20: Prevnar 20 N/A for this patient Pneumovax 23: Pneumovax 23 N/A for this patient Vac Shingrix: Shingrix N/A for this patient HPV: HPV N/A for this patient Tetanus: Tetanus completed in the last 10 years COVID: recommended new vaccine  HEALTH MAINTENANCE: Pap Smear HM Status: is up to date Mammogram HM Status: is not applicable for this patient Colon Cancer Screening HM Status: is not applicable for this patient Bone Density HM Status: is not applicable for this patient STI Testing HM Status: was declined  Lung CT HM Status: is not applicable for this patient  She denies concerns with vision, hearing, or dentition.   Most Recent Depression Screen:     03/21/2023    9:12 AM 11/20/2022   10:31 AM 11/27/2021   10:02 AM 11/10/2021   10:02 AM 11/17/2020    9:15 AM  Depression screen PHQ 2/9  Decreased Interest 0 1 3 3 3   Down, Depressed, Hopeless 0 3 3 3 3   PHQ - 2 Score 0 4 6 6 6   Altered sleeping  2 3 3 3   Tired, decreased energy  2 3 3 3   Change in appetite  3 1 3 3   Feeling bad or failure about yourself   0 1 1 0  Trouble concentrating  0 1 0 3  Moving slowly or  fidgety/restless  0 1 0 3  Suicidal thoughts  0 2 0 0  PHQ-9 Score  11 18 16 21   Difficult doing work/chores  Somewhat difficult Very difficult Somewhat difficult Very difficult   Most Recent Anxiety Screen:     11/13/2017   11:04 AM 06/17/2017   10:08 AM 04/22/2017    9:36 AM 03/01/2017   11:20 AM  GAD 7 : Generalized Anxiety Score  Nervous, Anxious, on Edge 0 3 2 3   Control/stop worrying 0 3 0 1  Worry too much - different things 0 3 1 2   Trouble relaxing 1 3 3 3   Restless 1 1 3 3   Easily annoyed or irritable 1 3 1 3   Afraid - awful might happen 0 3 1 1   Total GAD 7 Score 3 19 11 16    Most Recent Fall Screen:    03/21/2023    9:11 AM 11/20/2022   10:30 AM 11/27/2021   10:01 AM 11/10/2021   10:02 AM 11/17/2020    9:15  AM  Fall Risk   Falls in the past year? 0 0 0 0 0  Number falls in past yr: 0 0 0 0 0  Injury with Fall? 0 0 0 0 0  Risk for fall due to : No Fall Risks Medication side effect No Fall Risks Medication side effect No Fall Risks  Follow up Falls evaluation completed Falls prevention discussed;Education provided;Falls evaluation completed Falls evaluation completed Falls evaluation completed;Education provided;Falls prevention discussed Falls evaluation completed    Past medical history, surgical history, medications, allergies, family history and social history reviewed with patient today and changes made to appropriate areas of the chart.  Past Medical History:  Past Medical History:  Diagnosis Date   Alteration consciousness 05/26/2018   Anginal pain (HCC)    Angio-edema 12/03/2022   Bipolar disorder (HCC)    Chronic intractable headache 12/03/2022   Class 1 obesity with serious comorbidity and body mass index (BMI) of 31.0 to 31.9 in adult 11/27/2021   Depression    Dry skin 11/17/2019   Fibroid    Fibromyalgia    Finger numbness 02/15/2021   Multiple skin nodules 12/03/2022   Night sweats 11/17/2019   Numbness and tingling    Obesity (BMI 30-39.9) 11/17/2019   Pain in both wrists 02/15/2021   Paresthesia 10/09/2017   Peeling skin 12/03/2022   Prediabetes 11/06/2018   Pure hypercholesterolemia 11/17/2019   Smoker 11/17/2019   Medications:  Current Outpatient Medications on File Prior to Visit  Medication Sig   busPIRone (BUSPAR) 15 MG tablet Take 15 mg by mouth 3 (three) times daily.   lurasidone (LATUDA) 20 MG TABS tablet Take by mouth.   Vitamin D, Ergocalciferol, (DRISDOL) 1.25 MG (50000 UNIT) CAPS capsule Take 1 capsule (50,000 Units total) by mouth every 7 (seven) days.   No current facility-administered medications on file prior to visit.   Surgical History:  Past Surgical History:  Procedure Laterality Date   CESAREAN SECTION     CESAREAN SECTION N/A 01/19/2021    Procedure: CESAREAN SECTION;  Surgeon: Myna Hidalgo, DO;  Location: [facility] LD ORS;  Service: Obstetrics;  Laterality: N/A;   wisdom teeth     Allergies:  Allergies  Allergen Reactions   Cephalosporins Anaphylaxis   Peanut-Containing Drug Products Anaphylaxis   Penicillins Anaphylaxis   Family History:  Family History  Problem Relation Age of Onset   Depression Father    Schizophrenia Father    Depression Brother  Healthy Mother    Hypertension Maternal Grandmother    Cancer Maternal Grandmother    Diabetes Maternal Aunt        Objective:    BP 122/82   Pulse 78   Ht 5\' 1"  (1.549 m)   Wt 161 lb 12.8 oz (73.4 kg)   LMP 03/02/2023   BMI 30.57 kg/m   Wt Readings from Last 3 Encounters:  03/21/23 161 lb 12.8 oz (73.4 kg)  12/03/22 155 lb 9.6 oz (70.6 kg)  11/20/22 150 lb (68 kg)    Physical Exam Vitals and nursing note reviewed.  Constitutional:      General: She is not in acute distress.    Appearance: Normal appearance.  HENT:     Head: Normocephalic and atraumatic.     Right Ear: Hearing, tympanic membrane, ear canal and external ear normal.     Left Ear: Hearing, tympanic membrane, ear canal and external ear normal.     Nose: Nose normal.     Right Sinus: No maxillary sinus tenderness or frontal sinus tenderness.     Left Sinus: No maxillary sinus tenderness or frontal sinus tenderness.     Mouth/Throat:     Lips: Pink.     Mouth: Mucous membranes are moist.     Pharynx: Oropharynx is clear.  Eyes:     General: Lids are normal. Vision grossly intact.     Extraocular Movements: Extraocular movements intact.     Conjunctiva/sclera: Conjunctivae normal.     Pupils: Pupils are equal, round, and reactive to light.     Funduscopic exam:    Right eye: Red reflex present.        Left eye: Red reflex present.    Visual Fields: Right eye visual fields normal and left eye visual fields normal.  Neck:     Thyroid: No thyromegaly.     Vascular: No carotid bruit.   Cardiovascular:     Rate and Rhythm: Normal rate and regular rhythm.     Chest Wall: PMI is not displaced.     Pulses: Normal pulses.          Dorsalis pedis pulses are 2+ on the right side and 2+ on the left side.       Posterior tibial pulses are 2+ on the right side and 2+ on the left side.     Heart sounds: Normal heart sounds. No murmur heard. Pulmonary:     Effort: Pulmonary effort is normal. No respiratory distress.     Breath sounds: Normal breath sounds.  Abdominal:     General: Abdomen is flat. Bowel sounds are normal. There is no distension.     Palpations: Abdomen is soft. There is no hepatomegaly, splenomegaly or mass.     Tenderness: There is no abdominal tenderness. There is no right CVA tenderness, left CVA tenderness, guarding or rebound.  Musculoskeletal:        General: Tenderness present. No swelling.     Cervical back: Full passive range of motion without pain, normal range of motion and neck supple. No tenderness.     Right lower leg: No edema.     Left lower leg: No edema.     Comments: Significant tenderness noted on lower extremities with mild weakness noted. Reflexes intact. No rash or swelling noted at this time.   Feet:     Left foot:     Toenail Condition: Left toenails are normal.  Lymphadenopathy:     Cervical: No cervical adenopathy.  Upper Body:     Right upper body: No supraclavicular adenopathy.     Left upper body: No supraclavicular adenopathy.  Skin:    General: Skin is warm and dry.     Capillary Refill: Capillary refill takes less than 2 seconds.     Nails: There is no clubbing.  Neurological:     General: No focal deficit present.     Mental Status: She is alert and oriented to person, place, and time.     GCS: GCS eye subscore is 4. GCS verbal subscore is 5. GCS motor subscore is 6.     Sensory: Sensation is intact. No sensory deficit.     Motor: Motor function is intact. No weakness.     Coordination: Coordination is intact.  Coordination normal.     Gait: Gait abnormal.     Deep Tendon Reflexes: Reflexes are normal and symmetric. Reflexes normal.  Psychiatric:        Attention and Perception: Attention normal.        Mood and Affect: Mood normal.        Speech: Speech normal.        Behavior: Behavior normal. Behavior is cooperative.        Thought Content: Thought content normal.        Cognition and Memory: Cognition and memory normal.        Judgment: Judgment normal.     Results for orders placed or performed in visit on 12/03/22  Vitamin B12  Result Value Ref Range   Vitamin B-12 693 232 - 1,245 pg/mL  CBC with Differential/Platelet  Result Value Ref Range   WBC 5.9 3.4 - 10.8 x10E3/uL   RBC 4.77 3.77 - 5.28 x10E6/uL   Hemoglobin 12.9 11.1 - 15.9 g/dL   Hematocrit 08.6 57.8 - 46.6 %   MCV 84 79 - 97 fL   MCH 27.0 26.6 - 33.0 pg   MCHC 32.2 31.5 - 35.7 g/dL   RDW 46.9 (H) 62.9 - 52.8 %   Platelets 243 150 - 450 x10E3/uL   Neutrophils 47 Not Estab. %   Lymphs 41 Not Estab. %   Monocytes 5 Not Estab. %   Eos 7 Not Estab. %   Basos 0 Not Estab. %   Neutrophils Absolute 2.7 1.4 - 7.0 x10E3/uL   Lymphocytes Absolute 2.4 0.7 - 3.1 x10E3/uL   Monocytes Absolute 0.3 0.1 - 0.9 x10E3/uL   EOS (ABSOLUTE) 0.4 0.0 - 0.4 x10E3/uL   Basophils Absolute 0.0 0.0 - 0.2 x10E3/uL   Immature Granulocytes 0 Not Estab. %   Immature Grans (Abs) 0.0 0.0 - 0.1 x10E3/uL  Comprehensive metabolic panel  Result Value Ref Range   Glucose 83 70 - 99 mg/dL   BUN 10 6 - 24 mg/dL   Creatinine, Ser 4.13 0.57 - 1.00 mg/dL   eGFR 85 >24 MW/NUU/7.25   BUN/Creatinine Ratio 11 9 - 23   Sodium 140 134 - 144 mmol/L   Potassium 4.7 3.5 - 5.2 mmol/L   Chloride 103 96 - 106 mmol/L   CO2 20 20 - 29 mmol/L   Calcium 9.5 8.7 - 10.2 mg/dL   Total Protein 6.7 6.0 - 8.5 g/dL   Albumin 4.5 3.9 - 4.9 g/dL   Globulin, Total 2.2 1.5 - 4.5 g/dL   Albumin/Globulin Ratio 2.0 1.2 - 2.2   Bilirubin Total 0.3 0.0 - 1.2 mg/dL   Alkaline  Phosphatase 74 44 - 121 IU/L   AST 15 0 - 40  IU/L   ALT 14 0 - 32 IU/L  VITAMIN D 25 Hydroxy (Vit-D Deficiency, Fractures)  Result Value Ref Range   Vit D, 25-Hydroxy 16.6 (L) 30.0 - 100.0 ng/mL  TSH  Result Value Ref Range   TSH 1.640 0.450 - 4.500 uIU/mL  ANA, IFA (with reflex)  Result Value Ref Range   ANA Titer 1 Negative   CMV abs, IgG+IgM (cytomegalovirus)  Result Value Ref Range   CMV Ab - IgG 1.80 (H) 0.00 - 0.59 U/mL   CMV IgM Ser EIA-aCnc <30.0 0.0 - 29.9 AU/mL  C-reactive protein  Result Value Ref Range   CRP 7 0 - 10 mg/L  Sedimentation Rate  Result Value Ref Range   Sed Rate 5 0 - 32 mm/hr  Rheumatoid factor  Result Value Ref Range   Rheumatoid fact SerPl-aCnc <10.0 <14.0 IU/mL  CYCLIC CITRUL PEPTIDE ANTIBODY, IGG/IGA  Result Value Ref Range   Cyclic Citrullin Peptide Ab 7 0 - 19 units  Lyme Disease Serology w/Reflex  Result Value Ref Range   Lyme Total Antibody EIA Negative Negative         Assessment & Plan:   Problem List Items Addressed This Visit     Prediabetes    Historic diagnosis. Will monitor labs. Not currently on medication. Recommend diet and exercise management.       Relevant Medications   methocarbamol (ROBAXIN-750) 750 MG tablet   Other Relevant Orders   Hemoglobin A1c   CBC with Differential/Platelet   Comprehensive metabolic panel   Lipid panel   C-reactive protein   Vitamin D deficiency    Labs pending.       Relevant Medications   methocarbamol (ROBAXIN-750) 750 MG tablet   Other Relevant Orders   Hemoglobin A1c   CBC with Differential/Platelet   Comprehensive metabolic panel   Lipid panel   C-reactive protein   Mixed hyperlipidemia    Managed with diet and exercise. Labs pending.       Relevant Medications   methocarbamol (ROBAXIN-750) 750 MG tablet   Other Relevant Orders   Hemoglobin A1c   CBC with Differential/Platelet   Comprehensive metabolic panel   Lipid panel   C-reactive protein   Painful swelling  of joint    Chronic joint and soft tissue pain and swelling with no known etiology. Correlates with hives on the skin surface. The pain has been present consistently this month per the patient, although, she is unable to identify a trigger or reason. We have looked at some autoimmune markers but did not come up with any positive results. She has tested positive for CMV Ab, but this is rather vague. She does appear to be in a considerable amount of pain. I have considered myofascial pain syndrome, but with the other symptoms on the skin I am not completely convinced. I will ask for her to be seen by rheumatology for evaluation to see if they are able to help determine a cause and possible treatment.       Relevant Medications   methocarbamol (ROBAXIN-750) 750 MG tablet   DULoxetine (CYMBALTA) 30 MG capsule   Other Relevant Orders   Hemoglobin A1c   CBC with Differential/Platelet   Comprehensive metabolic panel   Lipid panel   C-reactive protein   Ambulatory referral to Rheumatology   Hives of unknown origin    Intermittent hives with unknown cause. Typically lasting 1-3 days before spontaneous resolution. Correlating with soft tissue swelling in other areas and tenderness of the  joints. At this time it is not clear if her symptoms are related to possible immune mediated cause or myofacial pain syndrome. We will plan to treat with cymbalta 30mg  and methocarbamol for ain. Will monitor cymbalta closely in the setting of additional mental health medications.       Relevant Medications   methocarbamol (ROBAXIN-750) 750 MG tablet   Other Relevant Orders   Hemoglobin A1c   CBC with Differential/Platelet   Comprehensive metabolic panel   Lipid panel   C-reactive protein   Ambulatory referral to Rheumatology   Encounter for annual physical exam - Primary    CPE completed today. Review of HM activities and recommendations discussed and provided on AVS. Anticipatory guidance, diet, and exercise  recommendations provided. Medications, allergies, and hx reviewed and updated as necessary. Orders placed as listed below.  Plan: - Labs ordered. Will make changes as necessary based on results.  - I will review these results and send recommendations via MyChart or a telephone call.  - F/U with CPE in 1 year or sooner for acute/chronic health needs as directed.        Relevant Medications   methocarbamol (ROBAXIN-750) 750 MG tablet   Other Relevant Orders   Hemoglobin A1c   CBC with Differential/Platelet   Comprehensive metabolic panel   Lipid panel   C-reactive protein       Follow up plan: Return in about 1 year (around 03/20/2024) for CPE.  NEXT PREVENTATIVE PHYSICAL DUE IN 1 YEAR.  PATIENT COUNSELING PROVIDED FOR ALL ADULT PATIENTS: A well balanced diet low in saturated fats, cholesterol, and moderation in carbohydrates.  This can be as simple as monitoring portion sizes and cutting back on sugary beverages such as soda and juice to start with.    Daily water consumption of at least 64 ounces.  Physical activity at least 180 minutes per week.  If just starting out, start 10 minutes a day and work your way up.   This can be as simple as taking the stairs instead of the elevator and walking 2-3 laps around the office  purposefully every day.   STD protection, partner selection, and regular testing if high risk.  Limited consumption of alcoholic beverages if alcohol is consumed. For men, I recommend no more than 14 alcoholic beverages per week, spread out throughout the week (max 2 per day). Avoid "binge" drinking or consuming large quantities of alcohol in one setting.  Please let me know if you feel you may need help with reduction or quitting alcohol consumption.   Avoidance of nicotine, if used. Please let me know if you feel you may need help with reduction or quitting nicotine use.   Daily mental health attention. This can be in the form of 5 minute daily  meditation, prayer, journaling, yoga, reflection, etc.  Purposeful attention to your emotions and mental state can significantly improve your overall wellbeing  and  Health.  Please know that I am here to help you with all of your health care goals and am happy to work with you to find a solution that works best for you.  The greatest advice I have received with any changes in life are to take it one step at a time, that even means if all you can focus on is the next 60 seconds, then do that and celebrate your victories.  With any changes in life, you will have set backs, and that is OK. The important thing to remember is, if you have  a set back, it is not a failure, it is an opportunity to try again! Screening Testing Mammogram Every 1 -2 years based on history and risk factors Starting at age 48 Pap Smear Ages 21-39 every 3 years Ages 5-65 every 5 years with HPV testing More frequent testing may be required based on results and history Colon Cancer Screening Every 1-10 years based on test performed, risk factors, and history Starting at age 33 Bone Density Screening Every 2-10 years based on history Starting at age 72 for women Recommendations for men differ based on medication usage, history, and risk factors AAA Screening One time ultrasound Men 19-34 years old who have every smoked Lung Cancer Screening Low Dose Lung CT every 12 months Age 64-80 years with a 30 pack-year smoking history who still smoke or who have quit within the last 15 years   Screening Labs Routine  Labs: Complete Blood Count (CBC), Complete Metabolic Panel (CMP), Cholesterol (Lipid Panel) Every 6-12 months based on history and medications May be recommended more frequently based on current conditions or previous results Hemoglobin A1c Lab Every 3-12 months based on history and previous results Starting at age 32 or earlier with diagnosis of diabetes, high cholesterol, BMI >26, and/or risk  factors Frequent monitoring for patients with diabetes to ensure blood sugar control Thyroid Panel (TSH) Every 6 months based on history, symptoms, and risk factors May be repeated more often if on medication HIV One time testing for all patients 48 and older May be repeated more frequently for patients with increased risk factors or exposure Hepatitis C One time testing for all patients 22 and older May be repeated more frequently for patients with increased risk factors or exposure Gonorrhea, Chlamydia Every 12 months for all sexually active persons 13-24 years Additional monitoring may be recommended for those who are considered high risk or who have symptoms Every 12 months for any woman on birth control, regardless of sexual activity PSA Men 33-97 years old with risk factors Additional screening may be recommended from age 5-69 based on risk factors, symptoms, and history  Vaccine Recommendations Tetanus Booster All adults every 10 years Flu Vaccine All patients 6 months and older every year COVID Vaccine All patients 12 years and older Initial dosing with booster May recommend additional booster based on age and health history HPV Vaccine 2 doses all patients age 72-26 Dosing may be considered for patients over 26 Shingles Vaccine (Shingrix) 2 doses all adults 55 years and older Pneumonia (Pneumovax 64) All adults 65 years and older May recommend earlier dosing based on health history One year apart from Prevnar 77 Pneumonia (Prevnar 38) All adults 65 years and older Dosed 1 year after Pneumovax 23 Pneumonia (Prevnar 20) One time alternative to the two dosing of 13 and 23 For all adults with initial dose of 23, 20 is recommended 1 year later For all adults with initial dose of 13, 23 is still recommended as second option 1 year later

## 2023-03-21 NOTE — Patient Instructions (Signed)
Lab Results or Imaging We will contact you if there are concerns with your results or changes need to be made. If there are new concerns that require significant changes, we will call you to schedule an appointment.   If all labs are considered normal, you will see your results in MyChart without specific comments.  I will respond to imaging as soon as possible. Some results take longer than others. Some results require consultation that may take extra time.   If you have additional questions about your labs or imaging, please feel free to set up a virtual appointment to discuss this further.   MyChart Messages You are welcome to send questions through MyChart. These will be triaged in accordance to urgency and responded to as quickly as possible.   Do not send urgent or emergency concerns through MyChart. I am seeing patients through the day and I am unable to monitor these. If you have a question about the urgency, please call the office.   Due to high volumes, MyChart messages may take up to 7 business days at this time.   If you have new concerns, changes in a current problem, or an ongoing issue that is not resolving, please call the office to schedule an appointment. I am required to bill MyChart messages with complex or new requests.

## 2023-03-21 NOTE — Assessment & Plan Note (Signed)
Intermittent hives with unknown cause. Typically lasting 1-3 days before spontaneous resolution. Correlating with soft tissue swelling in other areas and tenderness of the joints. At this time it is not clear if her symptoms are related to possible immune mediated cause or myofacial pain syndrome. We will plan to treat with cymbalta 30mg  and methocarbamol for ain. Will monitor cymbalta closely in the setting of additional mental health medications.

## 2023-03-21 NOTE — Assessment & Plan Note (Signed)
Labs pending.  

## 2023-03-21 NOTE — Assessment & Plan Note (Signed)
Managed with diet and exercise. Labs pending.

## 2023-03-21 NOTE — Assessment & Plan Note (Signed)
Chronic joint and soft tissue pain and swelling with no known etiology. Correlates with hives on the skin surface. The pain has been present consistently this month per the patient, although, she is unable to identify a trigger or reason. We have looked at some autoimmune markers but did not come up with any positive results. She has tested positive for CMV Ab, but this is rather vague. She does appear to be in a considerable amount of pain. I have considered myofascial pain syndrome, but with the other symptoms on the skin I am not completely convinced. I will ask for her to be seen by rheumatology for evaluation to see if they are able to help determine a cause and possible treatment.

## 2023-03-21 NOTE — Assessment & Plan Note (Signed)
Historic diagnosis. Will monitor labs. Not currently on medication. Recommend diet and exercise management.

## 2023-03-21 NOTE — Assessment & Plan Note (Signed)

## 2023-03-22 LAB — CBC WITH DIFFERENTIAL/PLATELET
Basophils Absolute: 0 10*3/uL (ref 0.0–0.2)
Basos: 0 %
EOS (ABSOLUTE): 0.5 10*3/uL — ABNORMAL HIGH (ref 0.0–0.4)
Eos: 8 %
Hematocrit: 43.7 % (ref 34.0–46.6)
Hemoglobin: 13.4 g/dL (ref 11.1–15.9)
Immature Grans (Abs): 0 10*3/uL (ref 0.0–0.1)
Immature Granulocytes: 0 %
Lymphocytes Absolute: 2.5 10*3/uL (ref 0.7–3.1)
Lymphs: 38 %
MCH: 26.5 pg — ABNORMAL LOW (ref 26.6–33.0)
MCHC: 30.7 g/dL — ABNORMAL LOW (ref 31.5–35.7)
MCV: 86 fL (ref 79–97)
Monocytes Absolute: 0.4 10*3/uL (ref 0.1–0.9)
Monocytes: 6 %
Neutrophils Absolute: 3.3 10*3/uL (ref 1.4–7.0)
Neutrophils: 48 %
Platelets: 278 10*3/uL (ref 150–450)
RBC: 5.06 x10E6/uL (ref 3.77–5.28)
RDW: 14.1 % (ref 11.7–15.4)
WBC: 6.8 10*3/uL (ref 3.4–10.8)

## 2023-03-22 LAB — COMPREHENSIVE METABOLIC PANEL
ALT: 12 IU/L (ref 0–32)
AST: 14 IU/L (ref 0–40)
Albumin: 4.5 g/dL (ref 3.9–4.9)
Alkaline Phosphatase: 81 IU/L (ref 44–121)
BUN/Creatinine Ratio: 10 (ref 9–23)
BUN: 8 mg/dL (ref 6–24)
Bilirubin Total: 0.2 mg/dL (ref 0.0–1.2)
CO2: 23 mmol/L (ref 20–29)
Calcium: 9.3 mg/dL (ref 8.7–10.2)
Chloride: 102 mmol/L (ref 96–106)
Creatinine, Ser: 0.84 mg/dL (ref 0.57–1.00)
Globulin, Total: 2 g/dL (ref 1.5–4.5)
Glucose: 97 mg/dL (ref 70–99)
Potassium: 4.6 mmol/L (ref 3.5–5.2)
Sodium: 138 mmol/L (ref 134–144)
Total Protein: 6.5 g/dL (ref 6.0–8.5)
eGFR: 89 mL/min/{1.73_m2} (ref >59)

## 2023-03-22 LAB — HEMOGLOBIN A1C
Est. average glucose Bld gHb Est-mCnc: 120 mg/dL
Hgb A1c MFr Bld: 5.8 % — ABNORMAL HIGH (ref 4.8–5.6)

## 2023-03-22 LAB — LIPID PANEL
Chol/HDL Ratio: 3.3 ratio (ref 0.0–4.4)
Cholesterol, Total: 213 mg/dL — ABNORMAL HIGH (ref 100–199)
HDL: 64 mg/dL (ref >39)
LDL Chol Calc (NIH): 139 mg/dL — ABNORMAL HIGH (ref 0–99)
Triglycerides: 59 mg/dL (ref 0–149)
VLDL Cholesterol Cal: 10 mg/dL (ref 5–40)

## 2023-03-22 LAB — C-REACTIVE PROTEIN: CRP: 16 mg/L — ABNORMAL HIGH (ref 0–10)

## 2023-03-27 ENCOUNTER — Encounter: Payer: Self-pay | Admitting: Nurse Practitioner

## 2023-03-27 DIAGNOSIS — F3131 Bipolar disorder, current episode depressed, mild: Secondary | ICD-10-CM | POA: Diagnosis not present

## 2023-04-04 DIAGNOSIS — M7062 Trochanteric bursitis, left hip: Secondary | ICD-10-CM | POA: Diagnosis not present

## 2023-04-04 DIAGNOSIS — M797 Fibromyalgia: Secondary | ICD-10-CM | POA: Diagnosis not present

## 2023-04-04 DIAGNOSIS — L508 Other urticaria: Secondary | ICD-10-CM | POA: Diagnosis not present

## 2023-04-04 DIAGNOSIS — M7061 Trochanteric bursitis, right hip: Secondary | ICD-10-CM | POA: Diagnosis not present

## 2023-04-05 DIAGNOSIS — F3131 Bipolar disorder, current episode depressed, mild: Secondary | ICD-10-CM | POA: Diagnosis not present

## 2023-04-14 ENCOUNTER — Other Ambulatory Visit: Payer: Self-pay | Admitting: Nurse Practitioner

## 2023-04-14 DIAGNOSIS — T783XXS Angioneurotic edema, sequela: Secondary | ICD-10-CM

## 2023-04-14 DIAGNOSIS — L659 Nonscarring hair loss, unspecified: Secondary | ICD-10-CM

## 2023-04-14 DIAGNOSIS — R229 Localized swelling, mass and lump, unspecified: Secondary | ICD-10-CM

## 2023-04-14 DIAGNOSIS — R519 Headache, unspecified: Secondary | ICD-10-CM

## 2023-04-14 DIAGNOSIS — R7303 Prediabetes: Secondary | ICD-10-CM

## 2023-04-14 DIAGNOSIS — R234 Changes in skin texture: Secondary | ICD-10-CM

## 2023-04-14 DIAGNOSIS — E559 Vitamin D deficiency, unspecified: Secondary | ICD-10-CM

## 2023-04-14 DIAGNOSIS — R202 Paresthesia of skin: Secondary | ICD-10-CM

## 2023-04-14 DIAGNOSIS — M2550 Pain in unspecified joint: Secondary | ICD-10-CM

## 2023-04-14 DIAGNOSIS — E782 Mixed hyperlipidemia: Secondary | ICD-10-CM

## 2023-04-22 DIAGNOSIS — K080 Exfoliation of teeth due to systemic causes: Secondary | ICD-10-CM | POA: Diagnosis not present

## 2023-05-01 DIAGNOSIS — K080 Exfoliation of teeth due to systemic causes: Secondary | ICD-10-CM | POA: Diagnosis not present

## 2023-05-03 DIAGNOSIS — F3131 Bipolar disorder, current episode depressed, mild: Secondary | ICD-10-CM | POA: Diagnosis not present

## 2023-05-09 ENCOUNTER — Encounter: Payer: Self-pay | Admitting: Family Medicine

## 2023-05-09 ENCOUNTER — Other Ambulatory Visit (HOSPITAL_COMMUNITY)
Admission: RE | Admit: 2023-05-09 | Discharge: 2023-05-09 | Disposition: A | Discharge status: Home / Self Care | Payer: Medicare Other | Source: Ambulatory Visit | Attending: Family Medicine | Admitting: Family Medicine

## 2023-05-09 ENCOUNTER — Ambulatory Visit: Payer: Medicare Other | Admitting: Family Medicine

## 2023-05-09 VITALS — BP 110/62 | HR 81 | Ht 61.0 in | Wt 160.0 lb

## 2023-05-09 DIAGNOSIS — Z01419 Encounter for gynecological examination (general) (routine) without abnormal findings: Secondary | ICD-10-CM | POA: Diagnosis not present

## 2023-05-09 DIAGNOSIS — R102 Pelvic and perineal pain: Secondary | ICD-10-CM | POA: Insufficient documentation

## 2023-05-09 DIAGNOSIS — Z1339 Encounter for screening examination for other mental health and behavioral disorders: Secondary | ICD-10-CM

## 2023-05-09 DIAGNOSIS — Z1231 Encounter for screening mammogram for malignant neoplasm of breast: Secondary | ICD-10-CM

## 2023-05-09 DIAGNOSIS — N941 Unspecified dyspareunia: Secondary | ICD-10-CM | POA: Diagnosis not present

## 2023-05-09 DIAGNOSIS — Z1151 Encounter for screening for human papillomavirus (HPV): Secondary | ICD-10-CM | POA: Insufficient documentation

## 2023-05-10 LAB — CERVICOVAGINAL ANCILLARY ONLY
Bacterial Vaginitis (gardnerella): NEGATIVE
Candida Glabrata: NEGATIVE
Candida Vaginitis: NEGATIVE
Chlamydia: NEGATIVE
Comment: NEGATIVE
Comment: NEGATIVE
Comment: NEGATIVE
Comment: NEGATIVE
Comment: NEGATIVE
Comment: NORMAL
Neisseria Gonorrhea: NEGATIVE
Trichomonas: NEGATIVE

## 2023-05-13 LAB — CYTOLOGY - PAP
Adequacy: ABSENT
Comment: NEGATIVE
Diagnosis: NEGATIVE
High risk HPV: NEGATIVE

## 2023-05-14 NOTE — Progress Notes (Signed)
ANNUAL EXAM Patient name: Tiffany Juarez MRN [id number]  Date of birth: [birth year] Chief Complaint:   Annual Exam  History of Present Illness:   Tiffany Juarez is a 42 y.o.  [phone]  female  being seen today for a routine annual exam.  Current complaints: having vaginal pain and dyspareunia. Dyspareunia is through entirety of penetrative. Recently diagnosed with fibromyalgia. Is on cymbalta and robaxin for that. Not helping vaginal pain.  Patient's last menstrual period was 04/27/2023 (exact date).    Last pap 04/2020. Results were: NILM w/ HRHPV negative. H/O abnormal pap: no Last mammogram: never done.   Last colonoscopy: n/a.     05/09/2023   10:18 AM 03/21/2023    9:12 AM 11/20/2022   10:31 AM 11/27/2021   10:02 AM 11/10/2021   10:02 AM  Depression screen PHQ 2/9  Decreased Interest 0 0 1 3 3   Down, Depressed, Hopeless 0 0 3 3 3   PHQ - 2 Score 0 0 4 6 6   Altered sleeping 2  2 3 3   Tired, decreased energy 2  2 3 3   Change in appetite 2  3 1 3   Feeling bad or failure about yourself  0  0 1 1  Trouble concentrating 2  0 1 0  Moving slowly or fidgety/restless 0  0 1 0  Suicidal thoughts 0  0 2 0  PHQ-9 Score 8  11 18 16   Difficult doing work/chores   Somewhat difficult Very difficult Somewhat difficult        05/09/2023   10:18 AM 11/13/2017   11:04 AM 06/17/2017   10:08 AM 04/22/2017    9:36 AM  GAD 7 : Generalized Anxiety Score  Nervous, Anxious, on Edge 3 0 3 2  Control/stop worrying 1 0 3 0  Worry too much - different things 2 0 3 1  Trouble relaxing 2 1 3 3   Restless 3 1 1 3   Easily annoyed or irritable 3 1 3 1   Afraid - awful might happen 0 0 3 1  Total GAD 7 Score 14 3 19 11      Review of Systems:   Pertinent items are noted in HPI Denies any headaches, blurred vision, fatigue, shortness of breath, chest pain, abdominal pain, abnormal vaginal discharge/itching/odor/irritation, problems with periods, bowel movements, urination, or intercourse unless otherwise  stated above. Pertinent History Reviewed:  Reviewed past medical,surgical, social and family history.  Reviewed problem list, medications and allergies. Physical Assessment:   Vitals:   05/09/23 1012  BP: 110/62  Pulse: 81  Weight: 160 lb (72.6 kg)  Height: 5\' 1"  (1.549 m)  Body mass index is 30.23 kg/m.        Physical Examination:   General appearance - well appearing, and in no distress  Mental status - alert, oriented to person, place, and time  Psych:  She has a normal mood and affect  Skin - warm and dry, normal color, no suspicious lesions noted  Chest - effort normal, all lung fields clear to auscultation bilaterally  Heart - normal rate and regular rhythm  Neck:  midline trachea, no thyromegaly or nodules  Breasts - breasts appear normal, no suspicious masses, no skin or nipple changes or axillary nodes  Abdomen - soft, nontender, nondistended, no masses or organomegaly  Pelvic - VULVA: normal appearing vulva with no masses, tenderness or lesions  VAGINA: normal appearing vagina with normal color and discharge, no lesions  CERVIX: normal appearing cervix without discharge or lesions, no CMT  Thin prep pap is done with HR HPV cotesting  UTERUS: uterus is felt to be normal size, shape, consistency and nontender   ADNEXA: No adnexal masses or tenderness noted.  Extremities:  No swelling or varicosities noted  Chaperone present for exam  Assessment & Plan:  1. Well woman exam with routine gynecological exam - Cytology - PAP( [place])  2. Encounter for screening mammogram for malignant neoplasm of breast - MM 3D SCREENING MAMMOGRAM BILATERAL BREAST; Future  3. Pelvic pain Vaginal cultures obtained. Will refer to pelvic floor PT. Will also schedule appt with Dr Briscoe Deutscher.  - Cervicovaginal ancillary only( [place])   Labs/procedures today:     Orders Placed This Encounter  Procedures   MM 3D SCREENING MAMMOGRAM BILATERAL BREAST    Meds: No orders of the  defined types were placed in this encounter.   Follow-up: No follow-ups on file.  Levie Heritage, DO 05/14/2023 1:47 PM

## 2023-05-28 DIAGNOSIS — L508 Other urticaria: Secondary | ICD-10-CM | POA: Diagnosis not present

## 2023-05-28 DIAGNOSIS — M797 Fibromyalgia: Secondary | ICD-10-CM | POA: Diagnosis not present

## 2023-05-28 DIAGNOSIS — R5382 Chronic fatigue, unspecified: Secondary | ICD-10-CM | POA: Diagnosis not present

## 2023-06-07 DIAGNOSIS — F3131 Bipolar disorder, current episode depressed, mild: Secondary | ICD-10-CM | POA: Diagnosis not present

## 2023-06-10 ENCOUNTER — Inpatient Hospital Stay (HOSPITAL_BASED_OUTPATIENT_CLINIC_OR_DEPARTMENT_OTHER): Admission: RE | Admit: 2023-06-10 | Payer: Medicare Other | Source: Ambulatory Visit

## 2023-06-17 ENCOUNTER — Ambulatory Visit (HOSPITAL_BASED_OUTPATIENT_CLINIC_OR_DEPARTMENT_OTHER)
Admission: RE | Admit: 2023-06-17 | Discharge: 2023-06-17 | Disposition: A | Discharge status: Home / Self Care | Payer: Medicare Other | Source: Ambulatory Visit | Attending: Family Medicine | Admitting: Family Medicine

## 2023-06-17 ENCOUNTER — Encounter (HOSPITAL_BASED_OUTPATIENT_CLINIC_OR_DEPARTMENT_OTHER): Payer: Self-pay

## 2023-06-17 DIAGNOSIS — Z1231 Encounter for screening mammogram for malignant neoplasm of breast: Secondary | ICD-10-CM | POA: Insufficient documentation

## 2023-06-25 DIAGNOSIS — F3131 Bipolar disorder, current episode depressed, mild: Secondary | ICD-10-CM | POA: Diagnosis not present

## 2023-07-08 DIAGNOSIS — F3131 Bipolar disorder, current episode depressed, mild: Secondary | ICD-10-CM | POA: Diagnosis not present

## 2023-08-15 ENCOUNTER — Encounter: Payer: Medicare Other | Admitting: Physical Therapy

## 2023-08-22 ENCOUNTER — Encounter: Payer: Medicare Other | Admitting: Physical Therapy

## 2023-08-22 DIAGNOSIS — F3131 Bipolar disorder, current episode depressed, mild: Secondary | ICD-10-CM | POA: Diagnosis not present

## 2023-08-28 ENCOUNTER — Ambulatory Visit (INDEPENDENT_AMBULATORY_CARE_PROVIDER_SITE_OTHER): Payer: Medicare Other | Admitting: Medical

## 2023-08-28 VITALS — BP 110/70 | HR 74 | Temp 97.3°F | Resp 97 | Wt 163.8 lb

## 2023-08-28 DIAGNOSIS — G8929 Other chronic pain: Secondary | ICD-10-CM

## 2023-08-28 DIAGNOSIS — R200 Anesthesia of skin: Secondary | ICD-10-CM

## 2023-08-28 DIAGNOSIS — M7552 Bursitis of left shoulder: Secondary | ICD-10-CM

## 2023-08-28 DIAGNOSIS — G500 Trigeminal neuralgia: Secondary | ICD-10-CM

## 2023-08-28 DIAGNOSIS — M25512 Pain in left shoulder: Secondary | ICD-10-CM | POA: Diagnosis not present

## 2023-08-28 MED ORDER — BACLOFEN 10 MG PO TABS: 10.0000 mg | ORAL_TABLET | Freq: Two times a day (BID) | ORAL | 0 refills | Status: DC

## 2023-08-28 MED ORDER — PREDNISONE 10 MG PO TABS: ORAL_TABLET | ORAL | 0 refills | Status: DC

## 2023-08-28 NOTE — Progress Notes (Signed)
Pt notified.  No questions.

## 2023-08-28 NOTE — Progress Notes (Signed)
 Subjective:  Tiffany Juarez is a 43 y.o. female who presents for Chief Complaint  Patient presents with   shooting pains    Shooting pains in headaches that will last for days. Left shoulder pain     Here today with her toddler.  She stays at home, not currently working outside the home  She has 3 main complaints  She notes left shoulder pain for about a month and a half.  She is right-handed.  No fall injury or trauma.  She has pain lifting up over her head, and doing anything with the arm continues to hurt lately.  No swelling.  No neck pain particularly  For the past several months she gets shooting pains in her face on the left.  Pains typically around the ear, temple and forehead on the left and sometimes shoot posteriorly and sometimes across the face.  There are stabbing lightening bolt type pains very painful brief but somewhat frequent  No numbness or tingling other than carpal tunnel's concerns, no slurred speech confusion or overt weakness, no headache with physical activity, no visual change or hearing change.  She has a history of fibromyalgia.    She is using a lot of ibuprofen  without improvement so far   No other aggravating or relieving factors.    No other c/o.  Past Medical History:  Diagnosis Date   Alteration consciousness 05/26/2018   Anginal pain (HCC)    Angio-edema 12/03/2022   Bipolar disorder (HCC)    Chronic intractable headache 12/03/2022   Class 1 obesity with serious comorbidity and body mass index (BMI) of 31.0 to 31.9 in adult 11/27/2021   Depression    Dry skin 11/17/2019   Fibroid    Fibromyalgia    Finger numbness 02/15/2021   Multiple skin nodules 12/03/2022   Night sweats 11/17/2019   Numbness and tingling    Obesity (BMI 30-39.9) 11/17/2019   Pain in both wrists 02/15/2021   Paresthesia 10/09/2017   Peeling skin 12/03/2022   Prediabetes 11/06/2018   Pure hypercholesterolemia 11/17/2019   Smoker 11/17/2019   Current Outpatient  Medications on File Prior to Visit  Medication Sig Dispense Refill   busPIRone (BUSPAR) 15 MG tablet Take 15 mg by mouth 3 (three) times daily.     DULoxetine  (CYMBALTA ) 30 MG capsule Take 1 capsule (30 mg total) by mouth daily. 30 capsule 12   lurasidone (LATUDA) 20 MG TABS tablet Take by mouth.     methocarbamol  (ROBAXIN -750) 750 MG tablet Take 1 tablet (750 mg total) by mouth every 6 (six) hours as needed (muscle pain). 90 tablet 2   Vitamin D , Ergocalciferol , (DRISDOL ) 1.25 MG (50000 UNIT) CAPS capsule Take 1 capsule (50,000 Units total) by mouth every 7 (seven) days. 12 capsule 3   No current facility-administered medications on file prior to visit.     The following portions of the patient's history were reviewed and updated as appropriate: allergies, current medications, past family history, past medical history, past social history, past surgical history and problem list.  ROS Otherwise as in subjective above  Objective: BP 110/70   Pulse 74   Temp (!) 97.3 F (36.3 C)   Resp (!) 97   Wt 163 lb 12.8 oz (74.3 kg)   BMI 30.95 kg/m   General appearance: alert, no distress, well developed, well nourished HEENT: normocephalic, sclerae anicteric, conjunctiva pink and moist, TMs pearly, nares patent, no discharge or erythema, pharynx normal Oral cavity: MMM, no lesions Neck: supple, no lymphadenopathy,  no thyromegaly, no masses, nontender, supple, normal range of motion MSK: Arms and shoulder nontender to palpation, no deformity or swelling, but pain with left shoulder flexion or abduction over 80 degrees, decreased external range of motion of left shoulder, pain with crossover, apprehension test and shoulder range of motion in general with the left shoulder but worse over 80 degrees of flexion or abduction, otherwise unremarkable MSK exam Grip strength seems normal, arm strength normal in general Pulses: 2+ radial pulses, 2+ pedal pulses, normal cap refill Ext: no edema Otherwise  arms and legs neurovascularly intact    Assessment: Encounter Diagnoses  Name Primary?   Trigeminal neuralgia of left side of face Yes   Chronic left shoulder pain    Bursitis of left shoulder    Hand numbness      Plan: We discussed her head pain sharp-like pains that are most suggestive of trigeminal neuralgia.  We discussed the diagnosis, possible treatment.  We would do an MRI for further evaluation.  Begin trial of baclofen  twice a day.  Given some of her medicines including Latuda, carbamazepine  contraindicated.  Consider gabapentin  as another agent  Chronic left shoulder pain, likely some bursitis but cannot rule out other abnormality-referral to orthopedics for further evaluation and possible x-ray.  In the meantime begin short dose of prednisone  and stop over-the-counter NSAIDs for now, use arm sling periodically to help rest the arm for support, can do cold therapy, no heavy lifting in the short-term  Hand numbness, possible carpal tunnel syndrome-referral to orthopedics  Tiffanni was seen today for shooting pains.  Diagnoses and all orders for this visit:  Trigeminal neuralgia of left side of face -     MR BRAIN WO CONTRAST; Future  Chronic left shoulder pain -     AMB referral to orthopedics  Bursitis of left shoulder -     AMB referral to orthopedics  Hand numbness -     AMB referral to orthopedics  Other orders -     predniSONE  (DELTASONE ) 10 MG tablet; 6 tablets all together day 1, 5 tablets day 2, 4 tablets day 3, 3 tablets day 4, 2 tablets day 5, 1 tablet day 6. -     baclofen  (LIORESAL ) 10 MG tablet; Take 1 tablet (10 mg total) by mouth 2 (two) times daily.    Follow up: Pending referral and MRI

## 2023-08-28 NOTE — Patient Instructions (Signed)
 Trigeminal Neuralgia  Trigeminal neuralgia is a nerve disorder that causes severe pain on one side of the face. The pain may last from a few seconds to several minutes, but it can happen hundreds of times a day. The pain is usually only on one side of the face. Symptoms may occur for days, weeks, or months and then go away for months or years. The pain may return and be worse than before. What are the causes? This condition may be caused by: Damage or pressure to a nerve in the head that is called the trigeminal nerve. An attack can be triggered by: Talking or chewing. Putting on makeup. Washing, shaving, or touching your face. Brushing your teeth. Blasts of hot or cold air. Primary demyelinating disorders, such as multiple sclerosis. Tumors. What increases the risk? You are more likely to develop this condition if: You are 31-10 years old. You are female. What are the signs or symptoms? The main symptom of this condition is severe pain in the jaw, lips, eyes, nose, scalp, forehead, and face. How is this diagnosed? This condition is diagnosed with a physical exam. A CT scan or an MRI may be done to rule out other conditions that can cause facial pain. How is this treated? This condition may be treated with: Measures to avoid the things that trigger your symptoms. Prescription medicines such as anticonvulsants. Procedures such as ablation, thermal, or radiation therapy. Cognitive or behavioral therapy. Complementary therapies such as: Gentle, regular exercise or yoga. Meditation. Aromatherapy. Acupuncture. Surgery. This may be done in severe cases if other medical treatment does not provide relief. It may take up to one month for treatment to start relieving the pain. Follow these instructions at home: Managing pain  Learn as much as you can about how to manage your pain. Ask your health care provider if a pain specialist would be helpful. Consider talking with a mental health  care provider about how to cope with the pain. Consider joining a pain support group. General instructions Take over-the-counter and prescription medicines only as told by your health care provider. Avoid the things that trigger your symptoms. It may help to: Chew on the unaffected side of your mouth. Avoid touching your face. Avoid blasts of hot or cold air. Keep all follow-up visits. Where to find more information Facial Pain Association: facepain.org Contact a health care provider if: Your medicine is not helping your symptoms. You have side effects from the medicine used for treatment. You develop new, unexplained symptoms, such as: Double vision. Facial weakness or numbness. Changes in hearing or balance. You feel depressed. Get help right away if: Your pain is severe and is not getting better. You develop suicidal thoughts. If you ever feel like you may hurt yourself or others, or have thoughts about taking your own life, get help right away. Go to your nearest emergency department or: Call your local emergency services (911 in the [place] Call a suicide crisis helpline, such as the National Suicide Prevention Lifeline at 857-268-9197 or 988 in the [place] This is open 24 hours a day in the [place] If you're a Veteran: Call 988 and press 1. This is open 24 hours a day. Text the [employer] at 587-680-7818. Summary Trigeminal neuralgia is a nerve disorder that causes severe pain on one side of the face. The pain may last from a few seconds to several minutes. This condition is caused by damage or pressure to a nerve in the head that is called  the trigeminal nerve. Treatment may include avoiding the things that trigger your symptoms, taking medicines, or having procedures or surgery. It may take up to one month for treatment to start relieving the pain. Keep all follow-up visits. This information is not intended to replace advice given to you by your health care provider. Make sure  you discuss any questions you have with your health care provider. Document Revised: 02/21/2023 Document Reviewed: 01/02/2021 Elsevier Patient Education  2024 [employer].

## 2023-08-29 ENCOUNTER — Encounter: Payer: Medicare Other | Admitting: Physical Therapy

## 2023-09-05 ENCOUNTER — Encounter: Payer: Medicare Other | Admitting: Physical Therapy

## 2023-09-15 ENCOUNTER — Ambulatory Visit
Admission: RE | Admit: 2023-09-15 | Discharge: 2023-09-15 | Disposition: A | Discharge status: Home / Self Care | Payer: Medicare Other | Source: Ambulatory Visit | Attending: Medical | Admitting: Medical

## 2023-09-15 DIAGNOSIS — G500 Trigeminal neuralgia: Secondary | ICD-10-CM

## 2023-09-20 ENCOUNTER — Ambulatory Visit (INDEPENDENT_AMBULATORY_CARE_PROVIDER_SITE_OTHER): Payer: Medicare Other | Admitting: Orthopedic Surgery

## 2023-09-20 ENCOUNTER — Other Ambulatory Visit (INDEPENDENT_AMBULATORY_CARE_PROVIDER_SITE_OTHER): Payer: Self-pay

## 2023-09-20 DIAGNOSIS — M25512 Pain in left shoulder: Secondary | ICD-10-CM

## 2023-09-20 DIAGNOSIS — M792 Neuralgia and neuritis, unspecified: Secondary | ICD-10-CM | POA: Diagnosis not present

## 2023-09-20 DIAGNOSIS — M7532 Calcific tendinitis of left shoulder: Secondary | ICD-10-CM

## 2023-09-20 DIAGNOSIS — R200 Anesthesia of skin: Secondary | ICD-10-CM | POA: Diagnosis not present

## 2023-09-21 ENCOUNTER — Encounter: Payer: Self-pay | Admitting: Orthopedic Surgery

## 2023-09-21 MED ORDER — BUPIVACAINE HCL 0.5 % IJ SOLN
9.0000 mL | INTRAMUSCULAR | Status: AC | PRN
Start: 2023-09-20 — End: 2023-09-20
  Administered 2023-09-20: 9 mL via INTRA_ARTICULAR

## 2023-09-21 MED ORDER — LIDOCAINE HCL 1 % IJ SOLN
5.0000 mL | INTRAMUSCULAR | Status: AC | PRN
  Administered 2023-09-20: 5 mL

## 2023-09-21 MED ORDER — METHYLPREDNISOLONE ACETATE 40 MG/ML IJ SUSP
40.0000 mg | INTRAMUSCULAR | Status: AC | PRN
  Administered 2023-09-20: 40 mg via INTRA_ARTICULAR

## 2023-09-21 NOTE — Progress Notes (Signed)
 Office Visit Note   Patient: Tiffany Juarez           Date of Birth: Jun 01, 1981           MRN: [id number] Visit Date: 09/20/2023 Requested by: Jac Canavan, PA-C [address] [place],  [place] [address] PCP: Tollie Eth, NP  Subjective: Chief Complaint  Patient presents with   Neck - Pain    Bilateral arm/hand numbness    HPI: Tiffany Juarez is a 43 y.o. female who presents to the office reporting multiple orthopedic complaints.  She describes bilateral shoulder pain as well as bilateral hand numbness.  Left shoulder worse than the right.  She is right-hand dominant.  Patient states "I have carpal tunnel syndrome".  Did have nerve study over 2 years ago where she was told she had carpal tunnel syndrome.  This was done at another office.  She has concerns about surgery because she does have a child who requires a higher level of care.  Pain does wake her from sleep at night primarily in the hands.  She has been dropping things.  Does describe numbness and tingling digits 1 through 3 occasionally in digit 4 in both hands.  Reports decreased strength in grip.  Also describes some occasional scapular pain and neck pain.  Patient also describes recent onset of left-sided shoulder pain of 3 weeks duration.  Has a lot of pain with abduction.  Denies much in the way of coarse grinding or crepitus.  She has previously tried a Medrol Dosepak which only helped a week but then she broke out in hives.  Does have a history of fibromyalgia..                ROS: All systems reviewed are negative as they relate to the chief complaint within the history of present illness.  Patient denies fevers or chills.  Assessment & Plan: Visit Diagnoses:  1. Bilateral hand numbness   2. Radicular pain in left arm   3. Left shoulder pain, unspecified chronicity     Plan: Impression is bilateral shoulder pain left worse than right with calcific t sequentially in the wrist at that time.  Endinitis I think would also  be good to get another baseline EMG nerve study to rule out carpal tunnel in both wrists.  Clinically she has carpal tunnel either moderate or severe based on daily symptoms as well as loss of dexterity.  Likely heading for surgical intervention.  Subacromial injection performed as well today.  Follow-up after the nerve study and we can plan for likely surgical intervention visible on plain radiographs.  This does match her relatively quick onset of symptoms.  Subacromial injection indicated for pain relief.  Minutes before  Follow-Up Instructions: No follow-ups on file.   Orders:  Orders Placed This Encounter  Procedures   XR Cervical Spine 2 or 3 views   XR Shoulder Left   Ambulatory referral to Physical Medicine Rehab   No orders of the defined types were placed in this encounter.     Procedures: Large Joint Inj: L subacromial bursa on 09/20/2023 2:00 PM Indications: diagnostic evaluation and pain Details: 18 G 1.5 in needle, posterior approach  Arthrogram: No  Medications: 9 mL bupivacaine 0.5 %; 40 mg methylPREDNISolone acetate 40 MG/ML; 5 mL lidocaine 1 % Outcome: tolerated well, no immediate complications Procedure, treatment alternatives, risks and benefits explained, specific risks discussed. Consent was given by the patient. Immediately prior to procedure a time out was  called to verify the correct patient, procedure, equipment, support staff and site/side marked as required. Patient was prepped and draped in the usual sterile fashion.       Clinical Data: No additional findings.  Objective: Vital Signs: There were no vitals taken for this visit.  Physical Exam:  Constitutional: Patient appears well-developed HEENT:  Head: Normocephalic Eyes:EOM are normal Neck: Normal range of motion Cardiovascular: Normal rate Pulmonary/chest: Effort normal Neurologic: Patient is alert Skin: Skin is warm Psychiatric: Patient has normal mood and affect  Ortho Exam: Ortho exam  demonstrates positive carpal tunnel compression testing bilaterally.  Abductor pollicis brevis strength is intact bilaterally along with EPL FPL and interosseous strength.  Radial pulses intact.  Negative Tinel's cubital tunnel bilaterally with no instability of the ulnar nerve.  No other masses lymphadenopathy or skin changes noted in that hand region.  Both shoulders are examined.  She has excellent rotator cuff strength demonstrate supraspinatus and subscap muscle testing but does have some pain with abduction on the left which is not present on the right.  Passive range of motion bilaterally is 65/90/160.  Painful passive abduction present on the left.  Patient has bilateral 5 out of 5 grip EPL FPL interosseous wrist flexion wrist extension bicep triceps and deltoid strength.  Bilateral palpable radial pulses and no paresthesias C5-T1 in either arm.  Neck range of motion flexion chin to chest with extension approximately 50 degrees with approximately 50 degrees of rotation bilaterally.  No masses lymphadenopathy or skin changes around the neck or shoulder girdle region bilaterally   Specialty Comments:  No specialty comments available.  Imaging: No results found.   PMFS History: Patient Active Problem List   Diagnosis Date Noted   Hives of unknown origin 03/21/2023   Encounter for annual physical exam 03/21/2023   Painful swelling of joint 12/03/2022   Hair loss 12/03/2022   Mixed hyperlipidemia 11/27/2021   Bilateral carpal tunnel syndrome 02/15/2021   Fibroid 09/12/2020   Vitamin D deficiency 11/11/2018   Prediabetes 11/06/2018   Other chronic pain 05/26/2018   Sleep walking 05/15/2018   Bipolar disorder (HCC)    Pain of both hip joints 10/09/2017   Past Medical History:  Diagnosis Date   Alteration consciousness 05/26/2018   Anginal pain (HCC)    Angio-edema 12/03/2022   Bipolar disorder (HCC)    Chronic intractable headache 12/03/2022   Class 1 obesity with serious  comorbidity and body mass index (BMI) of 31.0 to 31.9 in adult 11/27/2021   Depression    Dry skin 11/17/2019   Fibroid    Fibromyalgia    Finger numbness 02/15/2021   Multiple skin nodules 12/03/2022   Night sweats 11/17/2019   Numbness and tingling    Obesity (BMI 30-39.9) 11/17/2019   Pain in both wrists 02/15/2021   Paresthesia 10/09/2017   Peeling skin 12/03/2022   Prediabetes 11/06/2018   Pure hypercholesterolemia 11/17/2019   Smoker 11/17/2019    Family History  Problem Relation Age of Onset   Depression Father    Schizophrenia Father    Depression Brother    Healthy Mother    Hypertension Maternal Grandmother    Cancer Maternal Grandmother    Diabetes Maternal Aunt     Past Surgical History:  Procedure Laterality Date   CESAREAN SECTION     CESAREAN SECTION N/A 01/19/2021   Procedure: CESAREAN SECTION;  Surgeon: Myna Hidalgo, DO;  Location: [facility] ORS;  Service: Obstetrics;  Laterality: N/A;   wisdom teeth  Social History   Occupational History   Occupation: Unemployed  Tobacco Use   Smoking status: Some Days    Current packs/day: 0.50    Types: Cigarettes   Smokeless tobacco: Never  Vaping Use   Vaping status: Some Days   Substances: Nicotine, CBD, Flavoring  Substance and Sexual Activity   Alcohol use: No    Comment: quit 2014   Drug use: No   Sexual activity: Yes    Birth control/protection: None

## 2023-09-26 NOTE — Progress Notes (Signed)
 Fortunately MRI shows no worrisome findings.    Get her back in to discuss the Tradjenta neuralgia pain we saw her for recently  Morrie Sheldon please schedule

## 2023-10-02 ENCOUNTER — Ambulatory Visit: Admitting: Medical

## 2023-10-02 VITALS — BP 108/68 | HR 82 | Temp 97.6°F | Wt 164.6 lb

## 2023-10-02 DIAGNOSIS — G894 Chronic pain syndrome: Secondary | ICD-10-CM

## 2023-10-02 DIAGNOSIS — G500 Trigeminal neuralgia: Secondary | ICD-10-CM

## 2023-10-02 DIAGNOSIS — M791 Myalgia, unspecified site: Secondary | ICD-10-CM

## 2023-10-02 MED ORDER — GABAPENTIN 100 MG PO CAPS: 100.0000 mg | ORAL_CAPSULE | Freq: Two times a day (BID) | ORAL | 1 refills | Status: DC

## 2023-10-02 NOTE — Patient Instructions (Addendum)
 Trigeminal neuralgia pain You did not seem to get benefit with baclofen Your MRI brain did not show anything worrisome thankfully Begin trial of gabapentin 100 mg twice daily to help with the trigeminal neuralgia pain Reduce stress where possible For the next week I would you send me your muscle relaxer Robaxin to see if that helps and your muscle aches or pains.  Maybe try this in the evening or afternoon but do not take it at the same time as the gabapentin or within 3 hours of gabapentin Consider massage therapy once every couple weeks Consider water therapy such as lap swimming or water aerobics at the [facility] I am going to make a referral to neurology regarding this issue Let me know within 2 weeks if you are seeing benefit with the 100 mg twice daily gabapentin Follow-up orthopedics regarding your other joint issues and concerns

## 2023-10-02 NOTE — Progress Notes (Signed)
 Subjective:  Tiffany Juarez is a 43 y.o. female who presents for Chief Complaint  Patient presents with   discuss pain    Discuss Tradjenta Pain. Pain in feet, hands, hives over body in random spot, mouth is swollen     Here for follow-up.  I saw her back in February for vaginal neuralgia type pain.  She did go and have her MRI.  She notes that the pain is actually worse and not really improving at all.  She still gets pains for months now shooting pains in the face.  Last visit she mentioned more left-sided pain and this time more right-sided pain.  The pain can be random and can shoot across her face stabbing type pains.  Stress seems to aggravate the pain in the morning.  She also has generalized pains in her body overall, arms and legs and back and neck.  Since last visit she did see orthopedics regarding her shoulder and carpal tunnel concerns.  She has follow-up with them soon.  She sees psychiatry who manages her psychotropic medications.  She notes that the baclofen from last visit did not really seem to help her pain   She has been on gabapentin in past and it didn't seem to help pain  She has a history of fibromyalgia.    No other aggravating or relieving factors.    No other c/o.  Past Medical History:  Diagnosis Date   Alteration consciousness 05/26/2018   Anginal pain (HCC)    Angio-edema 12/03/2022   Bipolar disorder (HCC)    Chronic intractable headache 12/03/2022   Class 1 obesity with serious comorbidity and body mass index (BMI) of 31.0 to 31.9 in adult 11/27/2021   Depression    Dry skin 11/17/2019   Fibroid    Fibromyalgia    Finger numbness 02/15/2021   Multiple skin nodules 12/03/2022   Night sweats 11/17/2019   Numbness and tingling    Obesity (BMI 30-39.9) 11/17/2019   Pain in both wrists 02/15/2021   Paresthesia 10/09/2017   Peeling skin 12/03/2022   Prediabetes 11/06/2018   Pure hypercholesterolemia 11/17/2019   Smoker 11/17/2019   Current  Outpatient Medications on File Prior to Visit  Medication Sig Dispense Refill   busPIRone (BUSPAR) 15 MG tablet Take 15 mg by mouth 3 (three) times daily.     DULoxetine (CYMBALTA) 30 MG capsule Take 1 capsule (30 mg total) by mouth daily. 30 capsule 12   lurasidone (LATUDA) 20 MG TABS tablet Take by mouth.     methocarbamol (ROBAXIN-750) 750 MG tablet Take 1 tablet (750 mg total) by mouth every 6 (six) hours as needed (muscle pain). 90 tablet 2   Vitamin D, Ergocalciferol, (DRISDOL) 1.25 MG (50000 UNIT) CAPS capsule Take 1 capsule (50,000 Units total) by mouth every 7 (seven) days. 12 capsule 3   No current facility-administered medications on file prior to visit.     The following portions of the patient's history were reviewed and updated as appropriate: allergies, current medications, past family history, past medical history, past social history, past surgical history and problem list.  ROS Otherwise as in subjective above  Objective: BP 108/68   Pulse 82   Temp 97.6 F (36.4 C)   Wt 164 lb 9.6 oz (74.7 kg)   BMI 31.10 kg/m   General appearance: alert, no distress, well developed, well nourished HEENT: normocephalic, sclerae anicteric, conjunctiva pink and moist, TMs pearly, nares patent, no discharge or erythema, pharynx normal Oral cavity: MMM, no  lesions Neck: supple, no lymphadenopathy, no thyromegaly, no masses, mild tendnerss over posterior c7, otherwise nontender, supple, normal range of motion Back: Mild tenderness of supraspinatus bilaterally, but otherwise back nontender but she seems to be in pain with palpation in general MSK: Arms without obvious tenderness, no deformity or swelling, but she seems to be in pain moving her arms in general Grip strength seems normal, arm strength normal in general Pulses: 2+ radial pulses, 2+ pedal pulses, normal cap refill Ext: no edema Otherwise arms and legs neurovascularly intact    Assessment: Encounter Diagnoses  Name  Primary?   Trigeminal neuralgia Yes   Myalgia    Chronic pain syndrome       Plan: I reviewed her brain MRI from 09/15/2023 that is normal  She still has symptoms of trigeminal neuralgia but she also has other chronic pain symptoms and history of fibromyalgia.  She sees psychiatry and is already on some medications that typically help with pain, but does not seem to be helping her current pain  She continues on Latuda BuSpar and Cymbalta in general  Last visit baclofen did not seem to help the trigeminal symptoms.  She does have Robaxin muscle relaxer she has on hand for as needed use.  Has not used it lately  Trigeminal neuralgia pain You did not seem to get benefit with baclofen Your MRI brain did not show anything worrisome thankfully Begin trial of gabapentin 100 mg twice daily to help with the trigeminal neuralgia pain Reduce stress where possible For the next week I would you send me your muscle relaxer Robaxin to see if that helps and your muscle aches or pains.  Maybe try this in the evening or afternoon but do not take it at the same time as the gabapentin or within 3 hours of gabapentin Consider massage therapy once every couple weeks Consider water therapy such as lap swimming or water aerobics at the [facility] I am going to make a referral to neurology regarding this issue Let me know within 2 weeks if you are seeing benefit with the 100 mg twice daily gabapentin Follow-up orthopedics regarding your other joint issues and concerns   Myracle was seen today for discuss pain.  Diagnoses and all orders for this visit:  Trigeminal neuralgia -     Ambulatory referral to Neurology  Myalgia -     Ambulatory referral to Neurology  Chronic pain syndrome -     Ambulatory referral to Neurology  Other orders -     gabapentin (NEURONTIN) 100 MG capsule; Take 1 capsule (100 mg total) by mouth 2 (two) times daily.    Follow up: Pending referral

## 2023-10-03 ENCOUNTER — Other Ambulatory Visit: Payer: Self-pay | Admitting: Nurse Practitioner

## 2023-10-03 DIAGNOSIS — E559 Vitamin D deficiency, unspecified: Secondary | ICD-10-CM

## 2023-10-04 ENCOUNTER — Ambulatory Visit (INDEPENDENT_AMBULATORY_CARE_PROVIDER_SITE_OTHER): Admitting: Physical Medicine and Rehabilitation

## 2023-10-04 DIAGNOSIS — R200 Anesthesia of skin: Secondary | ICD-10-CM | POA: Diagnosis not present

## 2023-10-04 DIAGNOSIS — M25511 Pain in right shoulder: Secondary | ICD-10-CM | POA: Diagnosis not present

## 2023-10-04 DIAGNOSIS — M542 Cervicalgia: Secondary | ICD-10-CM | POA: Diagnosis not present

## 2023-10-04 DIAGNOSIS — M79641 Pain in right hand: Secondary | ICD-10-CM | POA: Diagnosis not present

## 2023-10-04 DIAGNOSIS — M79642 Pain in left hand: Secondary | ICD-10-CM

## 2023-10-04 DIAGNOSIS — M25512 Pain in left shoulder: Secondary | ICD-10-CM

## 2023-10-04 DIAGNOSIS — G8929 Other chronic pain: Secondary | ICD-10-CM

## 2023-10-04 NOTE — Progress Notes (Signed)
 Pain Scale   Average Pain 5 Patient Right hand Dominate        +Driver, -BT, -Dye Allergies.

## 2023-10-09 ENCOUNTER — Encounter: Payer: Self-pay | Admitting: Physical Medicine and Rehabilitation

## 2023-10-09 NOTE — Procedures (Signed)
 EMG & NCV Findings: Evaluation of the left median motor and the right median motor nerves showed prolonged distal onset latency (L4.9, R6.5 ms) and decreased conduction velocity (Elbow-Wrist, L45, R44 m/s).  The left median (across palm) sensory nerve showed no response (Palm) and prolonged distal peak latency (4.2 ms).  The right median (across palm) sensory nerve showed prolonged distal peak latency (Wrist, 8.8 ms), reduced amplitude (5.1 V), and prolonged distal peak latency (Palm, 5.7 ms).  All remaining nerves (as indicated in the following tables) were within normal limits.  Left vs. Right side comparison data for the median motor nerve indicates abnormal L-R latency difference (1.6 ms).  All remaining left vs. right side differences were within normal limits.    All examined muscles (as indicated in the following table) showed no evidence of electrical instability.    Impression: The above electrodiagnostic study is ABNORMAL and reveals evidence of:  a moderate to severe right median nerve entrapment at the wrist (carpal tunnel syndrome) affecting sensory and motor components.   a moderate left median nerve entrapment at the wrist (carpal tunnel syndrome) affecting sensory and motor components.  There is no significant electrodiagnostic evidence of any other focal nerve entrapment, brachial plexopathy or cervical radiculopathy.   Recommendations: 1.  Follow-up with referring physician. 2.  Continue current management of symptoms. 3.  Continue use of resting splint at night-time and as needed during the day. 4.  Suggest surgical evaluation.  ___________________________ Tiffany Juarez FAAPMR Board Certified, American Board of Physical Medicine and Rehabilitation    Nerve Conduction Studies Anti Sensory Summary Table   Stim Site NR Peak (ms) Norm Peak (ms) P-T Amp (V) Norm P-T Amp Site1 Site2 Delta-P (ms) Dist (cm) Vel (m/s) Norm Vel (m/s)  Left Median Acr Palm Anti Sensory (2nd  Digit)  30.3C  Wrist    *4.2 <3.6 12.2 >10 Wrist Palm  0.0    Palm *NR  <2.0          Right Median Acr Palm Anti Sensory (2nd Digit)  31.7C  Wrist    *8.8 <3.6 *5.1 >10 Wrist Palm 3.1 0.0    Palm    *5.7 <2.0 9.0         Left Radial Anti Sensory (Base 1st Digit)  29.8C  Wrist    1.8 <3.1 35.5  Wrist Base 1st Digit 1.8 0.0    Right Radial Anti Sensory (Base 1st Digit)  31.1C  Wrist    2.0 <3.1 26.1  Wrist Base 1st Digit 2.0 0.0    Left Ulnar Anti Sensory (5th Digit)  30.3C  Wrist    3.1 <3.7 47.3 >15.0 Wrist 5th Digit 3.1 14.0 45 >38  Right Ulnar Anti Sensory (5th Digit)  31.7C  Wrist    2.9 <3.7 35.2 >15.0 Wrist 5th Digit 2.9 14.0 48 >38   Motor Summary Table   Stim Site NR Onset (ms) Norm Onset (ms) O-P Amp (mV) Norm O-P Amp Site1 Site2 Delta-0 (ms) Dist (cm) Vel (m/s) Norm Vel (m/s)  Left Median Motor (Abd Poll Brev)  29.9C  Wrist    *4.9 <4.2 7.4 >5 Elbow Wrist 4.2 19.0 *45 >50  Elbow    9.1  6.7         Right Median Motor (Abd Poll Brev)  31.3C  Wrist    *6.5 <4.2 6.9 >5 Elbow Wrist 4.1 18.0 *44 >50  Elbow    10.6  6.3         Left Ulnar Motor (Abd  Dig Min)  29.9C  Wrist    3.0 <4.2 10.2 >3 B Elbow Wrist 2.9 18.0 62 >53  B Elbow    5.9  9.5  A Elbow B Elbow 1.2 10.0 83 >53  A Elbow    7.1  9.8         Right Ulnar Motor (Abd Dig Min)  31.4C  Wrist    2.6 <4.2 10.0 >3 B Elbow Wrist 2.6 17.5 67 >53  B Elbow    5.2  9.7  A Elbow B Elbow 1.2 11.0 92 >53  A Elbow    6.4  9.3          EMG   Side Muscle Nerve Root Ins Act Fibs Psw Amp Dur Poly Recrt Int Dennie Bible Comment  Right Abd Poll Brev Median C8-T1 Nml Nml Nml Nml Nml 0 Nml Nml   Right 1stDorInt Ulnar C8-T1 Nml Nml Nml Nml Nml 0 Nml Nml   Right PronatorTeres Median C6-7 Nml Nml Nml Nml Nml 0 Nml Nml   Right Biceps Musculocut C5-6 Nml Nml Nml Nml Nml 0 Nml Nml   Right Deltoid Axillary C5-6 Nml Nml Nml Nml Nml 0 Nml Nml     Nerve Conduction Studies Anti Sensory Left/Right Comparison   Stim Site L Lat (ms) R Lat (ms)  L-R Lat (ms) L Amp (V) R Amp (V) L-R Amp (%) Site1 Site2 L Vel (m/s) R Vel (m/s) L-R Vel (m/s)  Median Acr Palm Anti Sensory (2nd Digit)  30.3C  Wrist *4.2 *8.8 4.6 12.2 *5.1 58.2 Wrist Palm     Palm  *5.7   9.0        Radial Anti Sensory (Base 1st Digit)  29.8C  Wrist 1.8 2.0 0.2 35.5 26.1 26.5 Wrist Base 1st Digit     Ulnar Anti Sensory (5th Digit)  30.3C  Wrist 3.1 2.9 0.2 47.3 35.2 25.6 Wrist 5th Digit 45 48 3   Motor Left/Right Comparison   Stim Site L Lat (ms) R Lat (ms) L-R Lat (ms) L Amp (mV) R Amp (mV) L-R Amp (%) Site1 Site2 L Vel (m/s) R Vel (m/s) L-R Vel (m/s)  Median Motor (Abd Poll Brev)  29.9C  Wrist *4.9 *6.5 *1.6 7.4 6.9 6.8 Elbow Wrist *45 *44 1  Elbow 9.1 10.6 1.5 6.7 6.3 6.0       Ulnar Motor (Abd Dig Min)  29.9C  Wrist 3.0 2.6 0.4 10.2 10.0 2.0 B Elbow Wrist 62 67 5  B Elbow 5.9 5.2 0.7 9.5 9.7 2.1 A Elbow B Elbow 83 92 9  A Elbow 7.1 6.4 0.7 9.8 9.3 5.1          Waveforms:

## 2023-10-09 NOTE — Progress Notes (Signed)
 Tiffany Juarez - 43 y.o. female MRN [id number]  Date of birth: [birth year]  Office Visit Note: Visit Date: 10/04/2023 PCP: Tollie Eth, NP Referred by: Cammy Copa, MD  Subjective: Chief Complaint  Patient presents with   Left Hand - Numbness, Weakness, Pain   Right Hand - Numbness, Weakness, Pain   HPI: Tiffany Juarez is a 43 y.o. female who comes in today at the request of Dr. Burnard Bunting for evaluation and management of chronic, worsening and severe pain, numbness and tingling in the Bilateral upper extremities.  Patient is Right hand dominant.  She reports a chronic fairly long history of multiple orthopedic complaints but more recently progressive worsening of bilateral shoulder pain with bilateral hand pain with numbness and tingling globally.  She does get a lot of nocturnal complaints and has been told in the past that she had carpal tunnel syndrome.  She did have prior electrodiagnostic study by Dr. Levert Feinstein at [facility] in 2019.  The study is reviewed in full below and unfortunately the interpretation just says carpal tunnel syndrome however looking at the nerve conductions it appears to be a mild left median neuropathy at the wrist and a slightly moderate right median neuropathy at the wrist.  She has been using splints in the past and medication management without much relief.  Her case is complicated by history of fibromyalgia and fatigue.  She is followed by Dr. Lendon Colonel in rheumatology.  She does have prediabetes with hemoglobin A1c in the 5.8-6.1 range.  No history of polyneuropathy.  She gets pain in the hands with activity again some nocturnal complaints a lot of paresthesias and weakness.   I spent more than 30 minutes speaking face-to-face with the patient with 50% of the time in counseling and discussing coordination of care.       Review of Systems  Musculoskeletal:  Positive for joint pain and neck pain.  Neurological:  Positive for tingling and  focal weakness.  All other systems reviewed and are negative.  Otherwise per HPI.  Assessment & Plan: Visit Diagnoses:    ICD-10-CM   1. Bilateral hand numbness  R20.0 NCV with EMG (electromyography)    2. Chronic pain of both shoulders  M25.511    G89.29    M25.512     3. Bilateral hand pain  M79.641    M79.642     4. Cervicalgia  M54.2        Plan: Impression: Clinically complex picture with bilateral global hand pain numbness and tingling in neck and arm pain but somewhat consistent with likely carpal tunnel syndrome.  Prior electrodiagnostic study positive for this several years ago but fairly mild to moderate.  Electrodiagnostic study performed today.  The above electrodiagnostic study is ABNORMAL and reveals evidence of:  a moderate to severe right median nerve entrapment at the wrist (carpal tunnel syndrome) affecting sensory and motor components.   a moderate left median nerve entrapment at the wrist (carpal tunnel syndrome) affecting sensory and motor components.  There is no significant electrodiagnostic evidence of any other focal nerve entrapment, brachial plexopathy or cervical radiculopathy.   Recommendations: 1.  Follow-up with referring physician. 2.  Continue current management of symptoms. 3.  Continue use of resting splint at night-time and as needed during the day. 4.  Suggest surgical evaluation.   Meds & Orders: No orders of the defined types were placed in this encounter.   Orders Placed This Encounter  Procedures   NCV  with EMG (electromyography)    Follow-up: Return for  Burnard Bunting, MD.   Procedures: No procedures performed  EMG & NCV Findings: Evaluation of the left median motor and the right median motor nerves showed prolonged distal onset latency (L4.9, R6.5 ms) and decreased conduction velocity (Elbow-Wrist, L45, R44 m/s).  The left median (across palm) sensory nerve showed no response (Palm) and prolonged distal peak latency (4.2 ms).   The right median (across palm) sensory nerve showed prolonged distal peak latency (Wrist, 8.8 ms), reduced amplitude (5.1 V), and prolonged distal peak latency (Palm, 5.7 ms).  All remaining nerves (as indicated in the following tables) were within normal limits.  Left vs. Right side comparison data for the median motor nerve indicates abnormal L-R latency difference (1.6 ms).  All remaining left vs. right side differences were within normal limits.    All examined muscles (as indicated in the following table) showed no evidence of electrical instability.    Impression: The above electrodiagnostic study is ABNORMAL and reveals evidence of:  a moderate to severe right median nerve entrapment at the wrist (carpal tunnel syndrome) affecting sensory and motor components.   a moderate left median nerve entrapment at the wrist (carpal tunnel syndrome) affecting sensory and motor components.  There is no significant electrodiagnostic evidence of any other focal nerve entrapment, brachial plexopathy or cervical radiculopathy.   Recommendations: 1.  Follow-up with referring physician. 2.  Continue current management of symptoms. 3.  Continue use of resting splint at night-time and as needed during the day. 4.  Suggest surgical evaluation.  ___________________________ Naaman Plummer FAAPMR Board Certified, American Board of Physical Medicine and Rehabilitation    Nerve Conduction Studies Anti Sensory Summary Table   Stim Site NR Peak (ms) Norm Peak (ms) P-T Amp (V) Norm P-T Amp Site1 Site2 Delta-P (ms) Dist (cm) Vel (m/s) Norm Vel (m/s)  Left Median Acr Palm Anti Sensory (2nd Digit)  30.3C  Wrist    *4.2 <3.6 12.2 >10 Wrist Palm  0.0    Palm *NR  <2.0          Right Median Acr Palm Anti Sensory (2nd Digit)  31.7C  Wrist    *8.8 <3.6 *5.1 >10 Wrist Palm 3.1 0.0    Palm    *5.7 <2.0 9.0         Left Radial Anti Sensory (Base 1st Digit)  29.8C  Wrist    1.8 <3.1 35.5  Wrist Base 1st Digit  1.8 0.0    Right Radial Anti Sensory (Base 1st Digit)  31.1C  Wrist    2.0 <3.1 26.1  Wrist Base 1st Digit 2.0 0.0    Left Ulnar Anti Sensory (5th Digit)  30.3C  Wrist    3.1 <3.7 47.3 >15.0 Wrist 5th Digit 3.1 14.0 45 >38  Right Ulnar Anti Sensory (5th Digit)  31.7C  Wrist    2.9 <3.7 35.2 >15.0 Wrist 5th Digit 2.9 14.0 48 >38   Motor Summary Table   Stim Site NR Onset (ms) Norm Onset (ms) O-P Amp (mV) Norm O-P Amp Site1 Site2 Delta-0 (ms) Dist (cm) Vel (m/s) Norm Vel (m/s)  Left Median Motor (Abd Poll Brev)  29.9C  Wrist    *4.9 <4.2 7.4 >5 Elbow Wrist 4.2 19.0 *45 >50  Elbow    9.1  6.7         Right Median Motor (Abd Poll Brev)  31.3C  Wrist    *6.5 <4.2 6.9 >5 Elbow Wrist 4.1 18.0 *  44 >50  Elbow    10.6  6.3         Left Ulnar Motor (Abd Dig Min)  29.9C  Wrist    3.0 <4.2 10.2 >3 B Elbow Wrist 2.9 18.0 62 >53  B Elbow    5.9  9.5  A Elbow B Elbow 1.2 10.0 83 >53  A Elbow    7.1  9.8         Right Ulnar Motor (Abd Dig Min)  31.4C  Wrist    2.6 <4.2 10.0 >3 B Elbow Wrist 2.6 17.5 67 >53  B Elbow    5.2  9.7  A Elbow B Elbow 1.2 11.0 92 >53  A Elbow    6.4  9.3          EMG   Side Muscle Nerve Root Ins Act Fibs Psw Amp Dur Poly Recrt Int Dennie Bible Comment  Right Abd Poll Brev Median C8-T1 Nml Nml Nml Nml Nml 0 Nml Nml   Right 1stDorInt Ulnar C8-T1 Nml Nml Nml Nml Nml 0 Nml Nml   Right PronatorTeres Median C6-7 Nml Nml Nml Nml Nml 0 Nml Nml   Right Biceps Musculocut C5-6 Nml Nml Nml Nml Nml 0 Nml Nml   Right Deltoid Axillary C5-6 Nml Nml Nml Nml Nml 0 Nml Nml     Nerve Conduction Studies Anti Sensory Left/Right Comparison   Stim Site L Lat (ms) R Lat (ms) L-R Lat (ms) L Amp (V) R Amp (V) L-R Amp (%) Site1 Site2 L Vel (m/s) R Vel (m/s) L-R Vel (m/s)  Median Acr Palm Anti Sensory (2nd Digit)  30.3C  Wrist *4.2 *8.8 4.6 12.2 *5.1 58.2 Wrist Palm     Palm  *5.7   9.0        Radial Anti Sensory (Base 1st Digit)  29.8C  Wrist 1.8 2.0 0.2 35.5 26.1 26.5 Wrist Base 1st Digit      Ulnar Anti Sensory (5th Digit)  30.3C  Wrist 3.1 2.9 0.2 47.3 35.2 25.6 Wrist 5th Digit 45 48 3   Motor Left/Right Comparison   Stim Site L Lat (ms) R Lat (ms) L-R Lat (ms) L Amp (mV) R Amp (mV) L-R Amp (%) Site1 Site2 L Vel (m/s) R Vel (m/s) L-R Vel (m/s)  Median Motor (Abd Poll Brev)  29.9C  Wrist *4.9 *6.5 *1.6 7.4 6.9 6.8 Elbow Wrist *45 *44 1  Elbow 9.1 10.6 1.5 6.7 6.3 6.0       Ulnar Motor (Abd Dig Min)  29.9C  Wrist 3.0 2.6 0.4 10.2 10.0 2.0 B Elbow Wrist 62 67 5  B Elbow 5.9 5.2 0.7 9.5 9.7 2.1 A Elbow B Elbow 83 92 9  A Elbow 7.1 6.4 0.7 9.8 9.3 5.1          Waveforms:                     Clinical History: Nerve conduction study: Left sural, superficial peroneal sensory responses were normal.  Left tibial, peroneal to EDB motor responses were normal.  Bilateral ulnar sensory responses were normal.  Right median sensory response was absent.  Left median sensory response showed mildly prolonged peak latency.  Left median motor responses were normal.  Right median motor responses showed mildly prolonged peak latency with normal C map amplitude, conduction velocity.   Electromyography: Selective needle examinations were performed at right upper and lower extremity muscles.   The only abnormalities mildly decreased recruitment pattern at the right  abductor pollicis brevis.   Conclusion: This is an abnormal study.  There is electrodiagnostic evidence of bilateral median neuropathy across the wrist, consistent with bilateral carpal tunnel syndromes, right side is moderate, left side is mild, mainly demyelinating nature.  There is no evidence of large fiber peripheral neuropathy, or inflammatory myopathy.       ------------------------------- Levert Feinstein, M.D.   She reports that she has been smoking cigarettes. She has never used smokeless tobacco.  Recent Labs    03/21/23 1000  HGBA1C 5.8*    Objective:  VS:  HT:    WT:   BMI:     BP:   HR: bpm  TEMP: (  )  RESP:  Physical Exam Vitals and nursing note reviewed.  Constitutional:      General: She is not in acute distress.    Appearance: Normal appearance. She is well-developed. She is not ill-appearing.  HENT:     Head: Normocephalic and atraumatic.  Eyes:     Conjunctiva/sclera: Conjunctivae normal.     Pupils: Pupils are equal, round, and reactive to light.  Cardiovascular:     Rate and Rhythm: Normal rate.     Pulses: Normal pulses.  Pulmonary:     Effort: Pulmonary effort is normal.  Musculoskeletal:        General: No swelling, tenderness or deformity.     Right lower leg: No edema.     Left lower leg: No edema.     Comments: Inspection reveals no atrophy of the bilateral APB or FDI or hand intrinsics. There is no swelling, color changes, allodynia or dystrophic changes. There is 5 out of 5 strength in the bilateral wrist extension, finger abduction and long finger flexion. There is intact sensation to light touch in all dermatomal and peripheral nerve distributions. There is a negative Froment's test bilaterally. There is a positive Phalen's test bilaterally. There is a negative Hoffmann's test bilaterally.  Skin:    General: Skin is warm and dry.     Findings: No erythema or rash.  Neurological:     General: No focal deficit present.     Mental Status: She is alert and oriented to person, place, and time.     Sensory: No sensory deficit.     Motor: No weakness or abnormal muscle tone.     Coordination: Coordination normal.     Gait: Gait normal.  Psychiatric:        Mood and Affect: Mood normal.        Behavior: Behavior normal.     Ortho Exam  Imaging: No results found.  Past Medical/Family/Surgical/Social History: Medications & Allergies reviewed per EMR, new medications updated. Patient Active Problem List   Diagnosis Date Noted   Hives of unknown origin 03/21/2023   Encounter for annual physical exam 03/21/2023   Painful swelling of joint 12/03/2022   Hair  loss 12/03/2022   Mixed hyperlipidemia 11/27/2021   Bilateral carpal tunnel syndrome 02/15/2021   Fibroid 09/12/2020   Vitamin D deficiency 11/11/2018   Prediabetes 11/06/2018   Other chronic pain 05/26/2018   Sleep walking 05/15/2018   Bipolar disorder (HCC)    Pain of both hip joints 10/09/2017   Past Medical History:  Diagnosis Date   Alteration consciousness 05/26/2018   Anginal pain (HCC)    Angio-edema 12/03/2022   Bipolar disorder (HCC)    Chronic intractable headache 12/03/2022   Class 1 obesity with serious comorbidity and body mass index (BMI) of 31.0 to 31.9 in  adult 11/27/2021   Depression    Dry skin 11/17/2019   Fibroid    Fibromyalgia    Finger numbness 02/15/2021   Multiple skin nodules 12/03/2022   Night sweats 11/17/2019   Numbness and tingling    Obesity (BMI 30-39.9) 11/17/2019   Pain in both wrists 02/15/2021   Paresthesia 10/09/2017   Peeling skin 12/03/2022   Prediabetes 11/06/2018   Pure hypercholesterolemia 11/17/2019   Smoker 11/17/2019   Family History  Problem Relation Age of Onset   Depression Father    Schizophrenia Father    Depression Brother    Healthy Mother    Hypertension Maternal Grandmother    Cancer Maternal Grandmother    Diabetes Maternal Aunt    Past Surgical History:  Procedure Laterality Date   CESAREAN SECTION     CESAREAN SECTION N/A 01/19/2021   Procedure: CESAREAN SECTION;  Surgeon: Myna Hidalgo, DO;  Location: [facility];  Service: Obstetrics;  Laterality: N/A;   wisdom teeth     Social History   Occupational History   Occupation: Unemployed  Tobacco Use   Smoking status: Some Days    Current packs/day: 0.50    Types: Cigarettes   Smokeless tobacco: Never  Vaping Use   Vaping status: Some Days   Substances: Nicotine, CBD, Flavoring  Substance and Sexual Activity   Alcohol use: No    Comment: quit 2014   Drug use: No   Sexual activity: Yes    Birth control/protection: None

## 2023-10-15 DIAGNOSIS — F3131 Bipolar disorder, current episode depressed, mild: Secondary | ICD-10-CM | POA: Diagnosis not present

## 2023-10-22 DIAGNOSIS — M797 Fibromyalgia: Secondary | ICD-10-CM | POA: Diagnosis not present

## 2023-10-22 DIAGNOSIS — R5382 Chronic fatigue, unspecified: Secondary | ICD-10-CM | POA: Diagnosis not present

## 2023-10-22 DIAGNOSIS — L508 Other urticaria: Secondary | ICD-10-CM | POA: Diagnosis not present

## 2023-10-23 DIAGNOSIS — F3131 Bipolar disorder, current episode depressed, mild: Secondary | ICD-10-CM | POA: Diagnosis not present

## 2023-10-30 ENCOUNTER — Ambulatory Visit: Payer: Self-pay

## 2023-10-30 NOTE — Telephone Encounter (Signed)
  Chief Complaint: face and head pain Symptoms: pain Frequency: pt states ongoing for years Pertinent Negatives: Patient denies fever, SOB, jaw/tooth pain, swelling, redness Disposition: [] ED /[] Urgent Care (no appt availability in office) / [] Appointment(In office/virtual)/ []  [facility]/ [] Home Care/ [] Refused Recommended Disposition /[]  Mobile Bus/ [x]  Follow-up with PCP Additional Notes: Pt recently seen for face/head pain. Given gabapentin, states initially this helped however, after the first few days of gabapentin the pain has now returned. Pt states that it is not worse than prior to the gabapentin. Pt requesting increasing her amount/doses of gabapentin, was told to call clinic if it didn't control the pain. Pt states that she has chronic hives so that is still present as well. Copied from CRM [id number]. Topic: Clinical - Medication Question >> Oct 30, 2023  8:45 AM Godfrey Pick wrote: Reason for CRM: patient is currently taking gabapentin (NEURONTIN) 100 MG capsule. Patient states the medication was working but as of 3-4 days ago patient notice that the medication was no longer effective and would like to discuss a medication dose increase. Reason for Disposition . Face pain  is a chronic symptom (recurrent or ongoing AND present > 4 weeks)  Answer Assessment - Initial Assessment Questions 1. ONSET: "When did the pain start?" (e.g., minutes, hours, days)     years 2. ONSET: "Does the pain come and go, or has it been constant since it started?" (e.g., constant, intermittent, fleeting)     Gabapentin initially helped, but is not helping now, 3. SEVERITY: "How bad is the pain?"   (Scale 1-10; mild, moderate or severe)   - MILD (1-3): doesn't interfere with normal activities    - MODERATE (4-7): interferes with normal activities or awakens from sleep    - SEVERE (8-10): excruciating pain, unable to do any normal activities      5 4. LOCATION: "Where does it hurt?"       In head, face, goes into face sometimes 5. RASH: "Is there any redness, rash, or swelling of the face?"     Chronic hives, worse than normal  6. FEVER: "Do you have a fever?" If Yes, ask: "What is it, how was it measured, and when did it start?"      denies 7. OTHER SYMPTOMS: "Do you have any other symptoms?" (e.g., fever, toothache, nasal discharge, nasal congestion, clicking sensation in jaw joint)     denies 8. PREGNANCY: "Is there any chance you are pregnant?" "When was your last menstrual period?"     denies  Protocols used: Face Pain-A-AH

## 2023-10-31 ENCOUNTER — Other Ambulatory Visit: Payer: Self-pay | Admitting: Nurse Practitioner

## 2023-10-31 NOTE — Telephone Encounter (Signed)
 Please let her know that she can increase the gabapentin to 300mg  twice a day until she is seen by neurology.  If refills are needed, please let me know  OK to send referral to dermatology for the urticaria. It looks like all of the general recommendations have been exhausted with PCP and Rheumatology. She may need more specialty treatment.

## 2023-11-15 DIAGNOSIS — F3131 Bipolar disorder, current episode depressed, mild: Secondary | ICD-10-CM | POA: Diagnosis not present

## 2023-11-16 DIAGNOSIS — F319 Bipolar disorder, unspecified: Secondary | ICD-10-CM | POA: Diagnosis not present

## 2023-11-18 DIAGNOSIS — K080 Exfoliation of teeth due to systemic causes: Secondary | ICD-10-CM | POA: Diagnosis not present

## 2023-11-20 ENCOUNTER — Encounter: Payer: Self-pay | Admitting: Orthopedic Surgery

## 2023-11-20 ENCOUNTER — Ambulatory Visit (INDEPENDENT_AMBULATORY_CARE_PROVIDER_SITE_OTHER): Admitting: Orthopedic Surgery

## 2023-11-20 DIAGNOSIS — R200 Anesthesia of skin: Secondary | ICD-10-CM

## 2023-11-20 DIAGNOSIS — M7532 Calcific tendinitis of left shoulder: Secondary | ICD-10-CM | POA: Diagnosis not present

## 2023-11-20 NOTE — Progress Notes (Signed)
 Office Visit Note   Patient: Tiffany Juarez           Date of Birth: 1980/11/23           MRN: [id number] Visit Date: 11/20/2023 Requested by: Annella Kief, NP [address] [place],  [place] [address] PCP: Annella Kief, NP  Subjective: Chief Complaint  Patient presents with   Other    Follow up to review EMG/NCV    HPI: Tiffany Juarez is a 43 y.o. female who presents to the office reporting Bilateral hand pain numbness and tingling along with left shoulder pain.  She reports a lot of numbness in her hand.  She has considered using splints before but is hard for her to keep them on at night.  She does stay at home.  She has a special needs child.  Also has a history of fibromyalgia.  She states that her left shoulder is "hurting again".  She did have a subacromial injection in February of this year which helped her significantly.  She has had EMG nerve study which is reviewed.  Does show severe carpal tunnel syndrome on the right and moderate on the left..                ROS: All systems reviewed are negative as they relate to the chief complaint within the history of present illness.  Patient denies fevers or chills.  Assessment & Plan: Visit Diagnoses:  1. Bilateral hand numbness   2. Calcific tendonitis of left shoulder     Plan: Impression is bilateral hand numbness and tingling consistent with severe carpal tunnel syndrome on the right moderate on the left.  She also has left shoulder bursitis.  Subacromial injection performed today.  We will post her for right carpal tunnel release in July.  The risk and benefits are discussed with the patient including not limited to infection nerve and vessel damage decreased grip strength for 2 to 3 months along with potential for loss of function.  Patient understands risk and benefits and wishes to proceed.  All questions answered.  Her shoulder symptomatic calcific tendinitis is not bad enough yet for any type of surgical intervention.   Therefore we will try a second and final subacromial injection for this year today.  Follow-Up Instructions: No follow-ups on file.   Orders:  No orders of the defined types were placed in this encounter.  No orders of the defined types were placed in this encounter.     Procedures: Large Joint Inj: L subacromial bursa on 11/20/2023 11:01 AM Indications: diagnostic evaluation and pain Details: 18 G 1.5 in needle, posterior approach  Arthrogram: No  Medications: 9 mL bupivacaine  0.5 %; 40 mg methylPREDNISolone  acetate 40 MG/ML; 5 mL lidocaine  1 % Outcome: tolerated well, no immediate complications Procedure, treatment alternatives, risks and benefits explained, specific risks discussed. Consent was given by the patient. Immediately prior to procedure a time out was called to verify the correct patient, procedure, equipment, support staff and site/side marked as required. Patient was prepped and draped in the usual sterile fashion.       Clinical Data: No additional findings.  Objective: Vital Signs: There were no vitals taken for this visit.  Physical Exam:  Constitutional: Patient appears well-developed HEENT:  Head: Normocephalic Eyes:EOM are normal Neck: Normal range of motion Cardiovascular: Normal rate Pulmonary/chest: Effort normal Neurologic: Patient is alert Skin: Skin is warm Psychiatric: Patient has normal mood and affect  Ortho Exam: Patient has bilateral 5  out of 5 grip EPL FPL interosseous wrist flexion wrist extension bicep triceps and deltoid strength.  Bilateral palpable radial pulses and no paresthesias C5-T1 in either arm.  Neck range of motion flexion chin to chest with extension approximately 50 degrees with approximately 50 degrees of rotation bilaterally.  No masses lymphadenopathy or skin changes around the neck or shoulder girdle region bilaterally  Ortho exam demonstrates symmetric grip EPL FPL interosseous function in both hands.  No definite  abductor pollicis brevis wasting.  Abductor pollicis brevis strength is 5 out of 5 bilaterally.  Radial pulse intact bilaterally.  No other masses lymphadenopathy or skin changes noted in the hand region.  Left shoulder has symmetric passive range of motion at 60/95/160 bilaterally.  Rotator cuff strength intact infraspinatus supraspinatus and subscap muscle testing.  No coarse grinding or crepitus with internal/external rotation of the shoulder.  Specialty Comments:  Nerve conduction study: Left sural, superficial peroneal sensory responses were normal.  Left tibial, peroneal to EDB motor responses were normal.  Bilateral ulnar sensory responses were normal.  Right median sensory response was absent.  Left median sensory response showed mildly prolonged peak latency.  Left median motor responses were normal.  Right median motor responses showed mildly prolonged peak latency with normal C map amplitude, conduction velocity.   Electromyography: Selective needle examinations were performed at right upper and lower extremity muscles.   The only abnormalities mildly decreased recruitment pattern at the right abductor pollicis brevis.   Conclusion: This is an abnormal study.  There is electrodiagnostic evidence of bilateral median neuropathy across the wrist, consistent with bilateral carpal tunnel syndromes, right side is moderate, left side is mild, mainly demyelinating nature.  There is no evidence of large fiber peripheral neuropathy, or inflammatory myopathy.       ------------------------------- Phebe Brasil, M.D.  Imaging: No results found.   PMFS History: Patient Active Problem List   Diagnosis Date Noted   Hives of unknown origin 03/21/2023   Encounter for annual physical exam 03/21/2023   Painful swelling of joint 12/03/2022   Hair loss 12/03/2022   Mixed hyperlipidemia 11/27/2021   Bilateral carpal tunnel syndrome 02/15/2021   Fibroid 09/12/2020   Vitamin D  deficiency 11/11/2018    Prediabetes 11/06/2018   Other chronic pain 05/26/2018   Sleep walking 05/15/2018   Bipolar disorder (HCC)    Pain of both hip joints 10/09/2017   Past Medical History:  Diagnosis Date   Alteration consciousness 05/26/2018   Anginal pain (HCC)    Angio-edema 12/03/2022   Bipolar disorder (HCC)    Chronic intractable headache 12/03/2022   Class 1 obesity with serious comorbidity and body mass index (BMI) of 31.0 to 31.9 in adult 11/27/2021   Depression    Dry skin 11/17/2019   Fibroid    Fibromyalgia    Finger numbness 02/15/2021   Multiple skin nodules 12/03/2022   Night sweats 11/17/2019   Numbness and tingling    Obesity (BMI 30-39.9) 11/17/2019   Pain in both wrists 02/15/2021   Paresthesia 10/09/2017   Peeling skin 12/03/2022   Prediabetes 11/06/2018   Pure hypercholesterolemia 11/17/2019   Smoker 11/17/2019    Family History  Problem Relation Age of Onset   Depression Father    Schizophrenia Father    Depression Brother    Healthy Mother    Hypertension Maternal Grandmother    Cancer Maternal Grandmother    Diabetes Maternal Aunt     Past Surgical History:  Procedure Laterality Date   CESAREAN  SECTION     CESAREAN SECTION N/A 01/19/2021   Procedure: CESAREAN SECTION;  Surgeon: Ozan, Jennifer, DO;  Location: [facility];  Service: Obstetrics;  Laterality: N/A;   wisdom teeth     Social History   Occupational History   Occupation: Unemployed  Tobacco Use   Smoking status: Some Days    Current packs/day: 0.50    Types: Cigarettes   Smokeless tobacco: Never  Vaping Use   Vaping status: Some Days   Substances: Nicotine, CBD, Flavoring  Substance and Sexual Activity   Alcohol use: No    Comment: quit 2014   Drug use: No   Sexual activity: Yes    Birth control/protection: None

## 2023-11-21 MED ORDER — METHYLPREDNISOLONE ACETATE 40 MG/ML IJ SUSP
40.0000 mg | INTRAMUSCULAR | Status: AC | PRN
  Administered 2023-11-20: 40 mg via INTRA_ARTICULAR

## 2023-11-21 MED ORDER — LIDOCAINE HCL 1 % IJ SOLN
5.0000 mL | INTRAMUSCULAR | Status: AC | PRN
  Administered 2023-11-20: 5 mL

## 2023-11-21 MED ORDER — BUPIVACAINE HCL 0.5 % IJ SOLN
9.0000 mL | INTRAMUSCULAR | Status: AC | PRN
  Administered 2023-11-20: 9 mL via INTRA_ARTICULAR

## 2023-11-25 DIAGNOSIS — L501 Idiopathic urticaria: Secondary | ICD-10-CM | POA: Diagnosis not present

## 2023-11-26 ENCOUNTER — Ambulatory Visit: Payer: Medicare Other

## 2023-11-26 DIAGNOSIS — Z0000 Encounter for general adult medical examination without abnormal findings: Secondary | ICD-10-CM

## 2023-11-26 NOTE — Progress Notes (Signed)
 Subjective:   Tiffany Juarez is a 43 y.o. who presents for a Medicare Wellness preventive visit.  Visit Complete: Virtual I connected with  Paris Bolds on 11/26/23 by a audio enabled telemedicine application and verified that I am speaking with the correct person using two identifiers.  Patient Location: Home  Provider Location: Office/Clinic  I discussed the limitations of evaluation and management by telemedicine. The patient expressed understanding and agreed to proceed.  Vital Signs: Because this visit was a virtual/telehealth visit, some criteria may be missing or patient reported. Any vitals not documented were not able to be obtained and vitals that have been documented are patient reported.  VideoError- Librarian, academic were attempted between this provider and patient, however failed, due to patient having technical difficulties OR patient did not have access to video capability.  We continued and completed visit with audio only.   Persons Participating in Visit: Patient.  AWV Questionnaire: No: Patient Medicare AWV questionnaire was not completed prior to this visit.  Cardiac Risk Factors include: advanced age (>38men, >70 women);dyslipidemia     Objective:    Today's Vitals   11/26/23 1006  PainSc: 5    There is no height or weight on file to calculate BMI.     11/26/2023   10:15 AM 11/20/2022   10:30 AM 11/10/2021   10:02 AM 01/19/2021    7:55 AM 05/06/2017    9:08 AM 04/22/2017    8:49 AM 03/15/2017    8:49 AM  Advanced Directives  Does Patient Have a Medical Advance Directive? No No No No No No No  Would patient like information on creating a medical advance directive? No - Patient declined   No - Patient declined No - Patient declined No - Patient declined No - Patient declined    Current Medications (verified) Outpatient Encounter Medications as of 11/26/2023  Medication Sig   busPIRone (BUSPAR) 15 MG tablet Take 15 mg by mouth 3  (three) times daily.   DULoxetine  (CYMBALTA ) 30 MG capsule Take 1 capsule (30 mg total) by mouth daily.   famotidine (PEPCID) 40 MG tablet Take 40 mg by mouth daily.   gabapentin  (NEURONTIN ) 100 MG capsule Take 1 capsule (100 mg total) by mouth 2 (two) times daily.   hydrOXYzine  (ATARAX ) 25 MG tablet Take 25 mg by mouth 3 times/day as needed-between meals & bedtime.   lurasidone (LATUDA) 20 MG TABS tablet Take by mouth.   methocarbamol  (ROBAXIN -750) 750 MG tablet Take 1 tablet (750 mg total) by mouth every 6 (six) hours as needed (muscle pain).   mometasone (ELOCON) 0.1 % cream Apply topically.   temazepam (RESTORIL) 15 MG capsule Take 15 mg by mouth at bedtime as needed. As needed   Vitamin D , Ergocalciferol , (DRISDOL ) 1.25 MG (50000 UNIT) CAPS capsule TAKE 1 CAPSULE BY MOUTH EVERY 7 DAYS   No facility-administered encounter medications on file as of 11/26/2023.    Allergies (verified) Cephalosporins, Peanut-containing drug products, and Penicillins   History: Past Medical History:  Diagnosis Date   Alteration consciousness 05/26/2018   Anginal pain (HCC)    Angio-edema 12/03/2022   Bipolar disorder (HCC)    Chronic intractable headache 12/03/2022   Class 1 obesity with serious comorbidity and body mass index (BMI) of 31.0 to 31.9 in adult 11/27/2021   Depression    Dry skin 11/17/2019   Fibroid    Fibromyalgia    Finger numbness 02/15/2021   Multiple skin nodules 12/03/2022   Night sweats  11/17/2019   Numbness and tingling    Obesity (BMI 30-39.9) 11/17/2019   Pain in both wrists 02/15/2021   Paresthesia 10/09/2017   Peeling skin 12/03/2022   Prediabetes 11/06/2018   Pure hypercholesterolemia 11/17/2019   Smoker 11/17/2019   Past Surgical History:  Procedure Laterality Date   CESAREAN SECTION     CESAREAN SECTION N/A 01/19/2021   Procedure: CESAREAN SECTION;  Surgeon: Ozan, Jennifer, DO;  Location: [facility];  Service: Obstetrics;  Laterality: N/A;   wisdom teeth      Family History  Problem Relation Age of Onset   Depression Father    Schizophrenia Father    Depression Brother    Healthy Mother    Hypertension Maternal Grandmother    Cancer Maternal Grandmother    Diabetes Maternal Aunt    Social History   Socioeconomic History   Marital status: Single    Spouse name: Not on file   Number of children: 3   Years of education: 12   Highest education level: High school graduate  Occupational History   Occupation: Unemployed  Tobacco Use   Smoking status: Some Days    Current packs/day: 0.50    Types: Cigarettes   Smokeless tobacco: Never  Vaping Use   Vaping status: Former   Substances: Nicotine, CBD, Flavoring  Substance and Sexual Activity   Alcohol use: No    Comment: quit 2014   Drug use: No   Sexual activity: Yes    Birth control/protection: None  Other Topics Concern   Not on file  Social History Narrative   Lives at home with family.   Right-handed.   2 cups caffeine per day.   Social Drivers of Corporate investment banker Strain: Low Risk  (11/26/2023)   Overall Financial Resource Strain (CARDIA)    Difficulty of Paying Living Expenses: Not hard at all  Food Insecurity: No Food Insecurity (11/26/2023)   Hunger Vital Sign    Worried About Running Out of Food in the Last Year: Never true    Ran Out of Food in the Last Year: Never true  Transportation Needs: No Transportation Needs (11/26/2023)   PRAPARE - Administrator, Civil Service (Medical): No    Lack of Transportation (Non-Medical): No  Physical Activity: Inactive (11/26/2023)   Exercise Vital Sign    Days of Exercise per Week: 0 days    Minutes of Exercise per Session: 0 min  Stress: Stress Concern Present (11/26/2023)   [employer] of Occupational Health - Occupational Stress Questionnaire    Feeling of Stress : To some extent  Social Connections: Socially Isolated (11/26/2023)   Social Connection and Isolation Panel [NHANES]    Frequency of  Communication with Friends and Family: Twice a week    Frequency of Social Gatherings with Friends and Family: Never    Attends Religious Services: Never    Database administrator or Organizations: No    Attends Engineer, structural: Never    Marital Status: Living with partner    Tobacco Counseling Ready to quit: Not Answered Counseling given: Not Answered    Clinical Intake:  Pre-visit preparation completed: Yes  Pain : 0-10 Pain Score: 5  Pain Type: Chronic pain Pain Location: Generalized Pain Descriptors / Indicators: Aching Pain Onset: More than a month ago Pain Frequency: Constant     Nutritional Risks: None Diabetes: No  Lab Results  Component Value Date   HGBA1C 5.8 (H) 03/21/2023   HGBA1C  5.6 11/27/2021   HGBA1C 6.0 (H) 06/28/2020     How often do you need to have someone help you when you read instructions, pamphlets, or other written materials from your doctor or pharmacy?: 1 - Never  Interpreter Needed?: No  Information entered by :: NAllen LPN   Activities of Daily Living     11/26/2023   10:07 AM  In your present state of health, do you have any difficulty performing the following activities:  Hearing? 0  Vision? 0  Difficulty concentrating or making decisions? 1  Comment sometimes  Walking or climbing stairs? 1  Comment when in a lot pain  Dressing or bathing? 0  Doing errands, shopping? 0  Preparing Food and eating ? N  Using the Toilet? N  In the past six months, have you accidently leaked urine? Y  Do you have problems with loss of bowel control? N  Managing your Medications? N  Managing your Finances? N  Housekeeping or managing your Housekeeping? N    Patient Care Team: Early, Sara E, NP as PCP - General (Nurse Practitioner)  Indicate any recent Medical Services you may have received from other than [facility] providers in the past year (date may be approximate).     Assessment:   This is a routine wellness examination for  Malasia.  Hearing/Vision screen Hearing Screening - Comments:: Denies hearing issues Vision Screening - Comments:: No regular eye exams   Goals Addressed             This Visit's Progress    Patient Stated       11/26/2023, denies goals       Depression Screen     11/26/2023   10:17 AM 05/09/2023   10:18 AM 03/21/2023    9:12 AM 11/20/2022   10:31 AM 11/27/2021   10:02 AM 11/10/2021   10:02 AM 11/17/2020    9:15 AM  PHQ 2/9 Scores  PHQ - 2 Score 1 0 0 4 6 6 6   PHQ- 9 Score 11 8  11 18 16 21     Fall Risk     11/26/2023   10:17 AM 03/21/2023    9:11 AM 11/20/2022   10:30 AM 11/27/2021   10:01 AM 11/10/2021   10:02 AM  Fall Risk   Falls in the past year? 0 0 0 0 0  Number falls in past yr: 0 0 0 0 0  Injury with Fall? 0 0 0 0 0  Risk for fall due to : Medication side effect No Fall Risks Medication side effect No Fall Risks Medication side effect  Follow up Falls prevention discussed;Falls evaluation completed Falls evaluation completed Falls prevention discussed;Education provided;Falls evaluation completed Falls evaluation completed Falls evaluation completed;Education provided;Falls prevention discussed    MEDICARE RISK AT HOME:  Medicare Risk at Home Any stairs in or around the home?: Yes If so, are there any without handrails?: No Home free of loose throw rugs in walkways, pet beds, electrical cords, etc?: Yes Adequate lighting in your home to reduce risk of falls?: Yes Life alert?: No Use of a cane, walker or w/c?: No Grab bars in the bathroom?: No Shower chair or bench in shower?: No Elevated toilet seat or a handicapped toilet?: No  TIMED UP AND GO:  Was the test performed?  No  Cognitive Function: 6CIT completed        11/26/2023   10:20 AM 11/20/2022   10:32 AM 11/10/2021   10:06 AM  6CIT Screen  What Year? 0 points 0 points 0 points  What month? 0 points 0 points 0 points  What time? 0 points 0 points 0 points  Count back from 20 0 points 0 points 0 points   Months in reverse 0 points 0 points 0 points  Repeat phrase 2 points 2 points 0 points  Total Score 2 points 2 points 0 points    Immunizations Immunization History  Administered Date(s) Administered   Tdap 04/22/2017    Screening Tests Health Maintenance  Topic Date Due   Pneumococcal Vaccine 66-53 Years old (1 of 2 - PCV) Never done   COVID-19 Vaccine (1 - 2024-25 season) Never done   INFLUENZA VACCINE  02/21/2024   Medicare Annual Wellness (AWV)  11/25/2024   DTaP/Tdap/Td (2 - Td or Tdap) 04/23/2027   Cervical Cancer Screening (HPV/Pap Cotest)  05/08/2028   Hepatitis C Screening  Completed   HIV Screening  Completed   HPV VACCINES  Aged Out   Meningococcal B Vaccine  Aged Out    Health Maintenance  Health Maintenance Due  Topic Date Due   Pneumococcal Vaccine 26-62 Years old (1 of 2 - PCV) Never done   COVID-19 Vaccine (1 - 2024-25 season) Never done   Health Maintenance Items Addressed: Declines vaccines  Additional Screening:  Vision Screening: Recommended annual ophthalmology exams for early detection of glaucoma and other disorders of the eye.  Dental Screening: Recommended annual dental exams for proper oral hygiene  Community Resource Referral / Chronic Care Management: CRR required this visit?  No   CCM required this visit?  No     Plan:     I have personally reviewed and noted the following in the patient's chart:   Medical and social history Use of alcohol, tobacco or illicit drugs  Current medications and supplements including opioid prescriptions. Patient is not currently taking opioid prescriptions. Functional ability and status Nutritional status Physical activity Advanced directives List of other physicians Hospitalizations, surgeries, and ER visits in previous 12 months Vitals Screenings to include cognitive, depression, and falls Referrals and appointments  In addition, I have reviewed and discussed with patient certain  preventive protocols, quality metrics, and best practice recommendations. A written personalized care plan for preventive services as well as general preventive health recommendations were provided to patient.     Areatha Beecham, LPN   02/20/1912   After Visit Summary: (MyChart) Due to this being a telephonic visit, the after visit summary with patients personalized plan was offered to patient via MyChart   Notes: Nothing significant to report at this time.

## 2023-11-26 NOTE — Patient Instructions (Signed)
 Tiffany Juarez , Thank you for taking time to come for your Medicare Wellness Visit. I appreciate your ongoing commitment to your health goals. Please review the following plan we discussed and let me know if I can assist you in the future.   Referrals/Orders/Follow-Ups/Clinician Recommendations: none  This is a list of the screening recommended for you and due dates:  Health Maintenance  Topic Date Due   Pneumococcal Vaccination (1 of 2 - PCV) Never done   COVID-19 Vaccine (1 - 2024-25 season) Never done   Flu Shot  02/21/2024   Medicare Annual Wellness Visit  11/25/2024   DTaP/Tdap/Td vaccine (2 - Td or Tdap) 04/23/2027   Pap with HPV screening  05/08/2028   Hepatitis C Screening  Completed   HIV Screening  Completed   HPV Vaccine  Aged Out   Meningitis B Vaccine  Aged Out    Advanced directives: (ACP Link)Information on Advanced Care Planning can be found at Daniels  Secretary of [facility] Advance Health Care Directives Advance Health Care Directives. [email or web address]   Next Medicare Annual Wellness Visit scheduled for next year: Yes  Have you seen your provider in the last 6 months (3 months if uncontrolled diabetes)? Yes, has appointment 03/24/2024  insert Preventive Care attachment Insert FALL PREVENTION attachment if needed

## 2023-12-04 DIAGNOSIS — F3131 Bipolar disorder, current episode depressed, mild: Secondary | ICD-10-CM | POA: Diagnosis not present

## 2023-12-05 ENCOUNTER — Ambulatory Visit: Admitting: Neurology

## 2023-12-20 DIAGNOSIS — F3131 Bipolar disorder, current episode depressed, mild: Secondary | ICD-10-CM | POA: Diagnosis not present

## 2023-12-24 ENCOUNTER — Ambulatory Visit (INDEPENDENT_AMBULATORY_CARE_PROVIDER_SITE_OTHER): Admitting: Neurology

## 2023-12-24 ENCOUNTER — Encounter: Payer: Self-pay | Admitting: Neurology

## 2023-12-24 VITALS — BP 102/72 | Ht 60.0 in | Wt 166.5 lb

## 2023-12-24 DIAGNOSIS — M797 Fibromyalgia: Secondary | ICD-10-CM | POA: Diagnosis not present

## 2023-12-24 DIAGNOSIS — G43709 Chronic migraine without aura, not intractable, without status migrainosus: Secondary | ICD-10-CM | POA: Diagnosis not present

## 2023-12-24 DIAGNOSIS — E559 Vitamin D deficiency, unspecified: Secondary | ICD-10-CM

## 2023-12-24 MED ORDER — SUMATRIPTAN SUCCINATE 50 MG PO TABS
ORAL_TABLET | ORAL | 6 refills | Status: AC
Start: 2023-12-24 — End: ?

## 2023-12-24 MED ORDER — TIZANIDINE HCL 4 MG PO TABS: 4.0000 mg | ORAL_TABLET | Freq: Four times a day (QID) | ORAL | 5 refills | Status: AC | PRN

## 2023-12-24 MED ORDER — ONDANSETRON 4 MG PO TBDP: 4.0000 mg | ORAL_TABLET | Freq: Three times a day (TID) | ORAL | 6 refills | Status: AC | PRN

## 2023-12-24 MED ORDER — AJOVY 225 MG/1.5ML ~~LOC~~ SOAJ: 225.0000 mg | SUBCUTANEOUS | 11 refills | Status: DC

## 2023-12-24 NOTE — Progress Notes (Unsigned)
 Chief Complaint  Patient presents with   New Patient (Initial Visit)    Rm 15. NX Dr. Gracie Lav and Amy-l/s 2020/internal referral for trigeminal neuralgia. Pt reports not severe pain today. Pt reports severe pain at least half the week every week. Pt reports she does not feel like the gaba pentin does not help. Pt reports this year the pain has gotten more extreme than before. Pt reports advil  does not stop pain in head     ASSESSMENT AND PLAN  Tiffany Juarez is a 43 y.o. female   Worsening headache, body achy pain History of fibromyalgia, vitamin D  deficiency  Headache has migraine features  Already on polypharmacy due to bipolar disorder, will add on ajovy monthly as preventive medications  Imitrex as needed May combine with Zofran , tizanidine Aleve for prolonged severe headaches  Laboratory evaluation CPK, vitamin D  level for her complaints of worsening body achy pain  Return To Clinic In 6 Months   DIAGNOSTIC DATA (LABS, IMAGING, TESTING) - I reviewed patient records, labs, notes, testing and imaging myself where available.   MEDICAL HISTORY:  Tiffany Juarez, seen in request by [facility] PA  Tysinger, Christiane Cowing, PA-C  History is obtained from the patient and review of electronic medical records. I personally reviewed pertinent available imaging films in PACS.   PMHx of  Chronic Insomnia Bipolar, stable dose of Latuda 80mg  daily, Buspar 15mg  tid for many years Smoke 1ppd  Stat at home  Pain all over, since 2019, x5-6 years, chronic, fibromyogia, getting worse, all over body pain, pain in her head,  more on right side shooting pain to right occipital, most days, she does have them, severe to the point, she could nto do anythigns,   Severe pain last for 24 hours, no light, noise senisitve,light bolt go over her head,   Tylenol , advil , Exdreine,   HA 3-4/a week, this has been this bad for 6 months.  2022, 4 children, getting worse, during last preganancy, subside,  came back, vision is ok Gabapentin  for headache     PHYSICAL EXAM:   Vitals:   12/24/23 1133  BP: 102/72  Weight: 166 lb 8 oz (75.5 kg)  Height: 5' (1.524 m)   Not recorded     Body mass index is 32.52 kg/m.  PHYSICAL EXAMNIATION:  Gen: NAD, conversant, well nourised, well groomed                     Cardiovascular: Regular rate rhythm, no peripheral edema, warm, nontender. Eyes: Conjunctivae clear without exudates or hemorrhage Neck: Supple, no carotid bruits. Pulmonary: Clear to auscultation bilaterally   NEUROLOGICAL EXAM:  MENTAL STATUS: Speech/cognition: Awake, alert, oriented to history taking and casual conversation CRANIAL NERVES: CN II: Visual fields are full to confrontation. Pupils are round equal and briskly reactive to light. CN III, IV, VI: extraocular movement are normal. No ptosis. CN V: Facial sensation is intact to light touch CN VII: Face is symmetric with normal eye closure  CN VIII: Hearing is normal to causal conversation. CN IX, X: Phonation is normal. CN XI: Head turning and shoulder shrug are intact  MOTOR: There is no pronator drift of out-stretched arms. Muscle bulk and tone are normal. Muscle strength is normal.  REFLEXES: Reflexes are 2+ and symmetric at the biceps, triceps, knees, and ankles. Plantar responses are flexor.  SENSORY: Intact to light touch, pinprick and vibratory sensation are intact in fingers and toes.  COORDINATION: There is no trunk or  limb dysmetria noted.  GAIT/STANCE: Posture is normal. Gait is steady with normal steps, base, arm swing, and turning. Heel and toe walking are normal. Tandem gait is normal.  Romberg is absent.  REVIEW OF SYSTEMS:  Full 14 system review of systems performed and notable only for as above All other review of systems were negative.   ALLERGIES: Allergies  Allergen Reactions   Cephalosporins Anaphylaxis   Peanut-Containing Drug Products Anaphylaxis   Penicillins Anaphylaxis     HOME MEDICATIONS: Current Outpatient Medications  Medication Sig Dispense Refill   busPIRone (BUSPAR) 15 MG tablet Take 15 mg by mouth 3 (three) times daily.     DULoxetine  (CYMBALTA ) 30 MG capsule Take 1 capsule (30 mg total) by mouth daily. (Patient taking differently: Take 60 mg by mouth daily.) 30 capsule 12   famotidine (PEPCID) 40 MG tablet Take 40 mg by mouth daily.     gabapentin  (NEURONTIN ) 100 MG capsule Take 1 capsule (100 mg total) by mouth 2 (two) times daily. 60 capsule 1   hydrOXYzine  (ATARAX ) 25 MG tablet Take 25 mg by mouth 3 times/day as needed-between meals & bedtime.     lurasidone (LATUDA) 20 MG TABS tablet Take 80 mg by mouth.     methocarbamol  (ROBAXIN -750) 750 MG tablet Take 1 tablet (750 mg total) by mouth every 6 (six) hours as needed (muscle pain). 90 tablet 2   mometasone (ELOCON) 0.1 % cream Apply topically.     Vitamin D , Ergocalciferol , (DRISDOL ) 1.25 MG (50000 UNIT) CAPS capsule TAKE 1 CAPSULE BY MOUTH EVERY 7 DAYS 12 capsule 3   temazepam (RESTORIL) 15 MG capsule Take 15 mg by mouth at bedtime as needed. As needed (Patient not taking: Reported on 12/24/2023)     No current facility-administered medications for this visit.    PAST MEDICAL HISTORY: Past Medical History:  Diagnosis Date   Alteration consciousness 05/26/2018   Anginal pain (HCC)    Angio-edema 12/03/2022   Bipolar disorder (HCC)    Chronic intractable headache 12/03/2022   Class 1 obesity with serious comorbidity and body mass index (BMI) of 31.0 to 31.9 in adult 11/27/2021   Depression    Dry skin 11/17/2019   Fibroid    Fibromyalgia    Finger numbness 02/15/2021   Multiple skin nodules 12/03/2022   Night sweats 11/17/2019   Numbness and tingling    Obesity (BMI 30-39.9) 11/17/2019   Pain in both wrists 02/15/2021   Paresthesia 10/09/2017   Peeling skin 12/03/2022   Prediabetes 11/06/2018   Pure hypercholesterolemia 11/17/2019   Smoker 11/17/2019    PAST SURGICAL  HISTORY: Past Surgical History:  Procedure Laterality Date   CESAREAN SECTION     CESAREAN SECTION N/A 01/19/2021   Procedure: CESAREAN SECTION;  Surgeon: Ozan, Jennifer, DO;  Location: [facility] LD ORS;  Service: Obstetrics;  Laterality: N/A;   wisdom teeth      FAMILY HISTORY: Family History  Problem Relation Age of Onset   Depression Father    Schizophrenia Father    Depression Brother    Healthy Mother    Hypertension Maternal Grandmother    Cancer Maternal Grandmother    Diabetes Maternal Aunt     SOCIAL HISTORY: Social History   Socioeconomic History   Marital status: Single    Spouse name: Not on file   Number of children: 3   Years of education: 12   Highest education level: High school graduate  Occupational History   Occupation: Unemployed  Tobacco Use  Smoking status: Some Days    Current packs/day: 0.50    Types: Cigarettes   Smokeless tobacco: Never  Vaping Use   Vaping status: Former   Substances: Nicotine, CBD, Flavoring  Substance and Sexual Activity   Alcohol use: No    Comment: quit 2014   Drug use: No   Sexual activity: Yes    Birth control/protection: None  Other Topics Concern   Not on file  Social History Narrative   Lives at home with family.   Right-handed.   2 cups caffeine per day.   Social Drivers of Corporate investment banker Strain: Low Risk  (11/26/2023)   Overall Financial Resource Strain (CARDIA)    Difficulty of Paying Living Expenses: Not hard at all  Food Insecurity: No Food Insecurity (11/26/2023)   Hunger Vital Sign    Worried About Running Out of Food in the Last Year: Never true    Ran Out of Food in the Last Year: Never true  Transportation Needs: No Transportation Needs (11/26/2023)   PRAPARE - Administrator, Civil Service (Medical): No    Lack of Transportation (Non-Medical): No  Physical Activity: Inactive (11/26/2023)   Exercise Vital Sign    Days of Exercise per Week: 0 days    Minutes of Exercise per  Session: 0 min  Stress: Stress Concern Present (11/26/2023)   [employer] of Occupational Health - Occupational Stress Questionnaire    Feeling of Stress : To some extent  Social Connections: Socially Isolated (11/26/2023)   Social Connection and Isolation Panel [NHANES]    Frequency of Communication with Friends and Family: Twice a week    Frequency of Social Gatherings with Friends and Family: Never    Attends Religious Services: Never    Database administrator or Organizations: No    Attends Banker Meetings: Never    Marital Status: Living with partner  Intimate Partner Violence: Not At Risk (11/26/2023)   Humiliation, Afraid, Rape, and Kick questionnaire    Fear of Current or Ex-Partner: No    Emotionally Abused: No    Physically Abused: No    Sexually Abused: No      Phebe Brasil, M.D. Ph.D.  [facility] [address] [place], [place] [address] Ph: [phone] Fax: [phone]  CC:  Claudene Crystal, PA-C [address] [place],  [place] [address]  Early, Sara E, NP

## 2023-12-24 NOTE — Patient Instructions (Addendum)
 Meds ordered this encounter  Medications   Fremanezumab-vfrm (AJOVY) 225 MG/1.5ML SOAJ    Sig: Inject 225 mg into the skin every 30 (thirty) days.    Dispense:  1.68 mL    Refill:  11  1 SUMAtriptan (IMITREX) 50 MG tablet    Sig: May repeat in 2 hours if headache persists or recurs.    Dispense:  12 tablet    Refill:  6  2 tiZANidine (ZANAFLEX) 4 MG tablet    Sig: Take 1 tablet (4 mg total) by mouth every 6 (six) hours as needed for muscle spasms.    Dispense:  30 tablet    Refill:  5  3 ondansetron  (ZOFRAN -ODT) 4 MG disintegrating tablet    Sig: Take 1 tablet (4 mg total) by mouth every 8 (eight) hours as needed for nausea or vomiting.    Dispense:  20 tablet    Refill:  6    1+2+3+4 Aleve as needed for sever prolonged headaches.

## 2023-12-25 LAB — VITAMIN D 25 HYDROXY (VIT D DEFICIENCY, FRACTURES): Vit D, 25-Hydroxy: 25.9 ng/mL — ABNORMAL LOW (ref 30.0–100.0)

## 2023-12-25 LAB — CK: Total CK: 151 U/L (ref 32–182)

## 2023-12-26 ENCOUNTER — Ambulatory Visit: Payer: Self-pay | Admitting: Neurology

## 2024-01-14 DIAGNOSIS — F3131 Bipolar disorder, current episode depressed, mild: Secondary | ICD-10-CM | POA: Diagnosis not present

## 2024-01-26 ENCOUNTER — Other Ambulatory Visit: Payer: Self-pay | Admitting: Orthopedic Surgery

## 2024-01-26 MED ORDER — HYDROCODONE-ACETAMINOPHEN 5-325 MG PO TABS: 1.0000 | ORAL_TABLET | Freq: Four times a day (QID) | ORAL | 0 refills | Status: DC | PRN

## 2024-01-27 DIAGNOSIS — G5601 Carpal tunnel syndrome, right upper limb: Secondary | ICD-10-CM | POA: Diagnosis not present

## 2024-01-29 ENCOUNTER — Telehealth: Payer: Self-pay

## 2024-01-29 NOTE — Telephone Encounter (Signed)
 Patient called stating that the Hydrocodone  is not helping with the pain and that it only helps for about .  Would like to know if she is able to take Ibuprofen  due to the inflammation and swelling in her hand.  CB# [phone].  Please advise.  Thank you

## 2024-01-29 NOTE — Telephone Encounter (Signed)
 Ok for ibu 800 q 8 and gaba 100 po tid thx 90

## 2024-01-30 ENCOUNTER — Other Ambulatory Visit: Payer: Self-pay

## 2024-01-30 MED ORDER — IBUPROFEN 800 MG PO TABS: 800.0000 mg | ORAL_TABLET | Freq: Three times a day (TID) | ORAL | 0 refills | Status: AC | PRN

## 2024-01-30 MED ORDER — GABAPENTIN 100 MG PO CAPS: 100.0000 mg | ORAL_CAPSULE | Freq: Two times a day (BID) | ORAL | 0 refills | Status: AC

## 2024-01-30 NOTE — Progress Notes (Signed)
 Medications sent to pharmacy per Dr Addie

## 2024-02-01 ENCOUNTER — Encounter: Payer: Self-pay | Admitting: Orthopedic Surgery

## 2024-02-07 ENCOUNTER — Encounter: Payer: Self-pay | Admitting: Surgical

## 2024-02-07 ENCOUNTER — Ambulatory Visit: Admitting: Surgical

## 2024-02-07 DIAGNOSIS — G5601 Carpal tunnel syndrome, right upper limb: Secondary | ICD-10-CM

## 2024-02-07 NOTE — Progress Notes (Signed)
 Post-Op Visit Note   Patient: Tiffany Juarez           Date of Birth: 06/18/1981           MRN: [id number] Visit Date: 02/07/2024 PCP: Oris Camie BRAVO, NP   Assessment & Plan:  Chief Complaint:  Chief Complaint  Patient presents with   Right Wrist - Follow-up    Right carpal tunnel release 01/27/2024   Visit Diagnoses:  1. Carpal tunnel syndrome, right upper limb     Plan: Patient is a 43 year old female who presents s/p right carpal tunnel release on 01/27/2024.  Doing okay overall.  She is progressively improving as she gets out from surgery.  No fevers or chills.  Taking hydrocodone  at night.  Also taking ibuprofen  and gabapentin .  She states that the severe numbness and the radiating pain up her arm has substantially improved since surgery.  Still has some residual numbness and tingling in the tips of her fingers especially in the index finger and thumb.  Also has some pressure pain in the thenar region but this is tolerable.  On exam, patient has intact EPL, FPL, finger abduction, grip strength.  She has incision with sutures intact.  No evidence of infection or dehiscence.  Sutures removed and replaced with Steri-Strips and after removal of sutures there was no residual superficial dehiscence of the incision.  2+ radial pulse of the operative extremity.  Plan at this time is follow-up in 3 weeks for clinical recheck.  No lifting with the operative hand.  No submersion of the operative hand.  Okay for showering.  Call with any concerns.  Follow-Up Instructions: No follow-ups on file.   Orders:  No orders of the defined types were placed in this encounter.  No orders of the defined types were placed in this encounter.   Imaging: No results found.  PMFS History: Patient Active Problem List   Diagnosis Date Noted   Chronic migraine w/o aura w/o status migrainosus, not intractable 12/24/2023   Hives of unknown origin 03/21/2023   Encounter for annual physical exam 03/21/2023    Painful swelling of joint 12/03/2022   Hair loss 12/03/2022   Mixed hyperlipidemia 11/27/2021   Bilateral carpal tunnel syndrome 02/15/2021   Fibroid 09/12/2020   Vitamin D  deficiency 11/11/2018   Prediabetes 11/06/2018   Fibromyalgia 05/26/2018   Sleep walking 05/15/2018   Bipolar disorder (HCC)    Pain of both hip joints 10/09/2017   Past Medical History:  Diagnosis Date   Alteration consciousness 05/26/2018   Anginal pain (HCC)    Angio-edema 12/03/2022   Bipolar disorder (HCC)    Chronic intractable headache 12/03/2022   Class 1 obesity with serious comorbidity and body mass index (BMI) of 31.0 to 31.9 in adult 11/27/2021   Depression    Dry skin 11/17/2019   Fibroid    Fibromyalgia    Finger numbness 02/15/2021   Multiple skin nodules 12/03/2022   Night sweats 11/17/2019   Numbness and tingling    Obesity (BMI 30-39.9) 11/17/2019   Pain in both wrists 02/15/2021   Paresthesia 10/09/2017   Peeling skin 12/03/2022   Prediabetes 11/06/2018   Pure hypercholesterolemia 11/17/2019   Smoker 11/17/2019    Family History  Problem Relation Age of Onset   Depression Father    Schizophrenia Father    Depression Brother    Healthy Mother    Hypertension Maternal Grandmother    Cancer Maternal Grandmother    Diabetes Maternal Aunt  Past Surgical History:  Procedure Laterality Date   CESAREAN SECTION     CESAREAN SECTION N/A 01/19/2021   Procedure: CESAREAN SECTION;  Surgeon: Ozan, Jennifer, DO;  Location: [facility];  Service: Obstetrics;  Laterality: N/A;   wisdom teeth     Social History   Occupational History   Occupation: Unemployed  Tobacco Use   Smoking status: Some Days    Current packs/day: 0.50    Types: Cigarettes   Smokeless tobacco: Never  Vaping Use   Vaping status: Former   Substances: Nicotine, CBD, Flavoring  Substance and Sexual Activity   Alcohol use: No    Comment: quit 2014   Drug use: No   Sexual activity: Yes    Birth  control/protection: None

## 2024-02-14 ENCOUNTER — Telehealth: Payer: Self-pay

## 2024-02-14 ENCOUNTER — Telehealth: Payer: Self-pay | Admitting: Neurology

## 2024-02-14 ENCOUNTER — Other Ambulatory Visit (HOSPITAL_COMMUNITY): Payer: Self-pay

## 2024-02-14 DIAGNOSIS — G43709 Chronic migraine without aura, not intractable, without status migrainosus: Secondary | ICD-10-CM

## 2024-02-14 NOTE — Telephone Encounter (Signed)
 Pt called stating that her insurance is not covering the Fremanezumab -vfrm (AJOVY ) 225 MG/1.5ML SOAJ  and she is wanting to know if there is a medication that can substitute it. Please advise.

## 2024-02-14 NOTE — Telephone Encounter (Signed)
 We should try PA first

## 2024-02-14 NOTE — Telephone Encounter (Signed)
 Pharmacy Patient Advocate Encounter   Received notification from Physician's Office that prior authorization for Ajovy  is required/requested.   Insurance verification completed.   The patient is insured through [facility] .   Per test claim:  Emgality, Aimovig and Nurtec is preferred by the insurance.  If suggested medication is appropriate, Please send in a new RX and discontinue this one. If not, please advise as to why it's not appropriate so that we may request a Prior Authorization. Please note, some preferred medications may still require a PA.  If the suggested medications have not been trialed and there are no contraindications to their use, the PA will not be submitted, as it will not be approved.

## 2024-02-17 MED ORDER — AIMOVIG 70 MG/ML ~~LOC~~ SOAJ: 70.0000 mg | SUBCUTANEOUS | 11 refills | Status: AC

## 2024-02-17 NOTE — Telephone Encounter (Signed)
Meds ordered this encounter  Medications   Erenumab-aooe (AIMOVIG) 70 MG/ML SOAJ    Sig: Inject 70 mg into the skin every 30 (thirty) days.    Dispense:  1.12 mL    Refill:  11     

## 2024-02-18 ENCOUNTER — Telehealth: Payer: Self-pay

## 2024-02-18 ENCOUNTER — Other Ambulatory Visit (HOSPITAL_COMMUNITY): Payer: Self-pay

## 2024-02-18 NOTE — Telephone Encounter (Signed)
 Damien from [facility] called with medication approval effective 02/18/24-02/17/25 if their are questions the call back # is [phone] option 5

## 2024-02-18 NOTE — Telephone Encounter (Signed)
 Pharmacy Patient Advocate Encounter   Received notification from Physician's Office that prior authorization for Aimovig  70MG /ML auto-injectors is required/requested.   Insurance verification completed.   The patient is insured through [facility] .   Per test claim: PA required; PA submitted to above mentioned insurance via CoverMyMeds Key/confirmation #/EOC [id number] Status is pending

## 2024-02-18 NOTE — Telephone Encounter (Signed)
 Pharmacy Patient Advocate Encounter  Received notification from [facility] that Prior Authorization for Aimovig  70MG /ML auto-injectors has been APPROVED from 02/18/2024 to 02/17/2025   PA #/Case ID/Reference #: PA Case ID #: [id number]

## 2024-02-28 ENCOUNTER — Encounter: Payer: Self-pay | Admitting: Surgical

## 2024-02-28 ENCOUNTER — Ambulatory Visit: Admitting: Surgical

## 2024-02-28 DIAGNOSIS — G5601 Carpal tunnel syndrome, right upper limb: Secondary | ICD-10-CM

## 2024-02-28 MED ORDER — MELOXICAM 15 MG PO TABS: 15.0000 mg | ORAL_TABLET | Freq: Every day | ORAL | 0 refills | Status: DC

## 2024-02-28 NOTE — Progress Notes (Signed)
 Post-Op Visit Note   Patient: Tiffany Juarez           Date of Birth: 12/09/1980           MRN: [id number] Visit Date: 02/28/2024 PCP: Oris Camie BRAVO, NP   Assessment & Plan:  Chief Complaint:  Chief Complaint  Patient presents with   Right Wrist - Routine Post Op, Follow-up    Right carpal tunnel release 01/27/2024   Visit Diagnoses:  1. Carpal tunnel syndrome, right upper limb     Plan: Patient is a 43 year old female who presents s/p right carpal tunnel release on 01/27/2024.  She states that she is having a fair amount of pain in the hypothenar eminence region as well as some generalized stiffness throughout her right hand.  She denies any numbness or tingling.  Taking ibuprofen  without much relief.  Gabapentin  without much relief.  No fevers or chills.  No drainage from the incision.  She has been keeping her hand moving but has not been doing any formal exercise program.  On exam, incision looks to be healing well without evidence of infection or dehiscence.  2+ radial pulse of the operative extremity.  Intact EPL, FPL, finger abduction.  She is able to make full composite fist and she has no triggering of the digits.  No tenderness over the A1 pulley of any of the fingers except for some minimal tenderness over the A1 pulley of the right thumb.  There is no cellulitis or skin changes noted around the hand.  She has no tenderness over the ulnar fovea or through any of the extensor compartments.  Most of her pain is reproduced with palpation to the hypothenar eminence but this is consistent with pillar pain.  Plan at this time is try consistent meloxicam  to see if this will calm down some of the inflammation causing her pain.  If no improvement, we did discuss trying formal occupational therapy 1 time per week.  In the meantime, she can try and look up some exercises for postop carpal tunnel on the Internet.  If any concerns, she will call us  but otherwise follow-up in 4 weeks.  Follow-Up  Instructions: Return in about 4 weeks (around 03/27/2024).   Orders:  No orders of the defined types were placed in this encounter.  Meds ordered this encounter  Medications   meloxicam  (MOBIC ) 15 MG tablet    Sig: Take 1 tablet (15 mg total) by mouth daily.    Dispense:  30 tablet    Refill:  0    Imaging: No results found.  PMFS History: Patient Active Problem List   Diagnosis Date Noted   Chronic migraine w/o aura w/o status migrainosus, not intractable 12/24/2023   Hives of unknown origin 03/21/2023   Encounter for annual physical exam 03/21/2023   Painful swelling of joint 12/03/2022   Hair loss 12/03/2022   Mixed hyperlipidemia 11/27/2021   Bilateral carpal tunnel syndrome 02/15/2021   Fibroid 09/12/2020   Vitamin D  deficiency 11/11/2018   Prediabetes 11/06/2018   Fibromyalgia 05/26/2018   Sleep walking 05/15/2018   Bipolar disorder (HCC)    Pain of both hip joints 10/09/2017   Past Medical History:  Diagnosis Date   Alteration consciousness 05/26/2018   Anginal pain (HCC)    Angio-edema 12/03/2022   Bipolar disorder (HCC)    Chronic intractable headache 12/03/2022   Class 1 obesity with serious comorbidity and body mass index (BMI) of 31.0 to 31.9 in adult 11/27/2021   Depression  Dry skin 11/17/2019   Fibroid    Fibromyalgia    Finger numbness 02/15/2021   Multiple skin nodules 12/03/2022   Night sweats 11/17/2019   Numbness and tingling    Obesity (BMI 30-39.9) 11/17/2019   Pain in both wrists 02/15/2021   Paresthesia 10/09/2017   Peeling skin 12/03/2022   Prediabetes 11/06/2018   Pure hypercholesterolemia 11/17/2019   Smoker 11/17/2019    Family History  Problem Relation Age of Onset   Depression Father    Schizophrenia Father    Depression Brother    Healthy Mother    Hypertension Maternal Grandmother    Cancer Maternal Grandmother    Diabetes Maternal Aunt     Past Surgical History:  Procedure Laterality Date   CESAREAN SECTION      CESAREAN SECTION N/A 01/19/2021   Procedure: CESAREAN SECTION;  Surgeon: Ozan, Jennifer, DO;  Location: [facility];  Service: Obstetrics;  Laterality: N/A;   wisdom teeth     Social History   Occupational History   Occupation: Unemployed  Tobacco Use   Smoking status: Some Days    Current packs/day: 0.50    Types: Cigarettes   Smokeless tobacco: Never  Vaping Use   Vaping status: Former   Substances: Nicotine, CBD, Flavoring  Substance and Sexual Activity   Alcohol use: No    Comment: quit 2014   Drug use: No   Sexual activity: Yes    Birth control/protection: None

## 2024-03-24 ENCOUNTER — Encounter: Payer: Self-pay | Admitting: Nurse Practitioner

## 2024-03-24 ENCOUNTER — Ambulatory Visit (INDEPENDENT_AMBULATORY_CARE_PROVIDER_SITE_OTHER): Payer: Medicare Other | Admitting: Nurse Practitioner

## 2024-03-24 VITALS — BP 122/78 | HR 74 | Ht 60.0 in | Wt 168.8 lb

## 2024-03-24 DIAGNOSIS — R1031 Right lower quadrant pain: Secondary | ICD-10-CM

## 2024-03-24 DIAGNOSIS — N3941 Urge incontinence: Secondary | ICD-10-CM

## 2024-03-24 DIAGNOSIS — L299 Pruritus, unspecified: Secondary | ICD-10-CM

## 2024-03-24 DIAGNOSIS — Z0000 Encounter for general adult medical examination without abnormal findings: Secondary | ICD-10-CM | POA: Diagnosis not present

## 2024-03-24 DIAGNOSIS — Z130 Encounter for screening for diseases of the blood and blood-forming organs and certain disorders involving the immune mechanism: Secondary | ICD-10-CM

## 2024-03-24 DIAGNOSIS — R7303 Prediabetes: Secondary | ICD-10-CM | POA: Diagnosis not present

## 2024-03-24 DIAGNOSIS — R5382 Chronic fatigue, unspecified: Secondary | ICD-10-CM | POA: Diagnosis not present

## 2024-03-24 DIAGNOSIS — E559 Vitamin D deficiency, unspecified: Secondary | ICD-10-CM

## 2024-03-24 DIAGNOSIS — Z1329 Encounter for screening for other suspected endocrine disorder: Secondary | ICD-10-CM

## 2024-03-24 DIAGNOSIS — F319 Bipolar disorder, unspecified: Secondary | ICD-10-CM

## 2024-03-24 DIAGNOSIS — E782 Mixed hyperlipidemia: Secondary | ICD-10-CM

## 2024-03-24 DIAGNOSIS — R140 Abdominal distension (gaseous): Secondary | ICD-10-CM

## 2024-03-24 DIAGNOSIS — R0683 Snoring: Secondary | ICD-10-CM

## 2024-03-24 LAB — LIPID PANEL

## 2024-03-24 MED ORDER — TRIAMCINOLONE ACETONIDE 0.1 % EX CREA: 1.0000 | TOPICAL_CREAM | Freq: Two times a day (BID) | CUTANEOUS | 0 refills | Status: AC

## 2024-03-24 NOTE — Assessment & Plan Note (Signed)
Historic diagnosis. Will monitor labs. Not currently on medication. Recommend diet and exercise management.

## 2024-03-24 NOTE — Patient Instructions (Addendum)
 Health Maintenance Recommendations  Our records show that you are due for the following vaccines and screenings: Zoster Vaccines- Shingrix (2 of 2) This is a 2 shot series recommended for all adults over age 42 to protect against shingles.  Pneumococcal Vaccine: 50+ Years (2 of 2 - PCV) This is one time shot recommended for all adults over 50 to protect against 20 serious bacterial infections that can cause pneumonia.  INFLUENZA VACCINE  This is recommended each year to protect against the flu. I recommend completing this by the end of October for best protection.  Hepatitis B Vaccines 19-59 Average Risk (1 of 3 - 19+ 3-dose series) This is a 3 shot series recommended to all adults to protect against hepatitis B. Hepatitis B affects the liver and can lead to serious liver complications.   If you have had any of these completed elsewhere, please let us  know where you had these done and when so we can update your records.   You can come to have these done here in the office or at the pharmacy if this is more convenient.   We will check labs today to see if there are any findings that may help explain the fatigue and other symptoms you are experiencing. This may be part of the fibromyalgia, but we will help rule things out.   I have ordered an ultrasound for the pelvis to monitor for an ovarian cyst. I will let you know if there are any concerning findings.   I put information on pelvic floor dysfunction on the back of this paperwork for you to review. It does appear that you have a weakened pelvic floor, which can cause the urinary urgency you are experiencing. I have sent a referral to the UroGynecologist for evaluation.   If the labs are all normal, I do recommend we get a sleep study to make sure this is not something that is causing the fatigue.   I have sent in a cream for you to try in your ear to help with the itching and flaking.

## 2024-03-24 NOTE — Assessment & Plan Note (Signed)
 Historically elevated. Recommendation for statin therapy in addition to diet and exercise with last labs. Will recheck today for evaluation and management.

## 2024-03-24 NOTE — Assessment & Plan Note (Signed)
 Etiology unknown. Patient also mentions RLQ pain for the past few weeks. No abnormalities noted on exam. Will obtain ultrasound for further evaluation.

## 2024-03-24 NOTE — Assessment & Plan Note (Addendum)
 Chronic fatigue with symptoms resembling a cold, including nasal congestion, ear discomfort, and head pressure. Fatigue worsened by Nyquil and levocetirizine. Possible sleep apnea suggested due to snoring and daytime fatigue. Unclear if relation to fibromyalgia is also present. Several sedating medications on med list, but patient does not feel that this is a causation due to timing.  - Order labs to check for anemia and thyroid  function - Consider home sleep study if labs are inconclusive

## 2024-03-24 NOTE — Assessment & Plan Note (Signed)
 Well controlled at this time with psychiatry. No concerns.

## 2024-03-24 NOTE — Assessment & Plan Note (Signed)
 Consider sleep study if labs are not clear on cause for fatigue.

## 2024-03-24 NOTE — Assessment & Plan Note (Signed)
 Intermittent abdominal and pelvic pain, wrapping from the front to the back, possibly related to ovarian cyst or fibroid. Unlikely appendix relation based on symptoms and descriptor. Symptoms have been present for a few weeks and are bothersome. Discussed possibility of ovarian cyst resolving on its own. - Order pelvic ultrasound to evaluate for ovarian cyst or fibroid

## 2024-03-24 NOTE — Assessment & Plan Note (Signed)
 Urge incontinence with possible pelvic organ prolapse, including urgency and occasional near incontinence, worsened after last pregnancy. Possible bladder prolapse noted. Declined pelvic floor physical therapy due to fibromyalgia-related pain. - Refer to urogynecology for further evaluation

## 2024-03-24 NOTE — Assessment & Plan Note (Signed)
 Intermittent pruritus and scabbing at the opening of the ear, possibly due to eczema. Symptoms have been present for years, with recent exacerbation. - Prescribe topical cream for ear pruritus, apply with Q-tip twice daily

## 2024-03-24 NOTE — Progress Notes (Signed)
 Catheline Doing, DNP, AGNP-c [facility] [address] [place], [place] [address] Main Office [phone] VISIT TYPE: CPE on 03/24/2024 Today's Vitals   03/24/24 0945  BP: 122/78  Pulse: 74  Weight: 168 lb 12.8 oz (76.6 kg)  Height: 5' (1.524 m)   Body mass index is 32.97 kg/m. BP 122/78   Pulse 74   Ht 5' (1.524 m)   Wt 168 lb 12.8 oz (76.6 kg)   LMP 03/21/2024   BMI 32.97 kg/m   Subjective:    Patient ID: Tiffany Juarez, female    DOB: [birth year], 43 y.o.   MRN: [id number]  HPI: History of Present Illness Tiffany Juarez is a 43 year old female who presents for her annual exam.    She also has several other concerns to discuss today.   She experiences persistent fatigue and sensations of impending cold without actual illness. Symptoms include nasal congestion, ear discomfort, and head pressure, accompanied by extreme exhaustion requiring her to lie down. These episodes have been occurring intermittently for a couple of years but have become more bothersome due to the associated fatigue. She takes Nyquil to alleviate symptoms, which results in excessive drowsiness the following day. She occasionally uses levocetirizine for hives, but it causes significant sedation, impacting her ability to care for her toddler. She is currently on Latuda 80 mg and Aimovig  for migraines, and uses hydroxyzine  sparingly for sleep issues. No recent illness in her household.  She has a history of fibromyalgia, causing widespread pain and impacting her daily activities. She describes specific pain starting in her abdomen and wrapping around to her back, occurring daily for the past few weeks. She wonders if it could be related to her fibroids, as she has experienced similar issues before.  She reports a history of pelvic floor weakness and urge incontinence, which worsened after her last pregnancy. She describes a sensation of urgency and occasional near incontinence, along with a protrusion  in her vaginal area, which she suspects might be related to her bladder.  She has a history of itchy ears with scabbing, attributed to a possible nervous habit. This issue has been recurring for years, with a recent flare-up in the past six months. No deep ear pain but experiences itching at the opening of the ear.  She reports poor sleep quality, frequent snoring, and waking up coughing, which has been occurring for the past few weeks. She suspects she might have sleep apnea, as she often feels fatigued during the day.  Pertinent items are noted in HPI.  Most Recent Depression Screen:     03/24/2024    9:44 AM 11/26/2023   10:17 AM 05/09/2023   10:18 AM 03/21/2023    9:12 AM 11/20/2022   10:31 AM  Depression screen PHQ 2/9  Decreased Interest 0 0 0 0 1  Down, Depressed, Hopeless 1 1 0 0 3  PHQ - 2 Score 1 1 0 0 4  Altered sleeping  2 2  2   Tired, decreased energy  3 2  2   Change in appetite  0 2  3  Feeling bad or failure about yourself   2 0  0  Trouble concentrating  3 2  0  Moving slowly or fidgety/restless  0 0  0  Suicidal thoughts  0 0  0  PHQ-9 Score  11 8  11   Difficult doing work/chores  Somewhat difficult   Somewhat difficult   Most Recent Anxiety Screen:     05/09/2023  10:18 AM 11/13/2017   11:04 AM 06/17/2017   10:08 AM 04/22/2017    9:36 AM  GAD 7 : Generalized Anxiety Score  Nervous, Anxious, on Edge 3 0 3 2  Control/stop worrying 1 0 3 0  Worry too much - different things 2 0 3 1  Trouble relaxing 2 1 3 3   Restless 3 1 1 3   Easily annoyed or irritable 3 1 3 1   Afraid - awful might happen 0 0 3 1  Total GAD 7 Score 14 3 19 11    Most Recent Fall Screen:    03/24/2024    9:44 AM 11/26/2023   10:17 AM 03/21/2023    9:11 AM 11/20/2022   10:30 AM 11/27/2021   10:01 AM  Fall Risk   Falls in the past year? 0 0 0 0 0  Number falls in past yr: 0 0 0 0 0  Injury with Fall? 0 0 0 0 0  Risk for fall due to : No Fall Risks Medication side effect No Fall Risks Medication  side effect No Fall Risks  Follow up Falls evaluation completed Falls prevention discussed;Falls evaluation completed Falls evaluation completed Falls prevention discussed;Education provided;Falls evaluation completed Falls evaluation completed      Data saved with a previous flowsheet row definition    Past medical history, surgical history, medications, allergies, family history and social history reviewed with patient today and changes made to appropriate areas of the chart.  Past Medical History:  Past Medical History:  Diagnosis Date   Alteration consciousness 05/26/2018   Anginal pain (HCC)    Angio-edema 12/03/2022   Bipolar disorder (HCC)    Chronic intractable headache 12/03/2022   Class 1 obesity with serious comorbidity and body mass index (BMI) of 31.0 to 31.9 in adult 11/27/2021   Depression    Dry skin 11/17/2019   Fibroid    Fibromyalgia    Finger numbness 02/15/2021   Multiple skin nodules 12/03/2022   Night sweats 11/17/2019   Numbness and tingling    Obesity (BMI 30-39.9) 11/17/2019   Pain in both wrists 02/15/2021   Paresthesia 10/09/2017   Peeling skin 12/03/2022   Prediabetes 11/06/2018   Pure hypercholesterolemia 11/17/2019   Smoker 11/17/2019   Medications:  Current Outpatient Medications on File Prior to Visit  Medication Sig   busPIRone (BUSPAR) 15 MG tablet Take 15 mg by mouth 3 (three) times daily.   DULoxetine  (CYMBALTA ) 60 MG capsule Take 60 mg by mouth daily.   Erenumab -aooe (AIMOVIG ) 70 MG/ML SOAJ Inject 70 mg into the skin every 30 (thirty) days.   famotidine (PEPCID) 40 MG tablet Take 40 mg by mouth daily.   gabapentin  (NEURONTIN ) 100 MG capsule Take 1 capsule (100 mg total) by mouth 2 (two) times daily.   hydrOXYzine  (ATARAX ) 25 MG tablet Take 25 mg by mouth 3 times/day as needed-between meals & bedtime.   ibuprofen  (ADVIL ) 800 MG tablet Take 1 tablet (800 mg total) by mouth every 8 (eight) hours as needed.   meloxicam  (MOBIC ) 15 MG tablet  Take 1 tablet (15 mg total) by mouth daily.   methocarbamol  (ROBAXIN -750) 750 MG tablet Take 1 tablet (750 mg total) by mouth every 6 (six) hours as needed (muscle pain).   ondansetron  (ZOFRAN -ODT) 4 MG disintegrating tablet Take 1 tablet (4 mg total) by mouth every 8 (eight) hours as needed for nausea or vomiting.   SUMAtriptan  (IMITREX ) 50 MG tablet May repeat in 2 hours if headache persists or recurs.  temazepam (RESTORIL) 15 MG capsule Take 15 mg by mouth at bedtime as needed. As needed   tiZANidine  (ZANAFLEX ) 4 MG tablet Take 1 tablet (4 mg total) by mouth every 6 (six) hours as needed for muscle spasms.   Vitamin D , Ergocalciferol , (DRISDOL ) 1.25 MG (50000 UNIT) CAPS capsule TAKE 1 CAPSULE BY MOUTH EVERY 7 DAYS   lurasidone (LATUDA) 80 MG TABS tablet Take 80 mg by mouth daily.   mometasone (ELOCON) 0.1 % cream Apply topically. (Patient not taking: Reported on 03/24/2024)   No current facility-administered medications on file prior to visit.   Surgical History:  Past Surgical History:  Procedure Laterality Date   CESAREAN SECTION     CESAREAN SECTION N/A 01/19/2021   Procedure: CESAREAN SECTION;  Surgeon: Ozan, Jennifer, DO;  Location: [facility];  Service: Obstetrics;  Laterality: N/A;   wisdom teeth     Allergies:  Allergies  Allergen Reactions   Cephalosporins Anaphylaxis   Peanut-Containing Drug Products Anaphylaxis   Penicillins Anaphylaxis   Family History:  Family History  Problem Relation Age of Onset   Depression Father    Schizophrenia Father    Depression Brother    Healthy Mother    Hypertension Maternal Grandmother    Cancer Maternal Grandmother    Diabetes Maternal Aunt        Objective:    BP 122/78   Pulse 74   Ht 5' (1.524 m)   Wt 168 lb 12.8 oz (76.6 kg)   LMP 03/21/2024   BMI 32.97 kg/m   Wt Readings from Last 3 Encounters:  03/24/24 168 lb 12.8 oz (76.6 kg)  12/24/23 166 lb 8 oz (75.5 kg)  10/02/23 164 lb 9.6 oz (74.7 kg)    Physical  Exam Vitals and nursing note reviewed.  Constitutional:      General: She is not in acute distress.    Appearance: Normal appearance.  HENT:     Head: Normocephalic.     Right Ear: Tympanic membrane normal.     Left Ear: Tympanic membrane normal.     Nose: Nose normal.     Mouth/Throat:     Mouth: Mucous membranes are moist.     Pharynx: Oropharynx is clear.  Eyes:     Pupils: Pupils are equal, round, and reactive to light.  Neck:     Vascular: No carotid bruit.  Cardiovascular:     Rate and Rhythm: Normal rate and regular rhythm.     Pulses: Normal pulses.     Heart sounds: Normal heart sounds.  Pulmonary:     Effort: Pulmonary effort is normal.     Breath sounds: Normal breath sounds.  Abdominal:     General: Bowel sounds are normal. There is no distension.     Palpations: Abdomen is soft. There is no mass.     Tenderness: There is no abdominal tenderness. There is no right CVA tenderness, left CVA tenderness, guarding or rebound.  Musculoskeletal:     Cervical back: Neck supple. No tenderness.     Right lower leg: No edema.     Left lower leg: No edema.  Lymphadenopathy:     Cervical: No cervical adenopathy.  Skin:    General: Skin is warm and dry.     Capillary Refill: Capillary refill takes less than 2 seconds.  Neurological:     General: No focal deficit present.     Mental Status: She is alert and oriented to person, place, and time.  Psychiatric:  Mood and Affect: Mood normal.        Behavior: Behavior normal.      Results for orders placed or performed in visit on 12/24/23  CK   Collection Time: 12/24/23 12:34 PM  Result Value Ref Range   Total CK 151 32 - 182 U/L  Vitamin D , 25-hydroxy   Collection Time: 12/24/23 12:34 PM  Result Value Ref Range   Vit D, 25-Hydroxy 25.9 (L) 30.0 - 100.0 ng/mL       Assessment & Plan:   Problem List Items Addressed This Visit     Bipolar disorder (HCC)   Well controlled at this time with psychiatry. No  concerns.       Prediabetes   Historic diagnosis. Will monitor labs. Not currently on medication. Recommend diet and exercise management.       Relevant Orders   CBC with Differential/Platelet   CMP14+EGFR   Hemoglobin A1c   Mixed hyperlipidemia   Historically elevated. Recommendation for statin therapy in addition to diet and exercise with last labs. Will recheck today for evaluation and management.       Relevant Orders   CBC with Differential/Platelet   CMP14+EGFR   Lipid panel   Encounter for annual physical exam - Primary   CPE completed today. Review of HM activities and recommendations discussed and provided on AVS. Anticipatory guidance, diet, and exercise recommendations provided. Medications, allergies, and hx reviewed and updated as necessary. Orders placed as listed below.  Plan: - Labs ordered. Will make changes as necessary based on results.  - I will review these results and send recommendations via MyChart or a telephone call.  - F/U with CPE in 1 year or sooner for acute/chronic health needs as directed.        Chronic fatigue   Chronic fatigue with symptoms resembling a cold, including nasal congestion, ear discomfort, and head pressure. Fatigue worsened by Nyquil and levocetirizine. Possible sleep apnea suggested due to snoring and daytime fatigue. Unclear if relation to fibromyalgia is also present. Several sedating medications on med list, but patient does not feel that this is a causation due to timing.  - Order labs to check for anemia and thyroid  function - Consider home sleep study if labs are inconclusive       Relevant Orders   CBC with Differential/Platelet   CMP14+EGFR   TSH   Iron , TIBC and Ferritin Panel   T4, free   Vitamin B12   Generalized bloating   Etiology unknown. Patient also mentions RLQ pain for the past few weeks. No abnormalities noted on exam. Will obtain ultrasound for further evaluation.       Relevant Orders   US  PELVIC  COMPLETE WITH TRANSVAGINAL   Urge incontinence of urine   Urge incontinence with possible pelvic organ prolapse, including urgency and occasional near incontinence, worsened after last pregnancy. Possible bladder prolapse noted. Declined pelvic floor physical therapy due to fibromyalgia-related pain. - Refer to urogynecology for further evaluation      Relevant Orders   US  PELVIC COMPLETE WITH TRANSVAGINAL   Ambulatory referral to Urogynecology   Ear itching   Intermittent pruritus and scabbing at the opening of the ear, possibly due to eczema. Symptoms have been present for years, with recent exacerbation. - Prescribe topical cream for ear pruritus, apply with Q-tip twice daily      Relevant Medications   triamcinolone  cream (KENALOG ) 0.1 %   Abdominal pain, RLQ   Intermittent abdominal and pelvic pain, wrapping from  the front to the back, possibly related to ovarian cyst or fibroid. Unlikely appendix relation based on symptoms and descriptor. Symptoms have been present for a few weeks and are bothersome. Discussed possibility of ovarian cyst resolving on its own. - Order pelvic ultrasound to evaluate for ovarian cyst or fibroid      Relevant Orders   US  PELVIC COMPLETE WITH TRANSVAGINAL   Snoring   Consider sleep study if labs are not clear on cause for fatigue.       Vitamin D  deficiency   Relevant Orders   CBC with Differential/Platelet   CMP14+EGFR   VITAMIN D  25 Hydroxy (Vit-D Deficiency, Fractures)   Other Visit Diagnoses       Screening for endocrine, nutritional, metabolic and immunity disorder       Relevant Orders   CBC with Differential/Platelet   CMP14+EGFR   Hemoglobin A1c   Lipid panel   TSH   Iron , TIBC and Ferritin Panel   T4, free   Vitamin B12   VITAMIN D  25 Hydroxy (Vit-D Deficiency, Fractures)       Follow up plan: Return in about 1 year (around 03/24/2025) for CPE.  NEXT PREVENTATIVE PHYSICAL DUE IN 1 YEAR.  PATIENT COUNSELING PROVIDED FOR  ALL ADULT PATIENTS: A well balanced diet low in saturated fats, cholesterol, and moderation in carbohydrates.  This can be as simple as monitoring portion sizes and cutting back on sugary beverages such as soda and juice to start with.    Daily water  consumption of at least 64 ounces.  Physical activity at least 180 minutes per week.  If just starting out, start 10 minutes a day and work your way up.   This can be as simple as taking the stairs instead of the elevator and walking 2-3 laps around the office  purposefully every day.   STD protection, partner selection, and regular testing if high risk.  Limited consumption of alcoholic beverages if alcohol is consumed. For men, I recommend no more than 14 alcoholic beverages per week, spread out throughout the week (max 2 per day). Avoid binge drinking or consuming large quantities of alcohol in one setting.  Please let me know if you feel you may need help with reduction or quitting alcohol consumption.   Avoidance of nicotine, if used. Please let me know if you feel you may need help with reduction or quitting nicotine use.   Daily mental health attention. This can be in the form of 5 minute daily meditation, prayer, journaling, yoga, reflection, etc.  Purposeful attention to your emotions and mental state can significantly improve your overall wellbeing  and  Health.  Please know that I am here to help you with all of your health care goals and am happy to work with you to find a solution that works best for you.  The greatest advice I have received with any changes in life are to take it one step at a time, that even means if all you can focus on is the next 60 seconds, then do that and celebrate your victories.  With any changes in life, you will have set backs, and that is OK. The important thing to remember is, if you have a set back, it is not a failure, it is an opportunity to try again! Screening Testing Mammogram Every 1 -2  years based on history and risk factors Starting at age 13 Pap Smear Ages 21-39 every 3 years Ages 62-65 every 5 years with HPV testing  More frequent testing may be required based on results and history Colon Cancer Screening Every 1-10 years based on test performed, risk factors, and history Starting at age 76 Bone Density Screening Every 2-10 years based on history Starting at age 85 for women Recommendations for men differ based on medication usage, history, and risk factors AAA Screening One time ultrasound Men 60-68 years old who have every smoked Lung Cancer Screening Low Dose Lung CT every 12 months Age 14-80 years with a 30 pack-year smoking history who still smoke or who have quit within the last 15 years   Screening Labs Routine  Labs: Complete Blood Count (CBC), Complete Metabolic Panel (CMP), Cholesterol (Lipid Panel) Every 6-12 months based on history and medications May be recommended more frequently based on current conditions or previous results Hemoglobin A1c Lab Every 3-12 months based on history and previous results Starting at age 19 or earlier with diagnosis of diabetes, high cholesterol, BMI >26, and/or risk factors Frequent monitoring for patients with diabetes to ensure blood sugar control Thyroid  Panel (TSH) Every 6 months based on history, symptoms, and risk factors May be repeated more often if on medication HIV One time testing for all patients 69 and older May be repeated more frequently for patients with increased risk factors or exposure Hepatitis C One time testing for all patients 1 and older May be repeated more frequently for patients with increased risk factors or exposure Gonorrhea, Chlamydia Every 12 months for all sexually active persons 13-24 years Additional monitoring may be recommended for those who are considered high risk or who have symptoms Every 12 months for any woman on birth control, regardless of sexual activity PSA Men  67-56 years old with risk factors Additional screening may be recommended from age 18-69 based on risk factors, symptoms, and history  Vaccine Recommendations Tetanus Booster All adults every 10 years Flu Vaccine All patients 6 months and older every year COVID Vaccine All patients 12 years and older Initial dosing with booster May recommend additional booster based on age and health history HPV Vaccine 2 doses all patients age 23-26 Dosing may be considered for patients over 26 Shingles Vaccine (Shingrix) 2 doses all adults 55 years and older Pneumonia (Pneumovax 29) All adults 65 years and older May recommend earlier dosing based on health history One year apart from Prevnar 67 Pneumonia (Prevnar 41) All adults 65 years and older Dosed 1 year after Pneumovax 23 Pneumonia (Prevnar 20) One time alternative to the two dosing of 13 and 23 For all adults with initial dose of 23, 20 is recommended 1 year later For all adults with initial dose of 13, 23 is still recommended as second option 1 year later

## 2024-03-24 NOTE — Assessment & Plan Note (Signed)

## 2024-03-25 ENCOUNTER — Other Ambulatory Visit: Payer: Self-pay | Admitting: Nurse Practitioner

## 2024-03-25 DIAGNOSIS — M2540 Effusion, unspecified joint: Secondary | ICD-10-CM

## 2024-03-25 LAB — CMP14+EGFR
ALT: 11 IU/L (ref 0–32)
AST: 13 IU/L (ref 0–40)
Albumin: 4.2 g/dL (ref 3.9–4.9)
Alkaline Phosphatase: 72 IU/L (ref 44–121)
BUN/Creatinine Ratio: 12 (ref 9–23)
BUN: 11 mg/dL (ref 6–24)
Bilirubin Total: <0.2 mg/dL (ref 0.0–1.2)
CO2: 19 mmol/L — ABNORMAL LOW (ref 20–29)
Calcium: 9.4 mg/dL (ref 8.7–10.2)
Chloride: 102 mmol/L (ref 96–106)
Creatinine, Ser: 0.92 mg/dL (ref 0.57–1.00)
Globulin, Total: 1.9 g/dL (ref 1.5–4.5)
Glucose: 86 mg/dL (ref 70–99)
Potassium: 4.8 mmol/L (ref 3.5–5.2)
Sodium: 139 mmol/L (ref 134–144)
Total Protein: 6.1 g/dL (ref 6.0–8.5)
eGFR: 80 mL/min/1.73 (ref >59)

## 2024-03-25 LAB — VITAMIN D 25 HYDROXY (VIT D DEFICIENCY, FRACTURES): Vit D, 25-Hydroxy: 26.1 ng/mL — AB (ref 30.0–100.0)

## 2024-03-25 LAB — CBC WITH DIFFERENTIAL/PLATELET
Basophils Absolute: 0 x10E3/uL (ref 0.0–0.2)
Basos: 0 %
EOS (ABSOLUTE): 0.4 x10E3/uL (ref 0.0–0.4)
Eos: 8 %
Hematocrit: 42.5 % (ref 34.0–46.6)
Hemoglobin: 13.3 g/dL (ref 11.1–15.9)
Immature Grans (Abs): 0 x10E3/uL (ref 0.0–0.1)
Immature Granulocytes: 0 %
Lymphocytes Absolute: 2.2 x10E3/uL (ref 0.7–3.1)
Lymphs: 41 %
MCH: 26.8 pg (ref 26.6–33.0)
MCHC: 31.3 g/dL — ABNORMAL LOW (ref 31.5–35.7)
MCV: 86 fL (ref 79–97)
Monocytes Absolute: 0.3 x10E3/uL (ref 0.1–0.9)
Monocytes: 6 %
Neutrophils Absolute: 2.5 x10E3/uL (ref 1.4–7.0)
Neutrophils: 45 %
Platelets: 274 x10E3/uL (ref 150–450)
RBC: 4.97 x10E6/uL (ref 3.77–5.28)
RDW: 14.5 % (ref 11.7–15.4)
WBC: 5.5 x10E3/uL (ref 3.4–10.8)

## 2024-03-25 LAB — HEMOGLOBIN A1C
Est. average glucose Bld gHb Est-mCnc: 123 mg/dL
Hgb A1c MFr Bld: 5.9 % — ABNORMAL HIGH (ref 4.8–5.6)

## 2024-03-25 LAB — IRON,TIBC AND FERRITIN PANEL
Ferritin: 41 ng/mL (ref 15–150)
Iron Saturation: 17 (ref 15–55)
Iron: 48 ug/dL (ref 27–159)
Total Iron Binding Capacity: 290 ug/dL (ref 250–450)
UIBC: 242 ug/dL (ref 131–425)

## 2024-03-25 LAB — LIPID PANEL
Cholesterol, Total: 206 mg/dL — AB (ref 100–199)
HDL: 54 mg/dL (ref >39)
LDL CALC COMMENT:: 3.8 ratio (ref 0.0–4.4)
LDL Chol Calc (NIH): 137 mg/dL — AB (ref 0–99)
Triglycerides: 81 mg/dL (ref 0–149)
VLDL Cholesterol Cal: 15 mg/dL (ref 5–40)

## 2024-03-25 LAB — TSH: TSH: 0.89 u[IU]/mL (ref 0.450–4.500)

## 2024-03-25 LAB — VITAMIN B12: Vitamin B-12: 542 pg/mL (ref 232–1245)

## 2024-03-25 LAB — T4, FREE: Free T4: 1.23 ng/dL (ref 0.82–1.77)

## 2024-03-25 NOTE — Telephone Encounter (Signed)
 Looks like mg was change to 60 mg not 30 anymore.

## 2024-03-30 DIAGNOSIS — J300 Vasomotor rhinitis: Secondary | ICD-10-CM | POA: Diagnosis not present

## 2024-03-30 DIAGNOSIS — L501 Idiopathic urticaria: Secondary | ICD-10-CM | POA: Diagnosis not present

## 2024-03-30 DIAGNOSIS — F17200 Nicotine dependence, unspecified, uncomplicated: Secondary | ICD-10-CM | POA: Diagnosis not present

## 2024-04-01 ENCOUNTER — Ambulatory Visit: Payer: Self-pay | Admitting: Nurse Practitioner

## 2024-04-01 ENCOUNTER — Ambulatory Visit
Admission: RE | Admit: 2024-04-01 | Discharge: 2024-04-01 | Disposition: A | Discharge status: Home / Self Care | Source: Ambulatory Visit | Attending: Nurse Practitioner | Admitting: Nurse Practitioner

## 2024-04-01 DIAGNOSIS — R0683 Snoring: Secondary | ICD-10-CM

## 2024-04-01 DIAGNOSIS — R5382 Chronic fatigue, unspecified: Secondary | ICD-10-CM

## 2024-04-01 DIAGNOSIS — R140 Abdominal distension (gaseous): Secondary | ICD-10-CM

## 2024-04-01 DIAGNOSIS — N3941 Urge incontinence: Secondary | ICD-10-CM

## 2024-04-01 DIAGNOSIS — R1031 Right lower quadrant pain: Secondary | ICD-10-CM

## 2024-04-01 DIAGNOSIS — F513 Sleepwalking [somnambulism]: Secondary | ICD-10-CM

## 2024-04-03 ENCOUNTER — Ambulatory Visit (INDEPENDENT_AMBULATORY_CARE_PROVIDER_SITE_OTHER): Admitting: Surgical

## 2024-04-03 DIAGNOSIS — G5601 Carpal tunnel syndrome, right upper limb: Secondary | ICD-10-CM | POA: Diagnosis not present

## 2024-04-03 DIAGNOSIS — M7532 Calcific tendinitis of left shoulder: Secondary | ICD-10-CM

## 2024-04-05 ENCOUNTER — Encounter: Payer: Self-pay | Admitting: Surgical

## 2024-04-05 MED ORDER — BUPIVACAINE HCL 0.5 % IJ SOLN
9.0000 mL | INTRAMUSCULAR | Status: AC | PRN
  Administered 2024-04-03: 9 mL via INTRA_ARTICULAR

## 2024-04-05 MED ORDER — METHYLPREDNISOLONE ACETATE 40 MG/ML IJ SUSP
40.0000 mg | INTRAMUSCULAR | Status: AC | PRN
  Administered 2024-04-03: 40 mg via INTRA_ARTICULAR

## 2024-04-05 MED ORDER — LIDOCAINE HCL 1 % IJ SOLN
5.0000 mL | INTRAMUSCULAR | Status: AC | PRN
  Administered 2024-04-03: 5 mL

## 2024-04-05 NOTE — Progress Notes (Signed)
 Post-Op Visit Note   Patient: Tiffany Juarez           Date of Birth: [birth year]           MRN: [id number] Visit Date: 04/03/2024 PCP: Oris Camie BRAVO, NP   Assessment & Plan:  Chief Complaint:  Chief Complaint  Patient presents with   Right Hand - Routine Post Op   Visit Diagnoses:  1. Carpal tunnel syndrome, right upper limb   2. Calcific tendonitis of left shoulder     Plan: Patient is a 43 year old female who presents s/p right carpal tunnel release on 01/27/2024.  She states that the stiffness is gone and she has no numbness or tingling any longer.  Overall she is trending in the right direction.  Still notes a fair amount of soreness toward the proximal aspect of her incision but this too is improving and about 35% improved compared with the last visit.  On exam, patient has intact EPL, FPL, finger abduction, grip strength equivalent to the contralateral side.  2+ radial pulse of the operative extremity.  Able to make full composite fist.  Does have some tenderness in the operative hand consistent with pillar pain.  Plan at this time is follow-up with the office as needed.  Counseled patient to expect this soreness to slowly improve over the next 2 months or so.  If she is still having significant pain around 4 months postop, recommend she return for reevaluation.  She also complains of left shoulder pain.  Has history of left shoulder subacromial bursitis and calcific tendinitis noted on radiographs several months ago.  She had subacromial injection with Dr. Addie on 11/20/2023 that gave her great relief that has now just worn off in the last few weeks.  Is getting back to the point where she has difficulty lifting her arm due to pain.  Pain will keep her up at night but not wake her up from sleep.  No mechanical symptoms.  On exam, patient has right shoulder with 70 degrees X rotation, 120 degrees abduction, 180 degrees forward elevation passively and actively.  This compared with the left  shoulder with 50 degrees X rotation, 90 degrees abduction, 175 degrees forward elevation passively and actively.  Probably could get more passive motion out of her but she resists due to pain.  She has intact rotator cuff strength of supra, infra, subscap with strength rated 5/5.  Infra and supra strength testing do reproduce her pain.  No coarseness or crepitus noted passive motion of the shoulder.  Positive Neer and Hawkins impingement signs.  2+ radial pulse of the left upper extremity.  Discussed options available to patient.  She would like to try 1 more subacromial injection.  If this fails to resolve her symptoms, next step would be MRI arthrogram of the left shoulder versus trying glenohumeral injection for the possibility of early adhesive capsulitis.  She denies any significant history of diabetes though she is prediabetic.  No thyroid  disease.  Subacromial injection administered and patient tolerated procedure well without complication.  Follow-up as needed if symptoms do not improve.    Procedure Note  Patient: Tiffany Juarez             Date of Birth: [birth year]           MRN: [id number]             Visit Date: 04/03/2024  Procedures: Visit Diagnoses:  1. Carpal tunnel syndrome, right upper limb   2. Calcific  tendonitis of left shoulder     Large Joint Inj: L subacromial bursa on 04/03/2024 9:59 AM Indications: diagnostic evaluation and pain Details: 18 G 1.5 in needle, posterior approach  Arthrogram: No  Medications: 9 mL bupivacaine  0.5 %; 40 mg methylPREDNISolone  acetate 40 MG/ML; 5 mL lidocaine  1 % Outcome: tolerated well, no immediate complications Procedure, treatment alternatives, risks and benefits explained, specific risks discussed. Consent was given by the patient. Immediately prior to procedure a time out was called to verify the correct patient, procedure, equipment, support staff and site/side marked as required. Patient was prepped and draped in the usual sterile  fashion.         Follow-Up Instructions: No follow-ups on file.   Orders:  No orders of the defined types were placed in this encounter.  No orders of the defined types were placed in this encounter.   Imaging: No results found.  PMFS History: Patient Active Problem List   Diagnosis Date Noted   Chronic fatigue 03/24/2024   Generalized bloating 03/24/2024   Urge incontinence of urine 03/24/2024   Ear itching 03/24/2024   Abdominal pain, RLQ 03/24/2024   Snoring 03/24/2024   Chronic migraine w/o aura w/o status migrainosus, not intractable 12/24/2023   Hives of unknown origin 03/21/2023   Encounter for annual physical exam 03/21/2023   Painful swelling of joint 12/03/2022   Hair loss 12/03/2022   Mixed hyperlipidemia 11/27/2021   Bilateral carpal tunnel syndrome 02/15/2021   Fibroid 09/12/2020   Vitamin D  deficiency 11/11/2018   Prediabetes 11/06/2018   Fibromyalgia 05/26/2018   Sleep walking 05/15/2018   Bipolar disorder (HCC)    Pain of both hip joints 10/09/2017   Past Medical History:  Diagnosis Date   Alteration consciousness 05/26/2018   Anginal pain (HCC)    Angio-edema 12/03/2022   Bipolar disorder (HCC)    Chronic intractable headache 12/03/2022   Class 1 obesity with serious comorbidity and body mass index (BMI) of 31.0 to 31.9 in adult 11/27/2021   Depression    Dry skin 11/17/2019   Fibroid    Fibromyalgia    Finger numbness 02/15/2021   Multiple skin nodules 12/03/2022   Night sweats 11/17/2019   Numbness and tingling    Obesity (BMI 30-39.9) 11/17/2019   Pain in both wrists 02/15/2021   Paresthesia 10/09/2017   Peeling skin 12/03/2022   Prediabetes 11/06/2018   Pure hypercholesterolemia 11/17/2019   Smoker 11/17/2019    Family History  Problem Relation Age of Onset   Depression Father    Schizophrenia Father    Depression Brother    Healthy Mother    Hypertension Maternal Grandmother    Cancer Maternal Grandmother    Diabetes  Maternal Aunt     Past Surgical History:  Procedure Laterality Date   CESAREAN SECTION     CESAREAN SECTION N/A 01/19/2021   Procedure: CESAREAN SECTION;  Surgeon: Ozan, Jennifer, DO;  Location: [facility];  Service: Obstetrics;  Laterality: N/A;   wisdom teeth     Social History   Occupational History   Occupation: Unemployed  Tobacco Use   Smoking status: Some Days    Current packs/day: 0.50    Types: Cigarettes   Smokeless tobacco: Never  Vaping Use   Vaping status: Former   Substances: Nicotine, CBD, Flavoring  Substance and Sexual Activity   Alcohol use: No    Comment: quit 2014   Drug use: No   Sexual activity: Yes    Birth control/protection: None

## 2024-04-09 ENCOUNTER — Telehealth: Payer: Self-pay

## 2024-04-09 NOTE — Telephone Encounter (Signed)
 Diane from [facility] called to make aware of Impression #2 from the US  PELVIC COMPLETE WITH TRANSVAGINAL  for this patient.  IMPRESSION: 1. Uterine fibroids measuring up to 7.8 cm. 2. 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 Recommend CT pelvis with contrast for further evaluation.

## 2024-04-16 ENCOUNTER — Ambulatory Visit: Payer: Self-pay

## 2024-04-16 NOTE — Telephone Encounter (Signed)
 FYI Only or Action Required?: FYI only for provider.  Patient was last seen in primary care on 03/24/2024 by Early, Camie BRAVO, NP.  Called Nurse Triage reporting Migraine and Neurologic Problem.  Symptoms began a week ago.  Interventions attempted: Nothing.  Symptoms are: unchanged.  Triage Disposition: Go to ED Now (or PCP Triage)  Patient/caregiver understands and will follow disposition?: Yes   Copied from CRM [id number]. Topic: Clinical - Red Word Triage >> Apr 16, 2024 10:22 AM Treva T wrote: Red Word that prompted transfer to Nurse Triage:  Patient calling, states she is having a really cold senstaion, feels like water  is running down her legs, but nothing is there. More noticable in calf area.   Patient also reports she is having intense, painful headaches/migraines. Reason for Disposition  Headache  Answer Assessment - Initial Assessment Questions 1. SYMPTOM: What is the main symptom you are concerned about? (e.g., weakness, numbness)     Cold water  running sensation down knee to ankle bilateral, denies tingling, mild weakness. Also has tickling sensation in her arm 2. ONSET: When did this start? (e.g., minutes, hours, days; while sleeping)     1 week ago  3. LAST NORMAL: When was the last time you (the patient) were normal (no symptoms)?     One week ago 4. PATTERN Does this come and go, or has it been constant since it started?  Is it present now?     constant 5. CARDIAC SYMPTOMS: Have you had any of the following symptoms: chest pain, difficulty breathing, palpitations?     denies 6. NEUROLOGIC SYMPTOMS: Have you had any of the following symptoms: headache, dizziness, vision loss, double vision, changes in speech, unsteady on your feet?     Migraine, dizzy yesterday 7. OTHER SYMPTOMS: Do you have any other symptoms?     Denies  8. PREGNANCY: Is there any chance you are pregnant? When was your last menstrual period?  Protocols used: Neurologic  Deficit-A-AH

## 2024-04-23 ENCOUNTER — Other Ambulatory Visit: Payer: Self-pay | Admitting: Surgical

## 2024-05-06 ENCOUNTER — Encounter: Payer: Self-pay | Admitting: Neurology

## 2024-05-06 ENCOUNTER — Ambulatory Visit (INDEPENDENT_AMBULATORY_CARE_PROVIDER_SITE_OTHER): Admitting: Neurology

## 2024-05-06 VITALS — BP 100/70 | HR 76 | Ht 60.0 in | Wt 166.5 lb

## 2024-05-06 DIAGNOSIS — G4700 Insomnia, unspecified: Secondary | ICD-10-CM

## 2024-05-06 DIAGNOSIS — R0681 Apnea, not elsewhere classified: Secondary | ICD-10-CM

## 2024-05-06 DIAGNOSIS — R0683 Snoring: Secondary | ICD-10-CM | POA: Diagnosis not present

## 2024-05-06 DIAGNOSIS — G4719 Other hypersomnia: Secondary | ICD-10-CM

## 2024-05-06 DIAGNOSIS — R351 Nocturia: Secondary | ICD-10-CM

## 2024-05-06 DIAGNOSIS — Z820 Family history of epilepsy and other diseases of the nervous system: Secondary | ICD-10-CM

## 2024-05-06 DIAGNOSIS — R519 Headache, unspecified: Secondary | ICD-10-CM

## 2024-05-06 DIAGNOSIS — Z9189 Other specified personal risk factors, not elsewhere classified: Secondary | ICD-10-CM

## 2024-05-06 DIAGNOSIS — E66811 Obesity, class 1: Secondary | ICD-10-CM

## 2024-05-06 DIAGNOSIS — F39 Unspecified mood [affective] disorder: Secondary | ICD-10-CM

## 2024-05-06 NOTE — Progress Notes (Signed)
 Subjective:    Patient ID: Tiffany Juarez is a 43 y.o. female.  HPI    True Mar, MD, PhD [facility] [address], [address] [address] [place], [place] [address]  Dear Camie,  I saw your patient, Tiffany Juarez, upon your kind request in my sleep clinic today for initial consultation of her sleep disorder, in particular, concern for underlying obstructive sleep apnea.  The patient is unaccompanied today.  As you know, Tiffany Juarez is a 43 year old female with an underlying medical history of prediabetes, vitamin D  deficiency, hyperlipidemia, bipolar disorder, fibromyalgia, paresthesia, chronic headaches (followed by Dr. Onita in this clinic), and obesity, who reports chronic difficulty initiating and maintaining sleep, snoring and excessive daytime somnolence as well as prior issues with nocturnal cough and witnessed apneas.  She has nonrestorative sleep.  She has a family history of sleep apnea affecting her father. Her Epworth sleepiness score is 4/24, fatigue severity score is 51/63.  I reviewed your office note from 03/24/2024. She denies recurrent morning headaches.  She has nocturia about 2-3 times per average night.  She is supposed to see a urogynecologist as I understand.  Bedtime is generally around 830 and rise time is around 530.  She attributes her sleep difficulty to stress at home, she has 2 younger children, ages 67 and 1.  She reports taking care of her father who also lives with her, she also lives with her partner who works nights, and she reports that she has a child with autism.  She does not work.  She does not drink alcohol, she is working on smoking cessation but still smokes about a pack per day.  She drinks caffeine in the form of coffee, usually 1 cup/day.  Her father has a CPAP machine.  She is noted to snore and in the past when she weighed more she was noted to have apneic pauses while asleep.  She no longer has recurrent nocturnal cough.  She is working on  weight loss.  She is on multiple potentially sedating medications but reports taking most of these only as needed.  Of note, she is no longer on gabapentin , she takes hydroxyzine  as needed, she does take Latuda daily, Cymbalta  daily and BuSpar.  She takes Robaxin  as needed and also Zanaflex  as needed, she believes these 2 different muscle relaxers are from 2 different prescribers.  She takes temazepam as needed as well.  Her Past Medical History Is Significant For: Past Medical History:  Diagnosis Date   Alteration consciousness 05/26/2018   Anginal pain    Angio-edema 12/03/2022   Bipolar disorder (HCC)    Chronic intractable headache 12/03/2022   Class 1 obesity with serious comorbidity and body mass index (BMI) of 31.0 to 31.9 in adult 11/27/2021   Depression    Dry skin 11/17/2019   Fibroid    Fibromyalgia    Finger numbness 02/15/2021   Migraines    Multiple skin nodules 12/03/2022   Night sweats 11/17/2019   Numbness and tingling    Obesity (BMI 30-39.9) 11/17/2019   Pain in both wrists 02/15/2021   Paresthesia 10/09/2017   Peeling skin 12/03/2022   Prediabetes 11/06/2018   Pure hypercholesterolemia 11/17/2019   Smoker 11/17/2019   Urticaria     Her Past Surgical History Is Significant For: Past Surgical History:  Procedure Laterality Date   CARPAL TUNNEL RELEASE Right    CESAREAN SECTION     CESAREAN SECTION N/A 01/19/2021   Procedure: CESAREAN SECTION;  Surgeon:  Ozan, Jennifer, DO;  Location: [facility];  Service: Obstetrics;  Laterality: N/A;   wisdom teeth      Her Family History Is Significant For: Family History  Problem Relation Age of Onset   Healthy Mother    Depression Father    Schizophrenia Father    Sleep apnea Father    Depression Brother    Diabetes Maternal Aunt    Hypertension Maternal Grandmother    Cancer Maternal Grandmother     Her Social History Is Significant For: Social History   Socioeconomic History   Marital status: Single     Spouse name: Not on file   Number of children: 3   Years of education: 12   Highest education level: High school graduate  Occupational History   Occupation: Unemployed  Tobacco Use   Smoking status: Every Day    Current packs/day: 1.00    Average packs/day: 1 pack/day for 0.8 years (0.8 ttl pk-yrs)    Types: Cigarettes    Start date: 2025   Smokeless tobacco: Never  Vaping Use   Vaping status: Former   Substances: Nicotine, CBD, Flavoring  Substance and Sexual Activity   Alcohol use: No    Comment: quit 2014   Drug use: No   Sexual activity: Yes    Birth control/protection: None  Other Topics Concern   Not on file  Social History Narrative   Lives at home with family.   Right-handed.   2 cups caffeine per day.   Disable    Social Drivers of Corporate investment banker Strain: Low Risk  (03/24/2024)   Overall Financial Resource Strain (CARDIA)    Difficulty of Paying Living Expenses: Not very hard  Food Insecurity: No Food Insecurity (03/24/2024)   Hunger Vital Sign    Worried About Running Out of Food in the Last Year: Never true    Ran Out of Food in the Last Year: Never true  Transportation Needs: No Transportation Needs (03/24/2024)   PRAPARE - Administrator, Civil Service (Medical): No    Lack of Transportation (Non-Medical): No  Physical Activity: Insufficiently Active (03/24/2024)   Exercise Vital Sign    Days of Exercise per Week: 1 day    Minutes of Exercise per Session: 10 min  Stress: Stress Concern Present (03/24/2024)   [employer] of Occupational Health - Occupational Stress Questionnaire    Feeling of Stress: Rather much  Social Connections: Socially Isolated (03/24/2024)   Social Connection and Isolation Panel    Frequency of Communication with Friends and Family: Once a week    Frequency of Social Gatherings with Friends and Family: Once a week    Attends Religious Services: Never    Database administrator or Organizations: No    Attends  Banker Meetings: Never    Marital Status: Living with partner    Her Allergies Are:  Allergies  Allergen Reactions   Cephalosporins Anaphylaxis   Peanut-Containing Drug Products Anaphylaxis   Penicillins Anaphylaxis  :   Her Current Medications Are:  Outpatient Encounter Medications as of 05/06/2024  Medication Sig   busPIRone (BUSPAR) 15 MG tablet Take 15 mg by mouth 3 (three) times daily.   DULoxetine  (CYMBALTA ) 60 MG capsule Take 60 mg by mouth daily.   Erenumab -aooe (AIMOVIG ) 70 MG/ML SOAJ Inject 70 mg into the skin every 30 (thirty) days.   famotidine (PEPCID) 40 MG tablet Take 40 mg by mouth daily.   gabapentin  (NEURONTIN ) 100  MG capsule Take 1 capsule (100 mg total) by mouth 2 (two) times daily.   hydrOXYzine  (ATARAX ) 25 MG tablet Take 25 mg by mouth 3 times/day as needed-between meals & bedtime.   ibuprofen  (ADVIL ) 800 MG tablet Take 1 tablet (800 mg total) by mouth every 8 (eight) hours as needed.   lurasidone (LATUDA) 80 MG TABS tablet Take 80 mg by mouth daily.   meloxicam  (MOBIC ) 15 MG tablet TAKE 1 TABLET(15 MG) BY MOUTH DAILY   methocarbamol  (ROBAXIN -750) 750 MG tablet Take 1 tablet (750 mg total) by mouth every 6 (six) hours as needed (muscle pain).   mometasone (ELOCON) 0.1 % cream Apply topically.   ondansetron  (ZOFRAN -ODT) 4 MG disintegrating tablet Take 1 tablet (4 mg total) by mouth every 8 (eight) hours as needed for nausea or vomiting.   SUMAtriptan  (IMITREX ) 50 MG tablet May repeat in 2 hours if headache persists or recurs.   temazepam (RESTORIL) 15 MG capsule Take 15 mg by mouth at bedtime as needed. As needed   tiZANidine  (ZANAFLEX ) 4 MG tablet Take 1 tablet (4 mg total) by mouth every 6 (six) hours as needed for muscle spasms.   triamcinolone  cream (KENALOG ) 0.1 % Apply 1 Application topically 2 (two) times daily. Apply a small amount to a q-tip and rub right inside the ear where it is itching.   Vitamin D , Ergocalciferol , (DRISDOL ) 1.25 MG  (50000 UNIT) CAPS capsule TAKE 1 CAPSULE BY MOUTH EVERY 7 DAYS   No facility-administered encounter medications on file as of 05/06/2024.  :   Review of Systems:  Out of a complete 14 point review of systems, all are reviewed and negative with the exception of these symptoms as listed below:   Review of Systems  Neurological:        Pt here for sleep consult Pt snores,migraines,fatigue Pt denies hypertension,sleep study ,cpap machine    ESS:4 FSS:51    Objective:  Neurological Exam  Physical Exam Physical Examination:   Vitals:   05/06/24 0942  BP: 100/70  Pulse: 76    General Examination: The patient is a very pleasant 43 y.o. female in no acute distress. She appears well-developed and well-nourished and well groomed.   HEENT: Normocephalic, atraumatic, pupils are equal, round and reactive to light, extraocular tracking is good without limitation to gaze excursion or nystagmus noted. No photophobia.  Hearing is grossly intact.  Face is symmetric with normal facial animation. Speech is clear without dysarthria. There is no hypophonia. There is no lip, neck/head, jaw or voice tremor. Neck is supple with full range of passive and active motion. There are no carotid bruits on auscultation.  Airway/Oropharynx exam reveals: mild mouth dryness, good dental hygiene and mild airway crowding, due to small airway entry, Mallampati class I, tonsils on the smaller side, neck circumference 14 inches, minimal overbite.  Tongue protrudes centrally and palate symmetrically.  Chest: Clear to auscultation without wheezing, rhonchi or crackles noted.  Heart: S1+S2+0, regular and normal without murmurs, rubs or gallops noted.   Abdomen: Soft, non-tender and non-distended.  Extremities: There is no pitting edema in the distal lower extremities bilaterally.   Skin: Warm and dry without trophic changes noted.   Musculoskeletal: exam reveals no obvious joint deformities.   Neurologically:   Mental status: The patient is awake, alert and oriented in all 4 spheres. Her immediate and remote memory, attention, language skills and fund of knowledge are appropriate. There is no evidence of aphasia, agnosia, apraxia or anomia. Speech is clear  with normal prosody and enunciation. Thought process is linear. Mood is normal and affect is normal.  Cranial nerves II - XII are as described above under HEENT exam.  Motor exam: Normal bulk, strength and tone is noted. There is no obvious action or resting tremor.  Fine motor skills and coordination: Intact grossly.  Cerebellar testing: No dysmetria or intention tremor. There is no truncal or gait ataxia.  Sensory exam: intact to light touch in the upper and lower extremities.  Gait, station and balance: She stands easily. No veering to one side is noted. No leaning to one side is noted. Posture is age-appropriate and stance is narrow based. Gait shows normal stride length and normal pace. No problems turning are noted.   Assessment and Plan:  In summary, Tiffany Juarez is a very pleasant 43 y.o.-year old female with an underlying medical history of prediabetes, vitamin D  deficiency, hyperlipidemia, bipolar disorder, fibromyalgia, paresthesia, chronic headaches (followed by Dr. Onita in this clinic), and obesity, whose history and physical exam are concerning for sleep disordered breathing, particularly obstructive sleep apnea (OSA). A laboratory attended sleep study is typically considered gold standard for evaluation of sleep disordered breathing.   I had a long chat with the patient about my findings and the diagnosis of sleep apnea, particularly OSA, its prognosis and treatment options. We talked about medical/conservative treatments, surgical interventions and non-pharmacological approaches for symptom control. I explained, in particular, the risks and ramifications of untreated moderate to severe OSA, especially with respect to developing cardiovascular  disease down the road, including congestive heart failure (CHF), difficult to treat hypertension, cardiac arrhythmias (particularly A-fib), neurovascular complications including TIA, stroke and dementia. Even type 2 diabetes has, in part, been linked to untreated OSA. Symptoms of untreated OSA may include (but may not be limited to) daytime sleepiness, nocturia (i.e. frequent nighttime urination), memory problems, mood irritability and suboptimally controlled or worsening mood disorder such as depression and/or anxiety, lack of energy, lack of motivation, physical discomfort, as well as recurrent headaches, especially morning or nocturnal headaches. We talked about the importance of maintaining a healthy lifestyle and striving for healthy weight.  The importance of complete smoking cessation was also addressed.  In addition, we talked about the importance of striving for and maintaining good sleep hygiene. I recommended a sleep study at this time. I outlined the differences between a laboratory attended sleep study which is considered more comprehensive and accurate over the option of a home sleep test (HST); the latter may lead to underestimation of sleep disordered breathing in some instances and does not help with diagnosing upper airway resistance syndrome and is not accurate enough to diagnose primary central sleep apnea typically. I outlined possible surgical and non-surgical treatment options of OSA, including the use of a positive airway pressure (PAP) device (i.e. CPAP, AutoPAP/APAP or BiPAP in certain circumstances), a custom-made dental device (aka oral appliance, which would require a referral to a specialist dentist or orthodontist typically, and is generally speaking not considered for patients with full dentures or edentulous state), upper airway surgical options, such as traditional UPPP (which is not considered a first-line treatment) or the Inspire device (hypoglossal nerve stimulator, which  would involve a referral for consultation with an ENT surgeon, after careful selection, following inclusion criteria - also not first-line treatment). I explained the PAP treatment option to the patient in detail, as this is generally considered first-line treatment.  The patient indicated that she would be willing to try PAP therapy, if the  need arises. I explained the importance of being compliant with PAP treatment, not only for insurance purposes but primarily to improve patient's symptoms symptoms, and for the patient's long term health benefit, including to reduce Her cardiovascular risks longer-term.    We will pick up our discussion about the next steps and treatment options after testing.  We will keep her posted as to the test results by phone call and/or MyChart messaging where possible.  We will plan to follow-up in sleep clinic accordingly as well.  I answered all her questions today and the patient was in agreement.   I encouraged her to call with any interim questions, concerns, problems or updates or email us  through MyChart.  Generally speaking, sleep test authorizations may take up to 2 weeks, sometimes less, sometimes longer, the patient is encouraged to get in touch with us  if they do not hear back from the sleep lab staff directly within the next 2 weeks.  Thank you very much for allowing me to participate in the care of this nice patient. If I can be of any further assistance to you please do not hesitate to call me at [phone].  Sincerely,   True Mar, MD, PhD

## 2024-05-06 NOTE — Patient Instructions (Addendum)

## 2024-05-07 DIAGNOSIS — F3131 Bipolar disorder, current episode depressed, mild: Secondary | ICD-10-CM | POA: Diagnosis not present

## 2024-05-13 ENCOUNTER — Ambulatory Visit: Admitting: Family Medicine

## 2024-05-21 DIAGNOSIS — F3131 Bipolar disorder, current episode depressed, mild: Secondary | ICD-10-CM | POA: Diagnosis not present

## 2024-05-25 ENCOUNTER — Encounter: Payer: Self-pay | Admitting: Radiology

## 2024-05-26 DIAGNOSIS — F3131 Bipolar disorder, current episode depressed, mild: Secondary | ICD-10-CM | POA: Diagnosis not present

## 2024-06-09 DIAGNOSIS — H5203 Hypermetropia, bilateral: Secondary | ICD-10-CM | POA: Diagnosis not present

## 2024-06-11 DIAGNOSIS — F3131 Bipolar disorder, current episode depressed, mild: Secondary | ICD-10-CM | POA: Diagnosis not present

## 2024-06-15 DIAGNOSIS — F3131 Bipolar disorder, current episode depressed, mild: Secondary | ICD-10-CM | POA: Diagnosis not present

## 2024-06-30 ENCOUNTER — Ambulatory Visit: Admitting: Neurology

## 2024-06-30 ENCOUNTER — Telehealth: Payer: Self-pay | Admitting: Neurology

## 2024-06-30 NOTE — Telephone Encounter (Signed)
 Pt cx, she is sick

## 2024-06-30 NOTE — Telephone Encounter (Signed)
 Pt has called back and r/s appointment with wait list

## 2024-07-03 ENCOUNTER — Ambulatory Visit: Admitting: Obstetrics

## 2024-07-28 ENCOUNTER — Ambulatory Visit: Admitting: Obstetrics and Gynecology

## 2024-07-28 ENCOUNTER — Encounter: Payer: Self-pay | Admitting: Obstetrics and Gynecology

## 2024-07-28 VITALS — BP 126/84 | HR 88 | Ht 59.3 in | Wt 159.0 lb

## 2024-07-28 DIAGNOSIS — R350 Frequency of micturition: Secondary | ICD-10-CM | POA: Diagnosis not present

## 2024-07-28 DIAGNOSIS — N3281 Overactive bladder: Secondary | ICD-10-CM | POA: Insufficient documentation

## 2024-07-28 DIAGNOSIS — N8110 Cystocele, unspecified: Secondary | ICD-10-CM | POA: Insufficient documentation

## 2024-07-28 DIAGNOSIS — M62838 Other muscle spasm: Secondary | ICD-10-CM | POA: Insufficient documentation

## 2024-07-28 LAB — POCT URINALYSIS DIP (CLINITEK)
Bilirubin, UA: NEGATIVE
Blood, UA: NEGATIVE
Glucose, UA: NEGATIVE mg/dL
Ketones, POC UA: NEGATIVE mg/dL
Leukocytes, UA: NEGATIVE
Nitrite, UA: NEGATIVE
POC PROTEIN,UA: NEGATIVE
Spec Grav, UA: 1.025
Urobilinogen, UA: 0.2 U/dL
pH, UA: 5.5

## 2024-07-28 MED ORDER — TROSPIUM CHLORIDE 20 MG PO TABS: 20.0000 mg | ORAL_TABLET | Freq: Two times a day (BID) | ORAL | 5 refills | Status: AC

## 2024-07-28 NOTE — Patient Instructions (Signed)
Today we talked about ways to manage bladder urgency such as altering your diet to avoid irritative beverages and foods (bladder diet) as well as attempting to decrease stress and other exacerbating factors. Avoid drinking 2-3 hours prior to bedtime.   The Most Bothersome Foods* The Least Bothersome Foods*  Coffee - Regular & Decaf Tea - caffeinated Carbonated beverages - cola, non-colas, diet & caffeine-free Alcohols - Beer, Red Wine, White Wine, Champagne Fruits - Grapefruit, [place], Orange, [employer] - Cranberry, Grapefruit, Orange, Pineapple Vegetables - Tomato & Tomato Products Flavor Enhancers - Hot peppers, Spicy foods, Chili, Horseradish, Vinegar, Monosodium glutamate (MSG) Artificial Sweeteners - NutraSweet, Sweet 'N Low, Equal (sweetener), Saccharin Ethnic foods - [place], [place], [place] food [employer] - low-fat & whole Fruits - Bananas, Blueberries, Honeydew melon, Pears, Raisins, Watermelon Vegetables - Broccoli, Brussels Sprouts, [place], Carrots, Cauliflower, [place], Cucumber, Mushrooms, Peas, Radishes, Squash, Zucchini, White potatoes, Sweet potatoes & yams Poultry - Chicken, Eggs, [place], [employer] - Beef, Programmer, multimedia, Lamb Seafood - Shrimp, [place] fish, Salmon Grains - Oat, Rice Snacks - Pretzels, Popcorn  *Lissa Morales et al. Diet and its role in interstitial cystitis/bladder pain syndrome (IC/BPS) and comorbid conditions. [place] 2012 Jan 11.

## 2024-07-28 NOTE — Assessment & Plan Note (Signed)
-   The origin of pelvic floor muscle spasm can be multifactorial, including primary, reactive to a different pain source, trauma, or even part of a centralized pain syndrome.Treatment options include pelvic floor physical therapy, local (vaginal) or oral  muscle relaxants, pelvic muscle trigger point injections or centrally acting pain medications.   - Referral placed to pelvic PT - Advised to take her methocarbamol  as needed to help with muscle tension.

## 2024-07-28 NOTE — Assessment & Plan Note (Signed)
 We discussed the symptoms of overactive bladder (OAB), which include urinary urgency, urinary frequency, nocturia, with or without urge incontinence.  While we do not know the exact etiology of OAB, several treatment options exist. We discussed management including behavioral therapy (decreasing bladder irritants, urge suppression strategies, timed voids, bladder retraining), physical therapy, medication; for refractory cases posterior tibial nerve stimulation, sacral neuromodulation, and intravesical botulinum toxin injection.  - prescribed trospium  20mg  BID. For anticholinergic medications, we discussed the potential side effects of anticholinergics including dry eyes, dry mouth, constipation, cognitive impairment and urinary retention. - Recommended increasing water  intake and decreasing caffeine and other bladder irritants - Advised to proceed with sleep study as untreated sleep apnea may be contributing to nocturia.

## 2024-07-28 NOTE — Assessment & Plan Note (Signed)
 Stage II anterior, Stage 0 posterior, Stage 0 apical prolapse - For treatment of pelvic organ prolapse, we discussed options for management including expectant management, conservative management, and surgical management, such as Kegels, a pessary, pelvic floor physical therapy, and specific surgical procedures. - She will start with pelvic PT
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
## 2024-07-30 ENCOUNTER — Ambulatory Visit: Admitting: Family Medicine

## 2024-07-30 VITALS — BP 120/79 | HR 75 | Ht 60.0 in | Wt 161.0 lb

## 2024-07-30 DIAGNOSIS — Z1331 Encounter for screening for depression: Secondary | ICD-10-CM

## 2024-07-30 DIAGNOSIS — Z3046 Encounter for surveillance of implantable subdermal contraceptive: Secondary | ICD-10-CM

## 2024-07-30 DIAGNOSIS — Z01419 Encounter for gynecological examination (general) (routine) without abnormal findings: Secondary | ICD-10-CM

## 2024-07-30 DIAGNOSIS — Z3202 Encounter for pregnancy test, result negative: Secondary | ICD-10-CM

## 2024-07-30 LAB — POCT URINE PREGNANCY: Preg Test, Ur: NEGATIVE

## 2024-07-30 MED ORDER — ETONOGESTREL 68 MG ~~LOC~~ IMPL
68.0000 mg | DRUG_IMPLANT | Freq: Once | SUBCUTANEOUS | Status: AC
  Administered 2024-07-30: 68 mg via SUBCUTANEOUS

## 2024-07-30 NOTE — Progress Notes (Signed)
 Patient presents for Annual.  LMP: No LMP recorded.  Last pap: Date: 05/09/2023 Contraception: None/ nexplanon   Mammogram: Up to date: 06/17/2023 STD Screening: Declines Flu Vaccine : Declines  CC:   Fun Fact: wants to discuss nexplanon  and needs a few smiles its been a rough 8 days

## 2024-07-31 NOTE — Progress Notes (Signed)
 "  ANNUAL EXAM Patient name: Tiffany Juarez MRN [id number]  Date of birth: [birth year] Chief Complaint:   Annual Exam  History of Present Illness:   Tiffany Juarez is a 44 y.o.  [phone]  female  being seen today for a routine annual exam.  Current complaints: normal menses. Would like nexplanon  for contraception.  Patient's last menstrual period was 07/19/2024 (exact date).    Last pap 04/2023. Results were: NILM w/ HRHPV negative. H/O abnormal pap: no Last mammogram: 2024. Results were: normal. Family h/o breast cancer: no Last colonoscopy: n/a     07/30/2024   10:19 AM 03/24/2024    9:44 AM 11/26/2023   10:17 AM 05/09/2023   10:18 AM 03/21/2023    9:12 AM  Depression screen PHQ 2/9  Decreased Interest 1 0 0 0 0  Down, Depressed, Hopeless 2 1 1  0 0  PHQ - 2 Score 3 1 1  0 0  Altered sleeping 3  2 2    Tired, decreased energy 3  3 2    Change in appetite 3  0 2   Feeling bad or failure about yourself  2  2 0   Trouble concentrating 2  3 2    Moving slowly or fidgety/restless 0  0 0   Suicidal thoughts 1  0 0   PHQ-9 Score 17  11  8     Difficult doing work/chores   Somewhat difficult       Data saved with a previous flowsheet row definition        07/30/2024   10:19 AM 05/09/2023   10:18 AM 11/13/2017   11:04 AM 06/17/2017   10:08 AM  GAD 7 : Generalized Anxiety Score  Nervous, Anxious, on Edge 2 3 0 3  Control/stop worrying 3 1 0 3  Worry too much - different things 3 2 0 3  Trouble relaxing 3 2 1 3   Restless 3 3 1 1   Easily annoyed or irritable 3 3 1 3   Afraid - awful might happen 3 0 0 3  Total GAD 7 Score 20 14 3 19      Review of Systems:   Pertinent items are noted in HPI Denies any headaches, blurred vision, fatigue, shortness of breath, chest pain, abdominal pain, abnormal vaginal discharge/itching/odor/irritation, problems with periods, bowel movements, urination, or intercourse unless otherwise stated above. Pertinent History Reviewed:  Reviewed past medical,surgical,  social and family history.  Reviewed problem list, medications and allergies. Physical Assessment:   Vitals:   07/30/24 0925  BP: 120/79  Pulse: 75  Weight: 161 lb (73 kg)  Height: 5' (1.524 m)  Body mass index is 31.44 kg/m.        Physical Examination:   General appearance - well appearing, and in no distress  Mental status - alert, oriented to person, place, and time  Psych:  She has a normal mood and affect  Skin - warm and dry, normal color, no suspicious lesions noted  Chest - effort normal, all lung fields clear to auscultation bilaterally  Heart - normal rate and regular rhythm  Neck:  midline trachea, no thyromegaly or nodules  Breasts - breasts appear normal, no suspicious masses, no skin or nipple changes or axillary nodes  Abdomen - soft, nontender, nondistended, no masses or organomegaly  Pelvic - VULVA: normal appearing vulva with no masses, tenderness or lesions  VAGINA: normal appearing vagina with normal color and discharge, no lesions  CERVIX: normal appearing cervix without discharge or lesions, no CMT  Thin  prep pap is not done   UTERUS: uterus is felt to be normal size, shape, consistency and nontender   ADNEXA: No adnexal masses or tenderness noted.  Extremities:  No swelling or varicosities noted  Chaperone present for exam  Nexplanon  Insertion:  Patient given informed consent, signed copy in the chart, time out was performed. Pregnancy test was negative. Appropriate time out taken.  Patient's left arm was prepped and draped in the usual sterile fashion.. The ruler used to measure and mark insertion area.  Pt was prepped with alcohol swab and then injected with 5 cc of 2% lidocaine  with epinephrine .  Pt was prepped with betadine, Implanon  removed form packaging,  Device confirmed in needle, then inserted full length of needle and withdrawn per handbook instructions.  Device palpated by physician and patient.  Pt insertion site covered with pressure dressing.    Minimal blood loss.  Pt tolerated the procedure well.   Assessment & Plan:  1. Well woman exam with routine gynecological exam (Primary) - MM 3D SCREENING MAMMOGRAM BILATERAL BREAST; Future  2. Encounter for insertion of Nexplanon  subdermal contraceptive Discussed the potential breakthrough bleeding - etonogestrel  (NEXPLANON ) implant 68 mg  3. Encounter for pregnancy test with result negative - POCT urine pregnancy  4. Positive screening for depression on 9-item Patient Health Questionnaire (PHQ-9) - Amb ref to [facility]   Labs/procedures today:   Orders Placed This Encounter  Procedures   MM 3D SCREENING MAMMOGRAM BILATERAL BREAST   Amb ref to [facility]   POCT urine pregnancy    Meds:  Meds ordered this encounter  Medications   etonogestrel  (NEXPLANON ) implant 68 mg    Follow-up: No follow-ups on file.  Kamy Poinsett J Brettany Sydney, DO 07/31/2024 9:35 PM  "

## 2024-08-13 ENCOUNTER — Ambulatory Visit (HOSPITAL_BASED_OUTPATIENT_CLINIC_OR_DEPARTMENT_OTHER)
Admission: RE | Admit: 2024-08-13 | Discharge: 2024-08-13 | Disposition: A | Discharge status: Home / Self Care | Source: Ambulatory Visit | Attending: Family Medicine | Admitting: Family Medicine

## 2024-08-13 ENCOUNTER — Encounter (HOSPITAL_BASED_OUTPATIENT_CLINIC_OR_DEPARTMENT_OTHER): Payer: Self-pay

## 2024-08-13 DIAGNOSIS — Z1231 Encounter for screening mammogram for malignant neoplasm of breast: Secondary | ICD-10-CM | POA: Diagnosis not present

## 2024-08-13 DIAGNOSIS — Z01419 Encounter for gynecological examination (general) (routine) without abnormal findings: Secondary | ICD-10-CM | POA: Diagnosis present

## 2024-08-18 ENCOUNTER — Ambulatory Visit: Payer: Self-pay | Admitting: Family Medicine

## 2024-08-28 ENCOUNTER — Other Ambulatory Visit: Payer: Self-pay

## 2024-08-28 ENCOUNTER — Ambulatory Visit: Admitting: Surgical

## 2024-08-28 DIAGNOSIS — M7532 Calcific tendinitis of left shoulder: Secondary | ICD-10-CM

## 2024-08-28 DIAGNOSIS — G8929 Other chronic pain: Secondary | ICD-10-CM

## 2024-09-14 ENCOUNTER — Ambulatory Visit: Admitting: Obstetrics and Gynecology

## 2024-10-01 ENCOUNTER — Ambulatory Visit: Admitting: Physical Therapy

## 2024-11-05 ENCOUNTER — Ambulatory Visit: Admitting: Neurology

## 2024-12-08 ENCOUNTER — Ambulatory Visit: Payer: Self-pay

## 2025-04-12 ENCOUNTER — Encounter: Payer: Self-pay | Admitting: Nurse Practitioner
